# Patient Record
Sex: Female | Born: 1950 | Race: White | Hispanic: No | Smoking: Never smoker
Health system: Southern US, Community
[De-identification: ages and names within clinical notes are randomized; demographics above are authoritative.]

## PROBLEM LIST (undated history)

## (undated) DIAGNOSIS — M25562 Pain in left knee: Secondary | ICD-10-CM

## (undated) DIAGNOSIS — J449 Chronic obstructive pulmonary disease, unspecified: Secondary | ICD-10-CM

## (undated) DIAGNOSIS — R002 Palpitations: Secondary | ICD-10-CM

## (undated) DIAGNOSIS — J302 Other seasonal allergic rhinitis: Secondary | ICD-10-CM

## (undated) DIAGNOSIS — R600 Localized edema: Secondary | ICD-10-CM

## (undated) DIAGNOSIS — M1990 Unspecified osteoarthritis, unspecified site: Secondary | ICD-10-CM

## (undated) DIAGNOSIS — Z8669 Personal history of other diseases of the nervous system and sense organs: Secondary | ICD-10-CM

## (undated) DIAGNOSIS — K219 Gastro-esophageal reflux disease without esophagitis: Secondary | ICD-10-CM

## (undated) DIAGNOSIS — R51 Headache: Secondary | ICD-10-CM

## (undated) DIAGNOSIS — M25561 Pain in right knee: Secondary | ICD-10-CM

## (undated) DIAGNOSIS — Z87898 Personal history of other specified conditions: Secondary | ICD-10-CM

## (undated) DIAGNOSIS — G8929 Other chronic pain: Secondary | ICD-10-CM

## (undated) DIAGNOSIS — I639 Cerebral infarction, unspecified: Secondary | ICD-10-CM

## (undated) DIAGNOSIS — I83893 Varicose veins of bilateral lower extremities with other complications: Secondary | ICD-10-CM

## (undated) DIAGNOSIS — G4733 Obstructive sleep apnea (adult) (pediatric): Secondary | ICD-10-CM

## (undated) DIAGNOSIS — I10 Essential (primary) hypertension: Secondary | ICD-10-CM

## (undated) DIAGNOSIS — I712 Thoracic aortic aneurysm, without rupture: Secondary | ICD-10-CM

## (undated) DIAGNOSIS — F419 Anxiety disorder, unspecified: Secondary | ICD-10-CM

## (undated) DIAGNOSIS — M549 Dorsalgia, unspecified: Secondary | ICD-10-CM

## (undated) HISTORY — DX: Palpitations: R00.2

## (undated) HISTORY — DX: Pain in left knee: M25.562

## (undated) HISTORY — DX: Personal history of other specified conditions: Z87.898

## (undated) HISTORY — PX: ABDOMINAL HYSTERECTOMY: SHX81

## (undated) HISTORY — DX: Personal history of other diseases of the nervous system and sense organs: Z86.69

## (undated) HISTORY — PX: BACK SURGERY: SHX140

## (undated) HISTORY — DX: Varicose veins of bilateral lower extremities with other complications: I83.893

## (undated) HISTORY — DX: Other seasonal allergic rhinitis: J30.2

## (undated) HISTORY — PX: UMBILICAL HERNIA REPAIR: SHX196

## (undated) HISTORY — DX: Thoracic aortic aneurysm, without rupture: I71.2

## (undated) HISTORY — PX: CHOLECYSTECTOMY: SHX55

## (undated) HISTORY — DX: Pain in right knee: M25.561

## (undated) HISTORY — PX: APPENDECTOMY: SHX54

---

## 2004-05-15 ENCOUNTER — Emergency Department (HOSPITAL_COMMUNITY): Admission: EM | Admit: 2004-05-15 | Discharge: 2004-05-15 | Payer: Self-pay | Admitting: Emergency Medicine

## 2004-09-30 ENCOUNTER — Ambulatory Visit (HOSPITAL_COMMUNITY): Admission: RE | Admit: 2004-09-30 | Discharge: 2004-09-30 | Payer: Self-pay | Admitting: General Surgery

## 2004-10-11 ENCOUNTER — Inpatient Hospital Stay (HOSPITAL_COMMUNITY): Admission: RE | Admit: 2004-10-11 | Discharge: 2004-10-13 | Payer: Self-pay | Admitting: General Surgery

## 2007-09-19 HISTORY — PX: HERNIA REPAIR: SHX51

## 2007-11-25 ENCOUNTER — Inpatient Hospital Stay (HOSPITAL_COMMUNITY): Admission: RE | Admit: 2007-11-25 | Discharge: 2007-11-27 | Payer: Self-pay | Admitting: General Surgery

## 2011-01-31 NOTE — Op Note (Signed)
[name], Sabrina Curry                ACCOUNT NO.:  [id number]   MEDICAL RECORD NO.:  [id number]          PATIENT TYPE:  AMB   LOCATION:  DAY                           FACILITY:  [facility]   PHYSICIAN:  Jamesetta So, M.D.  DATE OF BIRTH:  1951-05-01   DATE OF PROCEDURE:  11/25/2007  DATE OF DISCHARGE:                               OPERATIVE REPORT   PREOPERATIVE DIAGNOSIS:  Recurrent incisional hernia.   POSTOPERATIVE DIAGNOSIS:  Recurrent incisional hernia.   PROCEDURE:  Recurrent incisional herniorrhaphy with mesh.   SURGEON:  Dr. Aviva Signs.   ANESTHESIA:  General endotracheal.   INDICATIONS:  The patient is a 60 year old white female who has sleep  apnea and morbid obesity who presents with recurrent incisional hernia.  The risks and benefits of the procedure including bleeding, infection,  cardiopulmonary difficulties, the possibly of recurrence of the hernia  were fully explained to the patient, who gave informed consent.   PROCEDURE NOTE:  The patient was placed in supine position.  After  induction of general endotracheal anesthesia, the abdomen was prepped  and draped in the usual sterile technique with Betadine.  Surgical site  confirmation was performed.   An upper midline incision was made through the previous surgical scar.  This was taken down to the mesh.  The patient was noted to have two  areas of herniation along the left lateral aspect of the mesh.  The old  polypropylene mesh was removed.  A single loop of small bowel was noted  adherent to this.  This was freed away sharply without difficulty.  Small serosal tear was then repaired using a 3-0 silk suture.  The  defect itself measured approximately 10 x 8 cm at its greatest diameter.  A 10 x 15 cm Proceed mesh was then placed.  2-0 and 0 Novofil tacking  sutures were placed into the mesh.  Interrupted 0 Novofil sutures were  then placed to secure the fascia over the mesh.  The subcutaneous layer  was  reapproximated using a 2-0 Vicryl interrupted suture.  The skin was  closed using staples.  Betadine ointment dry sterile dressings were  applied.  0.5 cm Sensorcaine was instilled in the surrounding wound.  An  abdominal binder was then applied.   All tape and needle counts were correct at the end of the procedure.  The patient was extubated in the operating room and went back to  recovery room awake in stable condition.  Complications none.   SPECIMEN:  None.   BLOOD LOSS:  Minimal.      Jamesetta So, M.D.  Electronically Signed     MAJ/MEDQ  D:  11/25/2007  T:  11/26/2007  Job:  [id number]   cc:   Stoney Bang  Fax: [phone]

## 2011-01-31 NOTE — Discharge Summary (Signed)
NAMECERA, RORKE                ACCOUNT NO.:  [id number]   MEDICAL RECORD NO.:  [id number]          PATIENT TYPE:  INP   LOCATION:  A302                          FACILITY:  [facility]   PHYSICIAN:  Jamesetta So, M.D.  DATE OF BIRTH:  1950-11-02   DATE OF ADMISSION:  11/25/2007  DATE OF DISCHARGE:  03/11/2009LH                               [place] COURSE SUMMARY:  The patient is a 60 year old white female who  presented to the operating room for repair of a recurrent incisional  hernia.  She underwent a recurrent incisional herniorrhaphy with mesh on  November 25, 2007.  She tolerated the procedure well.  Her postoperative  course was remarkable for some nausea and vomiting.  She thus was  admitted for treatment of this.  Her nausea and vomiting have  subsequently resolved and she is being discharged home on November 27, 2007, in good and improving condition.   DISCHARGE INSTRUCTIONS:  The patient is to follow up Dr. Aviva Signs on  December 05, 2007.   DISCHARGE MEDICATIONS:  1. Percocet 7.5/325 mg 1-2 tablets p.o. q.4 h. p.r.n. pain.  2. Metformin 500 mg p.o. b.i.d.  3. Xanax 1 mg p.o. t.i.d.  4. Albuterol inhalers as needed.  5. Amlodipine/benazepril 10/20 mg p.o. daily.  6. Morphine sulfate 30 mg p.o. b.i.d.  7. Omeprazole 20 mg p.o. b.i.d.   PRINCIPAL DIAGNOSES:  1. Recurrent incisional hernia.  2. Morbid obesity.  3. Chronic obstructive pulmonary disease.  4. Non-insulin-dependent diabetes mellitus.  5. Hypertension.   PRINCIPAL PROCEDURE:  Recurrent incisional herniorrhaphy with mesh on  November 25, 2007.      Jamesetta So, M.D.  Electronically Signed    MAJ/MEDQ  D:  11/27/2007  T:  11/28/2007  Job:  [id number]   cc:   Stoney Bang  Fax: [phone]

## 2011-02-03 NOTE — Op Note (Signed)
Sabrina Curry, Sabrina Curry                ACCOUNT NO.:  [id number]   MEDICAL RECORD NO.:  [id number]          PATIENT TYPE:  AMB   LOCATION:  DAY                           FACILITY:  [facility]   PHYSICIAN:  Jamesetta So, M.D.  DATE OF BIRTH:  18-Oct-1950   DATE OF PROCEDURE:  10/10/2004  DATE OF DISCHARGE:                                 OPERATIVE REPORT   PREOPERATIVE DIAGNOSIS:  Incisional hernia.   POSTOPERATIVE DIAGNOSIS:  Incisional hernia.   PROCEDURE:  Incisional herniorrhaphy with mesh.   SURGEON:  Dr. Aviva Signs   ANESTHESIA:  General endotracheal.   INDICATIONS:  The patient is a 60 year old morbidly obese white female with  multiple medical problems who has had multiple abdominal surgeries in the  past. She now presents with a painful incisional hernia above the umbilicus.  Risks and benefits of procedure including bleeding, infection, and  recurrence of hernia were fully explained to the patient, who gave informed  consent.   PROCEDURE NOTE:  The patient was placed in supine position. After induction  of general endotracheal anesthesia, the abdomen was prepped and draped using  the usual sterile technique with Betadine. Surgical site confirmation was  performed.   The right paramedian supraumbilical incision was made. This was made through  the previous surgical scar. This was taken down to the fascia. Multiple  small hernia defects were noted. These were all made into one. This then  made the hernia proximately 6-7 cm in its greatest diameter. Omentum was  present just deep to the mesh. Polypropylene mesh was then used for the  herniorrhaphy.  It was secured circumferentially to the fascia using a 0  Prolene running suture. Multiple stay sutures of 0 Prolene were also placed.  The subcutaneous layer was reapproximated using a 2-0 Vicryl interrupted  suture. The skin was closed using staples. 0.5% Sensorcaine was instilled in  the surrounding wound. Betadine ointment, dry  sterile dressing were applied.   All table and needle counts correct and the procedure. The patient was  extubated in the upper room and went back to recovery room awake in stable  condition.   COMPLICATIONS:  None.   SPECIMEN:  None.   BLOOD LOSS:  Minimal.                                               ______________________________  Jamesetta So, M.D.  Electronically Signed 10/10/2004 15:10:31 EST    MAJ/MEDQ  D:  10/10/2004  T:  10/10/2004  Job:  [id number]

## 2011-02-03 NOTE — H&P (Signed)
Sabrina Curry, Sabrina Curry                ACCOUNT NO.:  [id number]   MEDICAL RECORD NO.:  [id number]          PATIENT TYPE:  AMB   LOCATION:  DAY                           FACILITY:  [facility]   PHYSICIAN:  Jamesetta So, M.D.  DATE OF BIRTH:  June 15, 1951   DATE OF ADMISSION:  11/25/2007  DATE OF DISCHARGE:  LH                              HISTORY & PHYSICAL   CHIEF COMPLAINT:  Recurrent incisional hernia.   HISTORY OF PRESENT ILLNESS:  The patient is a 60 year old white female  who has had multiple abdominal surgeries in the past, who now presents  with a recurrent incisional hernia superior to the umbilicus.  She has  had increasing pain in this region.  The hernia is made worse with  straining.  No nausea or vomiting have been noted.   PAST MEDICAL HISTORY:  1. Sleep apnea.  2. COPD.  3. Noninsulin-dependent diabetes mellitus.  4. Hypertension.  5. Morbid obesity.   PAST SURGICAL HISTORY:  1. Multiple incisional herniorrhaphies.  2. Cholecystectomy.  3. Back surgery.  4. Hysterectomy.  5. Small bowel injury due to a laparoscopy in the past.   CURRENT MEDICATIONS:  Metformin, albuterol inhaler, CPAP machine,  hydrochlorothiazide, amlodipine, hydrocodone and morphine for chronic  pain.   ALLERGIES:  VANCOMYCIN.   REVIEW OF SYSTEMS:  Noncontributory.   PHYSICAL EXAMINATION:  The patient is an obese white female in no acute  distress.  LUNGS:  Clear to auscultation with equal breath sounds bilaterally.  HEART:  A regular rate and rhythm without S3, S4 or murmurs.  ABDOMEN:  Large with a large supraumbilical hernia present extending  from the midline to the left, the previous incisional hernia.  It is  reducible.   IMPRESSION:  Recurrent incisional hernia.   PLAN:  The patient is scheduled for a recurrent incisional herniorrhaphy  with mesh on November 25, 2007.  The risks and benefits of the procedure  including bleeding, infection, pain, cardiopulmonary difficulties, and  the  possibility of recurrence of the hernia were fully explained to the  patient, who gave informed consent.      Jamesetta So, M.D.  Electronically Signed     MAJ/MEDQ  D:  11/07/2007  T:  11/08/2007  Job:  [id number]   cc:   [facility]  Fax: [place]  Fax: [phone]

## 2011-02-03 NOTE — Discharge Summary (Signed)
Sabrina Curry, Sabrina Curry                ACCOUNT NO.:  [id number]   MEDICAL RECORD NO.:  [id number]          PATIENT TYPE:  INP   LOCATION:  A311                          FACILITY:  [facility]   PHYSICIAN:  Jamesetta So, M.D.  DATE OF BIRTH:  01-27-1951   DATE OF ADMISSION:  10/10/2004  DATE OF DISCHARGE:  01/26/2006LH                                 [place] COURSE SUMMARY:  The patient is a 60 year old white female with  multiple medical problems who presented with an incisional hernia.  She  underwent an incisional herniorrhaphy with mesh on October 10, 2004.  She  tolerated the procedure well.  Her postoperative course was remarkable for  relative hypotension, given her preoperative hypertensive state.  Her blood  pressure medications were held.  A MET-7 and CBC were within normal limits.  A 12-lead EKG was within normal limits.  Her diet was advanced without  difficulty.  It was felt that her blood pressure was somewhat low due to  postoperative nausea, which has since resolved.  The patient is being  discharged home in good and improving condition.   DISCHARGE INSTRUCTIONS:  The patient is to follow up with Dr. Aviva Signs  on October 20, 2004.   DISCHARGE MEDICATIONS:  Include:  1.  Lortab 10/650 one tablet p.o. q.4h. p.r.n. pain.  2.  She is to resume all of her other medications except for her blood      pressure medications as prescribed.   PRINCIPAL DIAGNOSES:  1.  Incisional hernia.  2.  Non-insulin dependent diabetes mellitus.  3.  Hypertension.  4.  Morbid obesity.  5.  Anxiety.   PRINCIPAL PROCEDURE:  Incisional herniorrhaphy with mesh on October 10, 2004.      MAJ/MEDQ  D:  10/13/2004  T:  10/13/2004  Job:  [id number]

## 2011-02-03 NOTE — H&P (Signed)
NAMEAFNAN, CADIENTE                ACCOUNT NO.:  [id number]   MEDICAL RECORD NO.:  [id number]           PATIENT TYPE:   LOCATION:                                FACILITY:  [facility]   PHYSICIAN:  Jamesetta So, M.D.  DATE OF BIRTH:  Jul 23, 1951   DATE OF ADMISSION:  DATE OF DISCHARGE:  LH                                HISTORY & PHYSICAL   CHIEF COMPLAINT:  Ventral/incisional hernia.   HISTORY OF PRESENT ILLNESS:  The patient is a 60 year old white female who  has had multiple abdominal surgeries in the past who now presents with a  ventral hernia just superior to the umbilicus.  This is in close proximity  to a previous midline incision.  She has had increasing pain in this region.  A CT scan of the abdomen revealed a 3-cm fascial defect in this region.  It  is about 4- to -5-cm above the umbilicus and to the right of the midline.   PAST MEDICAL HISTORY:  1.  COPD.  2.  Sleep apnea.  3.  Non-insulin-dependent diabetes mellitus.  4.  Hypertension.  5.  Morbid obesity.   PAST SURGICAL HISTORY:  1.  Cholecystectomy.  2.  Multiple herniorrhaphies.  3.  Back surgery.  4.  Hysterectomy.  5.  Small bowel injury due to a laparoscopy.   CURRENT MEDICATIONS:  Alprazolam, albuterol inhaler, methadone, Lorcet,  Flonase, Prevacid, isosorbide, Norvasc, atenolol, Zyrtec, furosemide,  Altace, Metformin, Hyzaar, Singulair, clonidine.   ALLERGIES:  VANCOMYCIN.   REVIEW OF SYSTEMS:  The patient denies drinking or smoking.   PHYSICAL EXAMINATION:  GENERAL:  The patient is an obese white female in no  acute distress.  VITAL SIGNS:  She is afebrile and vital signs are stable.  LUNGS:  Clear to auscultation with equal breath sounds bilaterally.  HEART:  Reveals a regular rate and rhythm without S3, S4, or murmurs.  ABDOMEN:  Soft with nonspecific tenderness noted superior to the umbilicus.  A specific hernia defect can not be appreciated due to her obesity.  No  hepatosplenomegaly or masses  noted.   IMPRESSION:  Ventral/incisional hernia.   PLAN:  The patient is scheduled to undergo a ventral/incisional  herniorrhaphy with mesh on October 10, 2004.  The risks and benefits of the  procedure including bleeding, infection, and the possibility of recurrence  of the hernia were fully explained to the patient.  Gave informed consent.      MAJ/MEDQ  D:  10/04/2004  T:  10/04/2004  Job:  [id number]   cc:   [facility]  Fax: [phone]

## 2011-02-03 NOTE — H&P (Signed)
Sabrina Curry, Sabrina Curry                ACCOUNT NO.:  [id number]   MEDICAL RECORD NO.:  [id number]          PATIENT TYPE:  AMB   LOCATION:  DAY                           FACILITY:  [facility]   PHYSICIAN:  Jamesetta So, M.D.  DATE OF BIRTH:  Dec 03, 1950   DATE OF ADMISSION:  11/25/2007  DATE OF DISCHARGE:  LH                              HISTORY & PHYSICAL   CHIEF COMPLAINT:  Recurrent incisional hernia.   HISTORY OF PRESENT ILLNESS:  The patient is a 60 year old white female  who has had multiple abdominal surgeries in the past, who now presents  with a recurrent incisional hernia superior to the umbilicus.  She has  had increasing pain in this region.  The hernia is made worse with  straining.  No nausea or vomiting have been noted.   PAST MEDICAL HISTORY:  1. Sleep apnea.  2. COPD.  3. Noninsulin-dependent diabetes mellitus.  4. Hypertension.  5. Morbid obesity.   PAST SURGICAL HISTORY:  1. Multiple incisional herniorrhaphies.  2. Cholecystectomy.  3. Back surgery.  4. Hysterectomy.  5. Small bowel injury due to a laparoscopy in the past.   CURRENT MEDICATIONS:  Metformin, albuterol inhaler, CPAP machine,  hydrochlorothiazide, amlodipine, hydrocodone and morphine for chronic  pain.   ALLERGIES:  VANCOMYCIN.   REVIEW OF SYSTEMS:  Noncontributory.   PHYSICAL EXAMINATION:  The patient is an obese white female in no acute  distress.  LUNGS:  Clear to auscultation with equal breath sounds bilaterally.  HEART:  A regular rate and rhythm without S3, S4 or murmurs.  ABDOMEN:  Large with a large supraumbilical hernia present extending  from the midline to the left, the previous incisional hernia.  It is  reducible.   IMPRESSION:  Recurrent incisional hernia.   PLAN:  The patient is scheduled for a recurrent incisional herniorrhaphy  with mesh on November 25, 2007.  The risks and benefits of the procedure  including bleeding, infection, pain, cardiopulmonary difficulties, and  the  possibility of recurrence of the hernia were fully explained to the  patient, who gave informed consent.      Jamesetta So, M.D.  Electronically Signed     MAJ/MEDQ  D:  11/07/2007  T:  11/08/2007  Job:  [id number]   cc:   [facility]  Fax: [place]  Fax: [phone]

## 2011-06-12 LAB — CBC
HCT: 36.8
MCV: 90.5
Platelets: 150
Platelets: 188
RDW: 13.6
WBC: 9.5

## 2011-06-12 LAB — DIFFERENTIAL
Lymphocytes Relative: 21
Lymphs Abs: 2
Neutro Abs: 6.7
Neutrophils Relative %: 71

## 2011-06-12 LAB — BASIC METABOLIC PANEL
BUN: 13
BUN: 9
Creatinine, Ser: 0.81
Creatinine, Ser: 0.87
GFR calc non Af Amer: >60
GFR calc non Af Amer: >60
Glucose, Bld: 97
Potassium: 3.8
Potassium: 4

## 2012-01-11 ENCOUNTER — Encounter (HOSPITAL_COMMUNITY): Payer: Self-pay | Admitting: Pharmacy Technician

## 2012-01-12 ENCOUNTER — Encounter (HOSPITAL_COMMUNITY)
Admission: RE | Admit: 2012-01-12 | Discharge: 2012-01-12 | Disposition: A | Discharge status: Home / Self Care | Payer: Medicaid Other | Source: Ambulatory Visit | Attending: Ophthalmology | Admitting: Ophthalmology

## 2012-01-12 ENCOUNTER — Encounter (HOSPITAL_COMMUNITY): Payer: Self-pay

## 2012-01-12 HISTORY — DX: Dorsalgia, unspecified: M54.9

## 2012-01-12 HISTORY — DX: Anxiety disorder, unspecified: F41.9

## 2012-01-12 HISTORY — DX: Headache: R51

## 2012-01-12 HISTORY — DX: Gastro-esophageal reflux disease without esophagitis: K21.9

## 2012-01-12 HISTORY — DX: Unspecified osteoarthritis, unspecified site: M19.90

## 2012-01-12 HISTORY — DX: Other chronic pain: G89.29

## 2012-01-12 LAB — HEMOGLOBIN AND HEMATOCRIT, BLOOD
HCT: 44.5 % (ref 36.0–46.0)
Hemoglobin: 14.3 g/dL (ref 12.0–15.0)

## 2012-01-12 LAB — BASIC METABOLIC PANEL
Chloride: 105 mEq/L (ref 96–112)
Creatinine, Ser: 0.84 mg/dL (ref 0.50–1.10)
GFR calc Af Amer: 87 mL/min — ABNORMAL LOW (ref >90)
Potassium: 4.1 mEq/L (ref 3.5–5.1)

## 2012-01-12 NOTE — Patient Instructions (Signed)
Your procedure is scheduled on:  Tuesday, 01/16/12  Report to [facility] at  1000   AM.  Call this number if you have problems the morning of surgery: [phone]   Remember:   Do not eat or drink   After Midnight.  Take these medicines the morning of surgery with A SIP OF WATER: xanax, lotrel, lorcet or MScontin, and albuterol. Please bring your inhaler.   Do not wear jewelry, make-up or nail polish.  Do not wear lotions, powders, or perfumes. You may wear deodorant.  Do not bring valuables to the hospital.  Contacts, dentures or bridgework may not be worn into surgery.     Patients discharged the day of surgery will not be allowed to drive home.  Name and phone number of your driver: driver  Special Instructions: Use eye drops as directed.   Please read over the following fact sheets that you were given: Pain Booklet, Anesthesia Post-op Instructions and Care and Recovery After Surgery    Cataract Surgery  A cataract is a clouding of the lens of the eye. When a lens becomes cloudy, vision is reduced based on the degree and nature of the clouding. Surgery may be needed to improve vision. Surgery removes the cloudy lens and usually replaces it with a substitute lens (intraocular lens, IOL). LET YOUR EYE DOCTOR KNOW ABOUT:  Allergies to food or medicine.   Medicines taken including herbs, eyedrops, over-the-counter medicines, and creams.   Use of steroids (by mouth or creams).   Previous problems with anesthetics or numbing medicine.   History of bleeding problems or blood clots.   Previous surgery.   Other health problems, including diabetes and kidney problems.   Possibility of pregnancy, if this applies.  RISKS AND COMPLICATIONS  Infection.   Inflammation of the eyeball (endophthalmitis) that can spread to both eyes (sympathetic ophthalmia).   Poor wound healing.   If an IOL is inserted, it can later fall out of proper position. This is very uncommon.   Clouding of the  part of your eye that holds an IOL in place. This is called an "after-cataract." These are uncommon, but easily treated.  BEFORE THE PROCEDURE  Do not eat or drink anything except small amounts of water for 8 to 12 before your surgery, or as directed by your caregiver.   Unless you are told otherwise, continue any eyedrops you have been prescribed.   Talk to your primary caregiver about all other medicines that you take (both prescription and non-prescription). In some cases, you may need to stop or change medicines near the time of your surgery. This is most important if you are taking blood-thinning medicine.Do not stop medicines unless you are told to do so.   Arrange for someone to drive you to and from the procedure.   Do not put contact lenses in either eye on the day of your surgery.  PROCEDURE There is more than one method for safely removing a cataract. Your doctor can explain the differences and help determine which is best for you. Phacoemulsification surgery is the most common form of cataract surgery.  An injection is given behind the eye or eyedrops are given to make this a painless procedure.   A small cut (incision) is made on the edge of the clear, dome-shaped surface that covers the front of the eye (cornea).   A tiny probe is painlessly inserted into the eye. This device gives off ultrasound waves that soften and break up the cloudy  center of the lens. This makes it easier for the cloudy lens to be removed by suction.   An IOL may be implanted.   The normal lens of the eye is covered by a clear capsule. Part of that capsule is intentionally left in the eye to support the IOL.   Your surgeon may or may not use stitches to close the incision.  There are other forms of cataract surgery that require a larger incision and stiches to close the eye. This approach is taken in cases where the doctor feels that the cataract cannot be easily removed using  phacoemulsification. AFTER THE PROCEDURE  When an IOL is implanted, it does not need care. It becomes a permanent part of your eye and cannot be seen or felt.   Your doctor will schedule follow-up exams to check on your progress.   Review your other medicines with your doctor to see which can be resumed after surgery.   Use eyedrops or take medicine as prescribed by your doctor.  Document Released: 08/24/2011 Document Reviewed: 08/21/2011 [facility] Patient Information 2012 [employer].  PATIENT INSTRUCTIONS POST-ANESTHESIA  IMMEDIATELY FOLLOWING SURGERY:  Do not drive or operate machinery for the first twenty four hours after surgery.  Do not make any important decisions for twenty four hours after surgery or while taking narcotic pain medications or sedatives.  If you develop intractable nausea and vomiting or a severe headache please notify your doctor immediately.  FOLLOW-UP:  Please make an appointment with your surgeon as instructed. You do not need to follow up with anesthesia unless specifically instructed to do so.  WOUND CARE INSTRUCTIONS (if applicable):  Keep a dry clean dressing on the anesthesia/puncture wound site if there is drainage.  Once the wound has quit draining you may leave it open to air.  Generally you should leave the bandage intact for twenty four hours unless there is drainage.  If the epidural site drains for more than 36-48 hours please call the anesthesia department.  QUESTIONS?:  Please feel free to call your physician or the hospital operator if you have any questions, and they will be happy to assist you.

## 2012-01-15 MED ORDER — CYCLOPENTOLATE-PHENYLEPHRINE 0.2-1 % OP SOLN
OPHTHALMIC | Status: AC
  Administered 2012-01-16: 1 [drp] via OPHTHALMIC
  Filled 2012-01-15: qty 2

## 2012-01-15 MED ORDER — TETRACAINE HCL 0.5 % OP SOLN
OPHTHALMIC | Status: AC
  Administered 2012-01-16: 1 [drp] via OPHTHALMIC
  Filled 2012-01-15: qty 2

## 2012-01-15 MED ORDER — FLURBIPROFEN SODIUM 0.03 % OP SOLN
OPHTHALMIC | Status: AC
  Administered 2012-01-16: 1 [drp] via OPHTHALMIC
  Filled 2012-01-15: qty 2.5

## 2012-01-15 MED ORDER — PHENYLEPHRINE HCL 2.5 % OP SOLN
OPHTHALMIC | Status: AC
  Administered 2012-01-16: 1 [drp] via OPHTHALMIC
  Filled 2012-01-15: qty 2

## 2012-01-16 ENCOUNTER — Ambulatory Visit (HOSPITAL_COMMUNITY): Payer: Medicaid Other

## 2012-01-16 ENCOUNTER — Encounter (HOSPITAL_COMMUNITY): Payer: Self-pay

## 2012-01-16 ENCOUNTER — Encounter (HOSPITAL_COMMUNITY)
Admission: RE | Disposition: A | Discharge status: Home / Self Care | Payer: Self-pay | Source: Ambulatory Visit | Attending: Ophthalmology

## 2012-01-16 ENCOUNTER — Ambulatory Visit (HOSPITAL_COMMUNITY)
Admission: RE | Admit: 2012-01-16 | Discharge: 2012-01-16 | Disposition: A | Discharge status: Home / Self Care | Payer: Medicaid Other | Source: Ambulatory Visit | Attending: Ophthalmology | Admitting: Ophthalmology

## 2012-01-16 DIAGNOSIS — H2510 Age-related nuclear cataract, unspecified eye: Secondary | ICD-10-CM | POA: Insufficient documentation

## 2012-01-16 DIAGNOSIS — I10 Essential (primary) hypertension: Secondary | ICD-10-CM | POA: Insufficient documentation

## 2012-01-16 DIAGNOSIS — Z01812 Encounter for preprocedural laboratory examination: Secondary | ICD-10-CM | POA: Insufficient documentation

## 2012-01-16 DIAGNOSIS — Z79899 Other long term (current) drug therapy: Secondary | ICD-10-CM | POA: Insufficient documentation

## 2012-01-16 HISTORY — PX: CATARACT EXTRACTION W/PHACO: SHX586

## 2012-01-16 SURGERY — PHACOEMULSIFICATION, CATARACT, WITH IOL INSERTION
Anesthesia: Monitor Anesthesia Care | Site: Eye | Laterality: Right | Wound class: Clean

## 2012-01-16 MED ORDER — PHENYLEPHRINE HCL 2.5 % OP SOLN
1.0000 [drp] | OPHTHALMIC | Status: AC
  Administered 2012-01-16 (×3): 1 [drp] via OPHTHALMIC

## 2012-01-16 MED ORDER — FLURBIPROFEN SODIUM 0.03 % OP SOLN
1.0000 [drp] | OPHTHALMIC | Status: AC
  Administered 2012-01-16 (×3): 1 [drp] via OPHTHALMIC

## 2012-01-16 MED ORDER — LIDOCAINE HCL 3.5 % OP GEL: 1.0000 "application " | Freq: Once | OPHTHALMIC | Status: DC

## 2012-01-16 MED ORDER — PROVISC 10 MG/ML IO SOLN
INTRAOCULAR | Status: DC | PRN
  Administered 2012-01-16: 8.5 mg via INTRAOCULAR

## 2012-01-16 MED ORDER — FENTANYL CITRATE 0.05 MG/ML IJ SOLN: 25.0000 ug | INTRAMUSCULAR | Status: DC | PRN

## 2012-01-16 MED ORDER — EPINEPHRINE HCL 1 MG/ML IJ SOLN
INTRAMUSCULAR | Status: AC
  Filled 2012-01-16: qty 1

## 2012-01-16 MED ORDER — TETRACAINE HCL 0.5 % OP SOLN
1.0000 [drp] | OPHTHALMIC | Status: AC
  Administered 2012-01-16 (×3): 1 [drp] via OPHTHALMIC

## 2012-01-16 MED ORDER — LIDOCAINE 3.5 % OP GEL OPTIME - NO CHARGE
OPHTHALMIC | Status: DC | PRN
  Administered 2012-01-16: 2 [drp] via OPHTHALMIC

## 2012-01-16 MED ORDER — MIDAZOLAM HCL 2 MG/2ML IJ SOLN
INTRAMUSCULAR | Status: AC
  Administered 2012-01-16: 2 mg via INTRAVENOUS
  Filled 2012-01-16: qty 2

## 2012-01-16 MED ORDER — MIDAZOLAM HCL 2 MG/2ML IJ SOLN
1.0000 mg | INTRAMUSCULAR | Status: DC | PRN
  Administered 2012-01-16: 2 mg via INTRAVENOUS

## 2012-01-16 MED ORDER — FLURBIPROFEN SODIUM 0.03 % OP SOLN
OPHTHALMIC | Status: AC
  Administered 2012-01-16: 1 [drp] via OPHTHALMIC
  Filled 2012-01-16: qty 2.5

## 2012-01-16 MED ORDER — ONDANSETRON HCL 4 MG/2ML IJ SOLN: 4.0000 mg | Freq: Once | INTRAMUSCULAR | Status: DC | PRN

## 2012-01-16 MED ORDER — TETRACAINE 0.5 % OP SOLN OPTIME - NO CHARGE
OPHTHALMIC | Status: DC | PRN
  Administered 2012-01-16: 2 [drp] via OPHTHALMIC

## 2012-01-16 MED ORDER — EPINEPHRINE HCL 1 MG/ML IJ SOLN
INTRAOCULAR | Status: DC | PRN
  Administered 2012-01-16: 11:00:00

## 2012-01-16 MED ORDER — BSS IO SOLN
INTRAOCULAR | Status: DC | PRN
  Administered 2012-01-16: 15 mL via INTRAOCULAR

## 2012-01-16 MED ORDER — CYCLOPENTOLATE-PHENYLEPHRINE 0.2-1 % OP SOLN
1.0000 [drp] | OPHTHALMIC | Status: AC
  Administered 2012-01-16 (×3): 1 [drp] via OPHTHALMIC

## 2012-01-16 MED ORDER — LACTATED RINGERS IV SOLN
INTRAVENOUS | Status: DC
  Administered 2012-01-16: 11:00:00 via INTRAVENOUS

## 2012-01-16 SURGICAL SUPPLY — 23 items
CAPSULAR TENSION RING-AMO (OPHTHALMIC RELATED) IMPLANT
CLOTH BEACON ORANGE TIMEOUT ST (SAFETY) ×1 IMPLANT
EYE SHIELD UNIVERSAL CLEAR (GAUZE/BANDAGES/DRESSINGS) ×2 IMPLANT
GLOVE BIO SURGEON STRL SZ 6.5 (GLOVE) ×1 IMPLANT
GLOVE ECLIPSE 6.5 STRL STRAW (GLOVE) IMPLANT
GLOVE ECLIPSE 7.0 STRL STRAW (GLOVE) IMPLANT
GLOVE EXAM NITRILE LRG STRL (GLOVE) IMPLANT
GLOVE EXAM NITRILE MD LF STRL (GLOVE) ×1 IMPLANT
GLOVE SKINSENSE NS SZ6.5 (GLOVE)
GLOVE SKINSENSE STRL SZ6.5 (GLOVE) IMPLANT
HEALON 5 0.6 ML (INTRAOCULAR LENS) IMPLANT
KIT VITRECTOMY (OPHTHALMIC RELATED) IMPLANT
PAD ARMBOARD 7.5X6 YLW CONV (MISCELLANEOUS) ×1 IMPLANT
PROC W NO LENS (INTRAOCULAR LENS)
PROC W SPEC LENS (INTRAOCULAR LENS)
PROCESS W NO LENS (INTRAOCULAR LENS) IMPLANT
PROCESS W SPEC LENS (INTRAOCULAR LENS) IMPLANT
RING MALYGIN (MISCELLANEOUS) IMPLANT
SIGHTPATH CAT PROC W REG LENS (Ophthalmic Related) ×2 IMPLANT
TAPE SURG TRANSPORE 1 IN (GAUZE/BANDAGES/DRESSINGS) IMPLANT
TAPE SURGICAL TRANSPORE 1 IN (GAUZE/BANDAGES/DRESSINGS) ×1
VISCOELASTIC ADDITIONAL (OPHTHALMIC RELATED) IMPLANT
WATER STERILE IRR 250ML POUR (IV SOLUTION) ×1 IMPLANT

## 2012-01-16 NOTE — Anesthesia Postprocedure Evaluation (Signed)
  Anesthesia Post-op Note  Patient: Sabrina Curry  Procedure(s) Performed: Procedure(s) (LRB): CATARACT EXTRACTION PHACO AND INTRAOCULAR LENS PLACEMENT (IOC) (Right)  Patient Location: PACU and Short Stay  Anesthesia Type: MAC  Level of Consciousness: awake, alert , oriented and patient cooperative  Airway and Oxygen Therapy: Patient Spontanous Breathing  Post-op Pain: 2 /10, mild  Post-op Assessment: Post-op Vital signs reviewed, Patient's Cardiovascular Status Stable, Respiratory Function Stable, Patent Airway, No signs of Nausea or vomiting and Pain level controlled  Post-op Vital Signs: Reviewed and stable  Complications: No apparent anesthesia complications

## 2012-01-16 NOTE — H&P (Signed)
The patient was re examined and there is no change in the patients condition since the original H and P. 

## 2012-01-16 NOTE — Anesthesia Preprocedure Evaluation (Signed)
Anesthesia Evaluation  Patient identified by MRN, date of birth, ID band Patient awake    Reviewed: Allergy & Precautions, H&P , NPO status , Patient's Chart, lab work & pertinent test results  History of Anesthesia Complications Negative for: history of anesthetic complications  Airway Mallampati: II      Dental  (+) Teeth Intact   Pulmonary asthma ,  breath sounds clear to auscultation        Cardiovascular hypertension, Pt. on medications Rhythm:Regular     Neuro/Psych  Headaches, Anxiety    GI/Hepatic GERD-  Medicated and Controlled,  Endo/Other    Renal/GU      Musculoskeletal   Abdominal   Peds  Hematology   Anesthesia Other Findings   Reproductive/Obstetrics                           Anesthesia Physical Anesthesia Plan  ASA: II  Anesthesia Plan: MAC   Post-op Pain Management:    Induction: Intravenous  Airway Management Planned: Nasal Cannula  Additional Equipment:   Intra-op Plan:   Post-operative Plan:   Informed Consent: I have reviewed the patients History and Physical, chart, labs and discussed the procedure including the risks, benefits and alternatives for the proposed anesthesia with the patient or authorized representative who has indicated his/her understanding and acceptance.     Plan Discussed with:   Anesthesia Plan Comments:         Anesthesia Quick Evaluation

## 2012-01-16 NOTE — Op Note (Signed)
Patient brought to the operating room and prepped and draped in the usual manner.  Lid speculum inserted in right eye.  Stab incision made at the twelve o'clock position.  Provisc instilled in the anterior chamber.   A 2.4 mm. Stab incision was made temporally.  An anterior capsulotomy was done with a bent 25 gauge needle.  The nucleus was hydrodissected.  The Phaco tip was inserted in the anterior chamber and the nucleus was emulsified.  CDE was 6.12.  The cortical material was then removed with the I and A tip.  Posterior capsule was the polished.  The anterior chamber was deepened with Provisc.  A 29.0 Diopter Rayner 570C IOL was then inserted in the capsular bag.  Provisc was then removed with the I and A tip.  The wound was then hydrated.  Patient sent to the Recovery Room in good condition with follow up in my office.

## 2012-01-16 NOTE — Transfer of Care (Signed)
Immediate Anesthesia Transfer of Care Note  Patient: Sabrina Curry  Procedure(s) Performed: Procedure(s) (LRB): CATARACT EXTRACTION PHACO AND INTRAOCULAR LENS PLACEMENT (IOC) (Right)  Patient Location: PACU and Short Stay  Anesthesia Type: MAC  Level of Consciousness: awake, alert , oriented and patient cooperative  Airway & Oxygen Therapy: Patient Spontanous Breathing  Post-op Assessment: Report given to PACU RN, Post -op Vital signs reviewed and stable and Patient moving all extremities  Post vital signs: Reviewed and stable  Complications: No apparent anesthesia complications

## 2012-01-16 NOTE — Discharge Instructions (Signed)
ELYNA PANGILINAN  01/16/2012           [facility] Instructions [place] [address]      1. Avoid closing eyes tightly. One often closes the eye tightly when laughing, talking, sneezing, coughing or if they feel irritated. At these times, you should be careful not to close your eyes tightly.  2. Instill one drop [place] in operative eye 3 times each day until the bottle is finished. To instill drops in your eye, open it, look up and have someone gently pull the lower lid down and instill a couple of drops inside the lower lid.  3. Do not touch upper lid.  4. Take Advil or Tylenol for pain.  5. You may use either eye for near work, such as reading or sewing and you may watch television.  6. You may have your hair done at the beauty parlor at any time.  7. Wear dark glasses with or without your own glasses if you are in bright light.  8. Call our office at [phone] or [phone] if you have sharp pain in your eye or unusual symptoms.  9. Do not be concerned because vision in the operative eye is not good. It will not be good, no matter how successful the operation, until you get a special lens for it. Your old glasses will not be suited to the new eye that was operated on and you will not be ready for a new lens for about a month.  10. Follow up at the [facility] office.    I have received a copy of the above instructions and will follow them.

## 2012-01-19 ENCOUNTER — Encounter (HOSPITAL_COMMUNITY): Payer: Self-pay | Admitting: Ophthalmology

## 2012-12-07 ENCOUNTER — Emergency Department (HOSPITAL_COMMUNITY): Payer: Medicaid Other

## 2012-12-07 ENCOUNTER — Inpatient Hospital Stay (HOSPITAL_COMMUNITY)
Admission: EM | Admit: 2012-12-07 | Discharge: 2012-12-09 | DRG: 309 | Disposition: A | Discharge status: Home / Self Care | Payer: Medicaid Other | Attending: Internal Medicine | Admitting: Internal Medicine

## 2012-12-07 ENCOUNTER — Encounter (HOSPITAL_COMMUNITY): Payer: Self-pay

## 2012-12-07 DIAGNOSIS — I471 Supraventricular tachycardia: Secondary | ICD-10-CM

## 2012-12-07 DIAGNOSIS — F411 Generalized anxiety disorder: Secondary | ICD-10-CM

## 2012-12-07 DIAGNOSIS — I10 Essential (primary) hypertension: Secondary | ICD-10-CM | POA: Diagnosis present

## 2012-12-07 DIAGNOSIS — I4891 Unspecified atrial fibrillation: Secondary | ICD-10-CM | POA: Diagnosis present

## 2012-12-07 DIAGNOSIS — Z6841 Body Mass Index (BMI) 40.0 and over, adult: Secondary | ICD-10-CM

## 2012-12-07 DIAGNOSIS — G8929 Other chronic pain: Secondary | ICD-10-CM | POA: Diagnosis present

## 2012-12-07 DIAGNOSIS — I5189 Other ill-defined heart diseases: Secondary | ICD-10-CM

## 2012-12-07 DIAGNOSIS — M129 Arthropathy, unspecified: Secondary | ICD-10-CM | POA: Diagnosis present

## 2012-12-07 DIAGNOSIS — R002 Palpitations: Secondary | ICD-10-CM

## 2012-12-07 DIAGNOSIS — R55 Syncope and collapse: Secondary | ICD-10-CM

## 2012-12-07 DIAGNOSIS — K219 Gastro-esophageal reflux disease without esophagitis: Secondary | ICD-10-CM | POA: Diagnosis present

## 2012-12-07 DIAGNOSIS — I498 Other specified cardiac arrhythmias: Principal | ICD-10-CM | POA: Diagnosis present

## 2012-12-07 DIAGNOSIS — J45909 Unspecified asthma, uncomplicated: Secondary | ICD-10-CM

## 2012-12-07 DIAGNOSIS — G4730 Sleep apnea, unspecified: Secondary | ICD-10-CM | POA: Diagnosis present

## 2012-12-07 DIAGNOSIS — M549 Dorsalgia, unspecified: Secondary | ICD-10-CM | POA: Diagnosis present

## 2012-12-07 HISTORY — DX: Essential (primary) hypertension: I10

## 2012-12-07 HISTORY — DX: Obstructive sleep apnea (adult) (pediatric): G47.33

## 2012-12-07 LAB — COMPREHENSIVE METABOLIC PANEL
AST: 13 U/L (ref 0–37)
Albumin: 4 g/dL (ref 3.5–5.2)
BUN: 14 mg/dL (ref 6–23)
Creatinine, Ser: 0.89 mg/dL (ref 0.50–1.10)
Potassium: 3.6 mEq/L (ref 3.5–5.1)
Total Protein: 7.8 g/dL (ref 6.0–8.3)

## 2012-12-07 LAB — CBC WITH DIFFERENTIAL/PLATELET
Basophils Absolute: 0.1 10*3/uL (ref 0.0–0.1)
Basophils Relative: 1 % (ref 0–1)
Eosinophils Absolute: 0.1 10*3/uL (ref 0.0–0.7)
Hemoglobin: 14.8 g/dL (ref 12.0–15.0)
MCH: 31.4 pg (ref 26.0–34.0)
MCHC: 33.7 g/dL (ref 30.0–36.0)
Monocytes Absolute: 0.6 10*3/uL (ref 0.1–1.0)
Monocytes Relative: 6 % (ref 3–12)
Neutrophils Relative %: 61 % (ref 43–77)
RDW: 12.6 % (ref 11.5–15.5)

## 2012-12-07 LAB — TROPONIN I: Troponin I: <0.3 ng/mL (ref <0.30)

## 2012-12-07 MED ORDER — SODIUM CHLORIDE 0.9 % IV SOLN: INTRAVENOUS | Status: DC

## 2012-12-07 MED ORDER — DILTIAZEM HCL 100 MG IV SOLR
5.0000 mg/h | INTRAVENOUS | Status: DC
  Administered 2012-12-07: 5 mg/h via INTRAVENOUS
  Filled 2012-12-07: qty 100

## 2012-12-07 NOTE — ED Notes (Signed)
Pt stated she is feeling better, has voided x2 on bedpan since arrival.  Family members at bedside. resp even and unlabored.

## 2012-12-07 NOTE — ED Provider Notes (Signed)
History    This chart was scribed for Nat Christen, MD, by Joline Maxcy, ED scribe. The patient was seen in room APA01/APA01 and the patient's care was started at 1631.  CSN: [id number]  Arrival date & time 12/07/12  1622   First MD Initiated Contact with Patient 12/07/12 1631      Chief Complaint  Patient presents with  . Shortness of Breath  . Weakness    (Consider location/radiation/quality/duration/timing/severity/associated sxs/prior treatment) The history is provided by the patient, the EMS personnel and medical records. No language interpreter was used.    Sabrina Curry is a 62 y.o. female brought in by ambulance who presents to the Emergency Department complaining of sudden onset, intermittent SOB episodes that last anywhere from several seconds to minutes that are aggravated by movement and exertion with associated near-syncopal that began 2 months ago. She reports countless episodes in that time period, but states that the episode PTA was the worst. EMS states that she was in atrial fibulation and hypertensive upon arrival and was given a bolus of cardizem in route. In ED, she complains of weakness and fatigue. She has a h/o of hypertension and takes lopressor. She has a surgical h/o of a hysterectomy, cholecystectomy, back surgery, appendectomy, umbilical hernia repair, and cataract extraction. She  Is a current nonsmoker and denies ETOH use.  PCP is Dr. Sherrie Sport in [place].  Past Medical History  Diagnosis Date  . Asthma   . Anxiety   . GERD (gastroesophageal reflux disease)   . Headache   . Arthritis   . Chronic back pain   . Hypertension     Past Surgical History  Procedure Laterality Date  . Abdominal hysterectomy    . Cholecystectomy      [facility]  . Back surgery      had staff infection of back  . Umbilical hernia repair    . Hernia repair  2009    inscional hernia repair x2-3  . Appendectomy      with gallbladder removal  . Cataract extraction w/phaco   01/16/2012    Procedure: CATARACT EXTRACTION PHACO AND INTRAOCULAR LENS PLACEMENT (IOC);  Surgeon: Elta Guadeloupe T. Gershon Crane, MD;  Location: [facility];  Service: Ophthalmology;  Laterality: Right;  CDE 6.12    Family History  Problem Relation Age of Onset  . Anesthesia problems Neg Hx   . Hypotension Neg Hx   . Pseudochol deficiency Neg Hx   . Malignant hyperthermia Neg Hx     History  Substance Use Topics  . Smoking status: Never Smoker   . Smokeless tobacco: Not on file  . Alcohol Use: No    OB History   Grav Para Term Preterm Abortions TAB SAB Ect Mult Living                  Review of Systems A complete 10 system review of systems was obtained and all systems are negative except as noted in the HPI and PMH.  Allergies  Vancomycin cross reactors  Home Medications   Current Outpatient Rx  Name  Route  Sig  Dispense  Refill  . albuterol (PROVENTIL HFA;VENTOLIN HFA) 108 (90 BASE) MCG/ACT inhaler   Inhalation   Inhale 2 puffs into the lungs every 4 (four) hours as needed. For shortness of breath         . ALPRAZolam (XANAX) 1 MG tablet   Oral   Take 1 mg by mouth 3 (three) times daily.         [name] Kitchen  amLODipine-benazepril (LOTREL) 10-20 MG per capsule   Oral   Take 1 capsule by mouth every morning.         . metoprolol (LOPRESSOR) 50 MG tablet   Oral   Take 50 mg by mouth 2 (two) times daily.           BP 145/84  Pulse 104  Temp(Src) 98.4 F (36.9 C) (Oral)  Resp 20  Ht 5' 4"  (1.626 m)  Wt 240 lb (108.863 kg)  BMI 41.18 kg/m2  SpO2 100%  Physical Exam  Nursing note and vitals reviewed. Constitutional: She is oriented to person, place, and time. She appears well-developed and well-nourished.  Obsese  HENT:  Head: Normocephalic and atraumatic.  Eyes: Conjunctivae and EOM are normal. Pupils are equal, round, and reactive to light.  Neck: Normal range of motion. Neck supple.  Cardiovascular: Normal rate and normal heart sounds.  An irregular rhythm present.   Pulmonary/Chest: Effort normal and breath sounds normal.  Abdominal: Soft. Bowel sounds are normal.  Musculoskeletal: Normal range of motion.  Neurological: She is alert and oriented to person, place, and time.  Skin: Skin is warm and dry.  Psychiatric: She has a normal mood and affect.    ED Course  Procedures (including critical care time)  DIAGNOSTIC STUDIES: Oxygen Saturation is 100% on room air, normal by my interpretation.    COORDINATION OF CARE:  16:40- Discussed planned course of treatment with the patient, including a chest X-ray, CBC, comprehensive metabolic panel, and Troponin, who is agreeable at this time.  17:00- Medication Orders- diltiazem (cardizem) 100 mg in dextrose 5% 100 mL infusion  Results for orders placed during the hospital encounter of 12/07/12  CBC WITH DIFFERENTIAL      Result Value Range   WBC 9.4  4.0 - 10.5 K/uL   RBC 4.71  3.87 - 5.11 MIL/uL   Hemoglobin 14.8  12.0 - 15.0 g/dL   HCT 43.9  36.0 - 46.0 %   MCV 93.2  78.0 - 100.0 fL   MCH 31.4  26.0 - 34.0 pg   MCHC 33.7  30.0 - 36.0 g/dL   RDW 12.6  11.5 - 15.5 %   Platelets 155  150 - 400 K/uL   Neutrophils Relative 61  43 - 77 %   Neutro Abs 5.8  1.7 - 7.7 K/uL   Lymphocytes Relative 30  12 - 46 %   Lymphs Abs 2.9  0.7 - 4.0 K/uL   Monocytes Relative 6  3 - 12 %   Monocytes Absolute 0.6  0.1 - 1.0 K/uL   Eosinophils Relative 2  0 - 5 %   Eosinophils Absolute 0.1  0.0 - 0.7 K/uL   Basophils Relative 1  0 - 1 %   Basophils Absolute 0.1  0.0 - 0.1 K/uL  COMPREHENSIVE METABOLIC PANEL      Result Value Range   Sodium 142  135 - 145 mEq/L   Potassium 3.6  3.5 - 5.1 mEq/L   Chloride 103  96 - 112 mEq/L   CO2 28  19 - 32 mEq/L   Glucose, Bld 105 (*) 70 - 99 mg/dL   BUN 14  6 - 23 mg/dL   Creatinine, Ser 0.89  0.50 - 1.10 mg/dL   Calcium 9.6  8.4 - 10.5 mg/dL   Total Protein 7.8  6.0 - 8.3 g/dL   Albumin 4.0  3.5 - 5.2 g/dL   AST 13  0 - 37 U/L   ALT <  5  0 - 35 U/L   Alkaline  Phosphatase 78  39 - 117 U/L   Total Bilirubin 0.5  0.3 - 1.2 mg/dL   GFR calc non Af Amer 70 (*) >90 mL/min   GFR calc Af Amer 81 (*) >90 mL/min  TROPONIN I      Result Value Range   Troponin I <0.30  <0.30 ng/mL     Labs Reviewed  COMPREHENSIVE METABOLIC PANEL - Abnormal; Notable for the following:    Glucose, Bld 105 (*)    GFR calc non Af Amer 70 (*)    GFR calc Af Amer 81 (*)    All other components within normal limits  CBC WITH DIFFERENTIAL  TROPONIN I   Dg Chest Port 1 View  12/07/2012  *RADIOLOGY REPORT*  Clinical Data: Short of breath.  Chest pain and weakness  PORTABLE CHEST - 1 VIEW  Comparison: 07/10/2012  Findings: Mild scarring right lung base is unchanged.  Negative for pneumonia.  Negative for heart failure or effusion.  IMPRESSION: No acute cardiopulmonary abnormality.   Original Report Authenticated By: Carl Best, M.D.    No diagnosis found.   Date: 12/07/2012  Rate: 101  Rhythm: sinus tachycardia  QRS Axis: normal  Intervals: normal  ST/T Wave abnormalities: normal  Conduction Disutrbances:none  Narrative Interpretation:   Old EKG Reviewed: none available c PAC's  CRITICAL CARE Performed by: Nat Christen   Total critical care time: 30  Critical care time was exclusive of separately billable procedures and treating other patients.  Critical care was necessary to treat or prevent imminent or life-threatening deterioration.  Critical care was time spent personally by me on the following activities: development of treatment plan with patient and/or surrogate as well as nursing, discussions with consultants, evaluation of patient's response to treatment, examination of patient, obtaining history from patient or surrogate, ordering and performing treatments and interventions, ordering and review of laboratory studies, ordering and review of radiographic studies, pulse oximetry and re-evaluation of patient's condition. MDM  Patient has worsening episodes of  palpitations and presyncopal feeling.  EKG shows ectopy suggesting atrial fibrillation. Cardizem drip started. Admit to general medicine I personally performed the services described in this documentation, which was scribed in my presence. The recorded information has been reviewed and is accurate.         Nat Christen, MD 12/07/12 1944

## 2012-12-07 NOTE — ED Notes (Signed)
Pt arrived from home by ems, has been weak and "passing out for the last 2 months", told her pmd (in [place])  And he "won't listen", today sob came on suddenly and she became weak. When ems arrived, pt was hypertensive and in afib w/ heartrate  140-220.  She is alert and able to answer all ?'s.

## 2012-12-07 NOTE — ED Notes (Signed)
Pt was given cardiazem by ems pta and converted to nsr.  She was in sr at arrival.

## 2012-12-08 ENCOUNTER — Encounter (HOSPITAL_COMMUNITY): Payer: Self-pay | Admitting: Internal Medicine

## 2012-12-08 ENCOUNTER — Inpatient Hospital Stay (HOSPITAL_COMMUNITY): Payer: Medicaid Other

## 2012-12-08 DIAGNOSIS — R55 Syncope and collapse: Secondary | ICD-10-CM | POA: Insufficient documentation

## 2012-12-08 DIAGNOSIS — J45909 Unspecified asthma, uncomplicated: Secondary | ICD-10-CM | POA: Diagnosis present

## 2012-12-08 DIAGNOSIS — I10 Essential (primary) hypertension: Secondary | ICD-10-CM

## 2012-12-08 DIAGNOSIS — I498 Other specified cardiac arrhythmias: Principal | ICD-10-CM

## 2012-12-08 DIAGNOSIS — I471 Supraventricular tachycardia: Secondary | ICD-10-CM | POA: Insufficient documentation

## 2012-12-08 LAB — BASIC METABOLIC PANEL
BUN: 14 mg/dL (ref 6–23)
CO2: 28 mEq/L (ref 19–32)
Chloride: 106 mEq/L (ref 96–112)
Creatinine, Ser: 0.85 mg/dL (ref 0.50–1.10)

## 2012-12-08 LAB — CBC
HCT: 40.5 % (ref 36.0–46.0)
MCH: 30.9 pg (ref 26.0–34.0)
MCHC: 32.8 g/dL (ref 30.0–36.0)
MCV: 94.2 fL (ref 78.0–100.0)
RDW: 12.8 % (ref 11.5–15.5)

## 2012-12-08 MED ORDER — ENOXAPARIN SODIUM 40 MG/0.4ML ~~LOC~~ SOLN
40.0000 mg | SUBCUTANEOUS | Status: DC
  Administered 2012-12-08 – 2012-12-09 (×2): 40 mg via SUBCUTANEOUS
  Filled 2012-12-08 (×2): qty 0.4

## 2012-12-08 MED ORDER — IOHEXOL 350 MG/ML SOLN
100.0000 mL | Freq: Once | INTRAVENOUS | Status: AC | PRN
  Administered 2012-12-08: 100 mL via INTRAVENOUS

## 2012-12-08 MED ORDER — ONDANSETRON HCL 4 MG/2ML IJ SOLN: 4.0000 mg | INTRAMUSCULAR | Status: DC | PRN

## 2012-12-08 MED ORDER — TRAZODONE HCL 50 MG PO TABS: 50.0000 mg | ORAL_TABLET | Freq: Every evening | ORAL | Status: DC | PRN

## 2012-12-08 MED ORDER — ALPRAZOLAM 1 MG PO TABS
1.0000 mg | ORAL_TABLET | Freq: Three times a day (TID) | ORAL | Status: DC | PRN
  Administered 2012-12-08 (×2): 1 mg via ORAL
  Filled 2012-12-08: qty 1
  Filled 2012-12-08: qty 2

## 2012-12-08 MED ORDER — SORBITOL 70 % SOLN: 30.0000 mL | Freq: Every day | Status: DC | PRN

## 2012-12-08 MED ORDER — POTASSIUM CHLORIDE IN NACL 20-0.9 MEQ/L-% IV SOLN
INTRAVENOUS | Status: DC
  Administered 2012-12-08: 01:00:00 via INTRAVENOUS

## 2012-12-08 MED ORDER — SODIUM CHLORIDE 0.9 % IJ SOLN
3.0000 mL | Freq: Two times a day (BID) | INTRAMUSCULAR | Status: DC
  Administered 2012-12-08 – 2012-12-09 (×3): 3 mL via INTRAVENOUS
  Filled 2012-12-08: qty 3

## 2012-12-08 MED ORDER — METOPROLOL TARTRATE 50 MG PO TABS
50.0000 mg | ORAL_TABLET | Freq: Two times a day (BID) | ORAL | Status: DC
  Administered 2012-12-08 – 2012-12-09 (×3): 50 mg via ORAL
  Filled 2012-12-08 (×4): qty 1

## 2012-12-08 MED ORDER — ACETAMINOPHEN 325 MG PO TABS: 650.0000 mg | ORAL_TABLET | ORAL | Status: DC | PRN

## 2012-12-08 MED ORDER — ASPIRIN EC 81 MG PO TBEC
81.0000 mg | DELAYED_RELEASE_TABLET | Freq: Every day | ORAL | Status: DC
  Administered 2012-12-08 – 2012-12-09 (×2): 81 mg via ORAL
  Filled 2012-12-08 (×2): qty 1

## 2012-12-08 NOTE — Progress Notes (Addendum)
Pt only wears nasal prongs at night with O2, hooked to concentrator no CPAP.

## 2012-12-08 NOTE — Progress Notes (Signed)
Subjective: Patient feels short of breath with exertion. No chest pains or palpitations.  Objective: Vital signs in last 24 hours: Filed Vitals:   12/08/12 0300 12/08/12 0400 12/08/12 0500 12/08/12 0800  BP:  96/49    Pulse: 66 65 70   Temp:  98.3 F (36.8 C)  97.9 F (36.6 C)  TempSrc:  Axillary  Axillary  Resp: 23 23 18    Height:      Weight:   101.9 kg (224 lb 10.4 oz)   SpO2: 92% 94% 94%    Weight change:   Intake/Output Summary (Last 24 hours) at 12/08/12 0930 Last data filed at 12/08/12 0300  Gross per 24 hour  Intake 140.75 ml  Output      0 ml  Net 140.75 ml   General: Anxious appearing. Obese. Breathing nonlabored. Lungs clear to auscultation bilaterally without wheeze rhonchi or rales Cardiovascular regular rate rhythm without murmurs gallops rubs Abdomen soft nontender nondistended Extremities no clubbing cyanosis or edema  Lab Results: Basic Metabolic Panel:  Recent Labs Lab 12/07/12 1647 12/08/12 0446  NA 142 143  K 3.6 3.5  CL 103 106  CO2 28 28  GLUCOSE 105* 109*  BUN 14 14  CREATININE 0.89 0.85  CALCIUM 9.6 9.1  MG  --  1.9   Liver Function Tests:  Recent Labs Lab 12/07/12 1647  AST 13  ALT <5  ALKPHOS 78  BILITOT 0.5  PROT 7.8  ALBUMIN 4.0   No results found for this basename: LIPASE, AMYLASE,  in the last 168 hours No results found for this basename: AMMONIA,  in the last 168 hours CBC:  Recent Labs Lab 12/07/12 1647 12/08/12 0446  WBC 9.4 7.3  NEUTROABS 5.8  --   HGB 14.8 13.3  HCT 43.9 40.5  MCV 93.2 94.2  PLT 155 154   Cardiac Enzymes:  Recent Labs Lab 12/07/12 1647 12/08/12 0713  TROPONINI <0.30 <0.30   BNP:  Recent Labs Lab 12/08/12 0446  PROBNP 536.3*   D-Dimer:  Recent Labs Lab 12/08/12 0713  DDIMER 3.49*   CBG: No results found for this basename: GLUCAP,  in the last 168 hours Hemoglobin A1C: No results found for this basename: HGBA1C,  in the last 168 hours Fasting Lipid Panel: No  results found for this basename: CHOL, HDL, LDLCALC, TRIG, CHOLHDL, LDLDIRECT,  in the last 168 hours Thyroid Function Tests: No results found for this basename: TSH, T4TOTAL, FREET4, T3FREE, THYROIDAB,  in the last 168 hours   Micro Results: Recent Results (from the past 240 hour(s))  MRSA PCR SCREENING     Status: None   Collection Time    12/08/12  4:30 AM      Result Value Range Status   MRSA by PCR NEGATIVE  NEGATIVE Final   Comment:            The GeneXpert MRSA Assay (FDA     approved for NASAL specimens     only), is one component of a     comprehensive MRSA colonization     surveillance program. It is not     intended to diagnose MRSA     infection nor to guide or     monitor treatment for     MRSA infections.   Studies/Results: Dg Chest Port 1 View  12/07/2012  *RADIOLOGY REPORT*  Clinical Data: Short of breath.  Chest pain and weakness  PORTABLE CHEST - 1 VIEW  Comparison: 07/10/2012  Findings: Mild scarring right lung  base is unchanged.  Negative for pneumonia.  Negative for heart failure or effusion.  IMPRESSION: No acute cardiopulmonary abnormality.   Original Report Authenticated By: Carl Best, M.D.    Scheduled Meds: . aspirin EC  81 mg Oral Daily  . enoxaparin (LOVENOX) injection  40 mg Subcutaneous Q24H  . metoprolol  50 mg Oral BID  . sodium chloride  3 mL Intravenous Q12H   Continuous Infusions: . 0.9 % NaCl with KCl 20 mEq / L 50 mL/hr at 12/08/12 0039   PRN Meds:.acetaminophen, ALPRAZolam, ondansetron (ZOFRAN) IV, sorbitol, traZODone Assessment/Plan:  SVT: I reviewed strips from the emergency room. Patient's heart rate was over 150 or so. Currently she is in sinus rhythm. D-dimer is high and she is still short of breath. Will order CT angiogram of the chest. Blood pressure has been borderline low on metoprolol alone. Patient reports that her medications have been increased recently, as she usually suffers from extremely accelerated hypertension. TSH  pending. Will check orthostatic vital signs.    Morbid obesity    Anxiety state, unspecified    Asthma, chronic    HTN (hypertension)   LOS: 1 day   Delmon Andrada L 12/08/2012, 9:30 AM

## 2012-12-08 NOTE — H&P (Signed)
[facility] History and Physical  Sabrina Curry  IWO:[id number]  DOB: [birth year]   DOA: 12/07/2012   PCP:   Neale Burly, MD   Chief Complaint:  Episodic pounding in the chest for 2 weeks  HPI: Sabrina Curry is an 62 y.o. female.   Morbidly obese Caucasian lady with a history of asthma anxiety and hypertension, has been having episodic pounding in her chest for the past 2 weeks; episodes occur about every other day, last about 20 minutes and are very frightening. She has visited her primary care physician and reportedly was reassured her that everything is okay, but became so terrified by a particularly violent episode today, when she felt as if she was on a pass out, that she called EMS and was brought to our emergency room. She reportedly was found to have a heart rate in the 140s, and her rhythm was grossly described as sinus tachycardia, and "like atrial fibrillation" she was started on a Cardizem drip and the hospitalist service called to assist with management. Soon after starting the Cardizem drip she converted to sinus rhythm.  Episodes are associated with nausea but no vomiting, and times a central chest pain. She has no history of heart problem, he does not drink coffee nor energy drinks no caffeinated drinks. She does drink diet Sprite she does not smoke. She has not used her asthma inhaler for some months.  She has had no lower extremity edema.  She uses nasal CPAP for sleep apnea at bedtime  Rewiew of Systems:   All systems negative except as marked bold or noted in the HPI;  Constitutional:    malaise, fever and chills. ;  Eyes:   eye pain, redness and discharge. ;  ENMT:   ear pain, hoarseness, nasal congestion, sinus pressure and sore throat. ;  Cardiovascular:    chest pain, palpitations, diaphoresis, dyspnea and peripheral edema.  Respiratory:   cough, hemoptysis, wheezing and stridor. ;  Gastrointestinal:  nausea, vomiting, diarrhea, constipation, abdominal  pain, melena, blood in stool, hematemesis, jaundice and rectal bleeding. unusual weight loss..   Genitourinary:    frequency, dysuria, incontinence,flank pain and hematuria; Musculoskeletal:   back pain and neck pain.  swelling and trauma.;  Skin: .  pruritus, rash, abrasions, bruising and skin lesion.; ulcerations Neuro:    headache, lightheadedness and neck stiffness.  weakness, altered level of consciousness, altered mental status, extremity weakness, burning feet, involuntary movement, seizure and syncope.  Psych:    anxiety, depression, insomnia, tearfulness, panic attacks, hallucinations, paranoia, suicidal or homicidal ideation    Past Medical History  Diagnosis Date  . Asthma   . Anxiety   . GERD (gastroesophageal reflux disease)   . Headache   . Arthritis   . Chronic back pain   . Hypertension     Past Surgical History  Procedure Laterality Date  . Abdominal hysterectomy    . Cholecystectomy      [facility]  . Back surgery      had staff infection of back  . Umbilical hernia repair    . Hernia repair  2009    inscional hernia repair x2-3  . Appendectomy      with gallbladder removal  . Cataract extraction w/phaco  01/16/2012    Procedure: CATARACT EXTRACTION PHACO AND INTRAOCULAR LENS PLACEMENT (IOC);  Surgeon: Elta Guadeloupe T. Gershon Crane, MD;  Location: [facility];  Service: Ophthalmology;  Laterality: Right;  CDE 6.12    Medications:  HOME MEDS: Prior to Admission medications  Medication Sig Start Date End Date Taking? Authorizing Provider  albuterol (PROVENTIL HFA;VENTOLIN HFA) 108 (90 BASE) MCG/ACT inhaler Inhale 2 puffs into the lungs every 4 (four) hours as needed. For shortness of breath   Yes Historical Provider, MD  ALPRAZolam Duanne Moron) 1 MG tablet Take 1 mg by mouth 3 (three) times daily.   Yes Historical Provider, MD  amLODipine-benazepril (LOTREL) 10-20 MG per capsule Take 1 capsule by mouth every morning.   Yes Historical Provider, MD  metoprolol (LOPRESSOR) 50 MG  tablet Take 50 mg by mouth 2 (two) times daily.   Yes Historical Provider, MD     Allergies:  Allergies  Allergen Reactions  . Vancomycin Cross Reactors Anaphylaxis    Social History:   reports that she has never smoked. She does not have any smokeless tobacco history on file. She reports that she does not drink alcohol or use illicit drugs.  Family History: Family History  Problem Relation Age of Onset  . Anesthesia problems Neg Hx   . Hypotension Neg Hx   . Pseudochol deficiency Neg Hx   . Malignant hyperthermia Neg Hx   . Hypertension Mother   . Diabetes Mother      Physical Exam: Filed Vitals:   12/07/12 2200 12/07/12 2300 12/08/12 0000 12/08/12 0002  BP: 122/77 111/88 100/63   Pulse: 78 81 71   Temp:   97.7 F (36.5 C)   TempSrc:   Oral   Resp: 23 19 24    Height:      Weight:      SpO2: 97% 89% 93% 95%   Blood pressure 100/63, pulse 71, temperature 97.7 F (36.5 C), temperature source Oral, resp. rate 24, height 5' 4"  (1.626 m), weight 101.1 kg (222 lb 14.2 oz), SpO2 95.00%.  GEN:  Pleasant pleasant but anxious middle-aged Caucasian lady lying bed; cooperative with exam PSYCH:  alert and oriented x4;  affect is appropriate. HEENT: Mucous membranes pink and anicteric; PERRLA; EOM intact; no cervical lymphadenopathy nor thyromegaly or carotid bruit; no JVD; Breasts:: Not examined CHEST WALL: No tenderness CHEST: Normal respiration, clear to auscultation bilaterally HEART: Regular rate and rhythm; no murmurs rubs or gallops BACK:; no CVA tenderness ABDOMEN: Obese, soft non-tender; arch ventral hernia, no organomegaly, normal abdominal bowel sounds; large pannus pannus;  intertriginous erythema and rash. Rectal Exam: Not done EXTREMITIES:  age-appropriate arthropathy of the hands and knees; no edema; no ulcerations. Varicose veins Genitalia: not examined PULSES: 3+ and symmetric SKIN: Normal hydration no rash or ulceration CNS: Cranial nerves 2-12 grossly  intact no focal lateralizing neurologic deficit   Labs on Admission:  Basic Metabolic Panel:  Recent Labs Lab 12/07/12 1647  NA 142  K 3.6  CL 103  CO2 28  GLUCOSE 105*  BUN 14  CREATININE 0.89  CALCIUM 9.6   Liver Function Tests:  Recent Labs Lab 12/07/12 1647  AST 13  ALT <5  ALKPHOS 78  BILITOT 0.5  PROT 7.8  ALBUMIN 4.0   No results found for this basename: LIPASE, AMYLASE,  in the last 168 hours No results found for this basename: AMMONIA,  in the last 168 hours CBC:  Recent Labs Lab 12/07/12 1647  WBC 9.4  NEUTROABS 5.8  HGB 14.8  HCT 43.9  MCV 93.2  PLT 155   Cardiac Enzymes:  Recent Labs Lab 12/07/12 1647  TROPONINI <0.30   BNP: No components found with this basename: POCBNP,  D-dimer: No components found with this basename: D-DIMER,  CBG: No results  found for this basename: GLUCAP,  in the last 168 hours  Radiological Exams on Admission: Dg Chest Port 1 View  12/07/2012  *RADIOLOGY REPORT*  Clinical Data: Short of breath.  Chest pain and weakness  PORTABLE CHEST - 1 VIEW  Comparison: 07/10/2012  Findings: Mild scarring right lung base is unchanged.  Negative for pneumonia.  Negative for heart failure or effusion.  IMPRESSION: No acute cardiopulmonary abnormality.   Original Report Authenticated By: Carl Best, M.D.     EKG: Independently reviewed. Sinus rhythm; telemetry strip shows a short burst of irregular narrow complex tachycardia, possible SVT possible a.fib.   Assessment/Plan Present on Admission:   supraventricular tachycardia . Morbid obesity . Anxiety state, unspecified . Asthma, chronic . HTN (hypertension)   PLAN: I haven't seen clear rhythm, but her description sounds like paroxysmal SVT, keep her in the step down unit off Cardizem and monitor her rhythm overnight; consider discharging her on oral Cardizem for cardiology followup. remains stable.  Shortness of breath and lightheadedness are not dominant features of  her symptoms, but given her obesity and varicose veins,will check a d-dimer in the remote possibility that this is pulmonary embolus;  if positive will consider lower extremity Dopplers.  Can continue management of her chronic medical conditions, but will reevaluate for dosing of her amlodipine and ACE inhibitor, versus increasing her beta blocker in the morning   Other plans as per orders.  Code Status: FULL CODE  Family Communication: With patient and husband at bedside Disposition Plan: Depending on hospital course    Lane Kjos Nocturnist [facility] Pager [phone]   12/08/2012, 12:24 AM

## 2012-12-09 ENCOUNTER — Inpatient Hospital Stay (HOSPITAL_COMMUNITY): Payer: Medicaid Other

## 2012-12-09 ENCOUNTER — Encounter (HOSPITAL_COMMUNITY): Payer: Self-pay

## 2012-12-09 DIAGNOSIS — I519 Heart disease, unspecified: Secondary | ICD-10-CM

## 2012-12-09 DIAGNOSIS — I517 Cardiomegaly: Secondary | ICD-10-CM

## 2012-12-09 MED ORDER — TECHNETIUM TO 99M ALBUMIN AGGREGATED
6.0000 | Freq: Once | INTRAVENOUS | Status: AC | PRN
  Administered 2012-12-09: 5 via INTRAVENOUS

## 2012-12-09 MED ORDER — POTASSIUM CHLORIDE ER 10 MEQ PO TBCR: 10.0000 meq | EXTENDED_RELEASE_TABLET | Freq: Two times a day (BID) | ORAL | Status: DC

## 2012-12-09 MED ORDER — TECHNETIUM TC 99M DIETHYLENETRIAME-PENTAACETIC ACID
35.0000 | Freq: Once | INTRAVENOUS | Status: AC | PRN
  Administered 2012-12-09: 34 via INTRAVENOUS

## 2012-12-09 MED ORDER — METOPROLOL TARTRATE 100 MG PO TABS: 100.0000 mg | ORAL_TABLET | Freq: Two times a day (BID) | ORAL | Status: DC

## 2012-12-09 MED ORDER — FUROSEMIDE 20 MG PO TABS: 20.0000 mg | ORAL_TABLET | Freq: Every day | ORAL | Status: DC

## 2012-12-09 NOTE — Progress Notes (Signed)
Pt ambulated on unit without oxygen.  O2 sats at lowest 93%.  Pt resting in bed now.  O2 sats 96% on room air.

## 2012-12-09 NOTE — Progress Notes (Signed)
*  PRELIMINARY RESULTS* Echocardiogram Routine 2D echo no bubble study per Dr. Conley Canal has been performed.  Tera Partridge 12/09/2012, 10:16 AM

## 2012-12-09 NOTE — Care Management Note (Unsigned)
    Page 1 of 1   12/09/2012     2:00:45 PM   CARE MANAGEMENT NOTE 12/09/2012  Patient:  Sabrina Curry, Sabrina Curry   Account Number:  [id number]  Date Initiated:  12/09/2012  Documentation initiated by:  Claretha Cooper  Subjective/Objective Assessment:   Pt admitted from home with her spouse. Home O2 from [facility]. [facility] visited with pt and she declined their Community care service.     Action/Plan:   Anticipated DC Date:  12/11/2012   Anticipated DC Plan:  [place]  CM consult      Choice offered to / List presented to:             Status of service:  In process, will continue to follow Medicare Important Message given?   (If response is "NO", the following Medicare IM given date fields will be blank) Date Medicare IM given:   Date Additional Medicare IM given:    Discharge Disposition:    Per UR Regulation:    If discussed at Long Length of Stay Meetings, dates discussed:    Comments:  12/09/12 Claretha Cooper RN BSN CM

## 2012-12-09 NOTE — Progress Notes (Signed)
UR Chart Review Completed  

## 2012-12-09 NOTE — Progress Notes (Signed)
Sabrina Curry Kitchen INITIAL NUTRITION ASSESSMENT  DOCUMENTATION CODES Per approved criteria  -Obesity Class II   INTERVENTION:  Heart healthy diet edu/ handouts and RD contact information provided  RD to follow for nutrition needs  NUTRITION DIAGNOSIS: Knowledge deficit related to appropriate food choices to maintain a healthy weight as evidenced by BMI=38.   Goal: Pt to meet >/= 90% of their estimated nutrition needs  Monitor:  Po intake, labs and wt trends  Reason for Assessment: Malnurtriton Screen  62 y.o. female  Admitting Dx: SVT (supraventricular tachycardia)  ASSESSMENT: Pt wt hx reflects wt loss of 5# over past 11 months which is desired given her obese state. Will provide Heart Healthy diet education. Recommend her to increase physical activity (with MD approval) in order to promote wt loss (10%)  of her current wt. Pt does not meet criteria for malnutrition.  Height: Ht Readings from Last 1 Encounters:  12/07/12 5' 4"  (1.626 m)    Weight: Wt Readings from Last 1 Encounters:  12/08/12 224 lb 10.4 oz (101.9 kg)    Ideal Body Weight: 120# (54.5 kg)  % Ideal Body Weight: 187%  Wt Readings from Last 10 Encounters:  12/08/12 224 lb 10.4 oz (101.9 kg)  01/12/12 230 lb (104.327 kg)    Usual Body Weight: 230# (104.3 kg)  % Usual Body Weight: 98%  BMI:  Body mass index is 38.54 kg/(m^2).Obesity Class II  Estimated Nutritional Needs: Kcal: 1200-1400 (to promote gradual wt loss) Protein: 70-80 gr Fluid: >1500 ml/day  Skin: No issues noted  Diet Order: Cardiac  EDUCATION NEEDS: -Education needs addressed   Intake/Output Summary (Last 24 hours) at 12/09/12 0951 Last data filed at 12/09/12 0532  Gross per 24 hour  Intake    683 ml  Output      0 ml  Net    683 ml    Last BM: 12/07/12   Labs:   Recent Labs Lab 12/07/12 1647 12/08/12 0446  NA 142 143  K 3.6 3.5  CL 103 106  CO2 28 28  BUN 14 14  CREATININE 0.89 0.85  CALCIUM 9.6 9.1  MG  --  1.9   GLUCOSE 105* 109*    CBG (last 3)  No results found for this basename: GLUCAP,  in the last 72 hours  Scheduled Meds: . aspirin EC  81 mg Oral Daily  . enoxaparin (LOVENOX) injection  40 mg Subcutaneous Q24H  . metoprolol  50 mg Oral BID  . sodium chloride  3 mL Intravenous Q12H    Continuous Infusions:   Past Medical History  Diagnosis Date  . Asthma   . Anxiety   . GERD (gastroesophageal reflux disease)   . Headache   . Arthritis   . Chronic back pain   . Hypertension   . Obstructive sleep apnea     On CPAP by nasal prong    Past Surgical History  Procedure Laterality Date  . Abdominal hysterectomy    . Cholecystectomy      [facility]  . Back surgery      had staff infection of back  . Umbilical hernia repair    . Hernia repair  2009    inscional hernia repair x2-3  . Appendectomy      with gallbladder removal  . Cataract extraction w/phaco  01/16/2012    Procedure: CATARACT EXTRACTION PHACO AND INTRAOCULAR LENS PLACEMENT (IOC);  Surgeon: Elta Guadeloupe T. Gershon Crane, MD;  Location: [facility];  Service: Ophthalmology;  Laterality: Right;  CDE 6.12  Colman Cater MS,RD,LDN,CSG Office: [phone] Pager: [phone]

## 2012-12-09 NOTE — Discharge Summary (Signed)
Physician Discharge Summary  Patient ID: Sabrina Curry MRN: [id number] DOB/AGE: Feb 07, 1951 62 y.o.  Admit date: 12/07/2012 Discharge date: 12/09/2012  Discharge Diagnoses:  Principal Problem:   SVT (supraventricular tachycardia) Active Problems:   Asthma, chronic   HTN (hypertension)   Diastolic dysfunction   Morbid obesity   Anxiety state, unspecified    Medication List    STOP taking these medications       amLODipine-benazepril 10-20 MG per capsule  Commonly known as:  LOTREL      TAKE these medications       albuterol 108 (90 BASE) MCG/ACT inhaler  Commonly known as:  PROVENTIL HFA;VENTOLIN HFA  Inhale 2 puffs into the lungs every 4 (four) hours as needed. For shortness of breath     ALPRAZolam 1 MG tablet  Commonly known as:  XANAX  Take 1 mg by mouth 3 (three) times daily.     furosemide 20 MG tablet  Commonly known as:  LASIX  Take 1 tablet (20 mg total) by mouth daily.     metoprolol 100 MG tablet  Commonly known as:  LOPRESSOR  Take 1 tablet (100 mg total) by mouth 2 (two) times daily.     potassium chloride 10 MEQ tablet  Commonly known as:  K-DUR  Take 1 tablet (10 mEq total) by mouth 2 (two) times daily.           Discharge Orders   Future Orders Complete By Expires     Activity as tolerated - No restrictions  As directed     Diet - low sodium heart healthy  As directed       Disposition: 01-Home or Self Care  Discharged Condition:   Consults:    Labs:   Results for orders placed during the hospital encounter of 12/07/12 (from the past 48 hour(s))  CBC WITH DIFFERENTIAL     Status: None   Collection Time    12/07/12  4:47 PM      Result Value Range   WBC 9.4  4.0 - 10.5 K/uL   RBC 4.71  3.87 - 5.11 MIL/uL   Hemoglobin 14.8  12.0 - 15.0 g/dL   HCT 43.9  36.0 - 46.0 %   MCV 93.2  78.0 - 100.0 fL   MCH 31.4  26.0 - 34.0 pg   MCHC 33.7  30.0 - 36.0 g/dL   RDW 12.6  11.5 - 15.5 %   Platelets 155  150 - 400 K/uL   Neutrophils  Relative 61  43 - 77 %   Neutro Abs 5.8  1.7 - 7.7 K/uL   Lymphocytes Relative 30  12 - 46 %   Lymphs Abs 2.9  0.7 - 4.0 K/uL   Monocytes Relative 6  3 - 12 %   Monocytes Absolute 0.6  0.1 - 1.0 K/uL   Eosinophils Relative 2  0 - 5 %   Eosinophils Absolute 0.1  0.0 - 0.7 K/uL   Basophils Relative 1  0 - 1 %   Basophils Absolute 0.1  0.0 - 0.1 K/uL  COMPREHENSIVE METABOLIC PANEL     Status: Abnormal   Collection Time    12/07/12  4:47 PM      Result Value Range   Sodium 142  135 - 145 mEq/L   Potassium 3.6  3.5 - 5.1 mEq/L   Chloride 103  96 - 112 mEq/L   CO2 28  19 - 32 mEq/L   Glucose, Bld 105 (*) 70 -  99 mg/dL   BUN 14  6 - 23 mg/dL   Creatinine, Ser 0.89  0.50 - 1.10 mg/dL   Calcium 9.6  8.4 - 10.5 mg/dL   Total Protein 7.8  6.0 - 8.3 g/dL   Albumin 4.0  3.5 - 5.2 g/dL   AST 13  0 - 37 U/L   ALT <5  0 - 35 U/L   Alkaline Phosphatase 78  39 - 117 U/L   Total Bilirubin 0.5  0.3 - 1.2 mg/dL   GFR calc non Af Amer 70 (*) >90 mL/min   GFR calc Af Amer 81 (*) >90 mL/min   Comment:            The eGFR has been calculated     using the CKD EPI equation.     This calculation has not been     validated in all clinical     situations.     eGFR's persistently     <90 mL/min signify     possible Chronic Kidney Disease.  TROPONIN I     Status: None   Collection Time    12/07/12  4:47 PM      Result Value Range   Troponin I <0.30  <0.30 ng/mL   Comment:            Due to the release kinetics of cTnI,     a negative result within the first hours     of the onset of symptoms does not rule out     myocardial infarction with certainty.     If myocardial infarction is still suspected,     repeat the test at appropriate intervals.  TSH     Status: None   Collection Time    12/07/12  4:47 PM      Result Value Range   TSH 2.378  0.350 - 4.500 uIU/mL  MRSA PCR SCREENING     Status: None   Collection Time    12/08/12  4:30 AM      Result Value Range   MRSA by PCR NEGATIVE   NEGATIVE   Comment:            The GeneXpert MRSA Assay (FDA     approved for NASAL specimens     only), is one component of a     comprehensive MRSA colonization     surveillance program. It is not     intended to diagnose MRSA     infection nor to guide or     monitor treatment for     MRSA infections.  BASIC METABOLIC PANEL     Status: Abnormal   Collection Time    12/08/12  4:46 AM      Result Value Range   Sodium 143  135 - 145 mEq/L   Potassium 3.5  3.5 - 5.1 mEq/L   Chloride 106  96 - 112 mEq/L   CO2 28  19 - 32 mEq/L   Glucose, Bld 109 (*) 70 - 99 mg/dL   BUN 14  6 - 23 mg/dL   Creatinine, Ser 0.85  0.50 - 1.10 mg/dL   Calcium 9.1  8.4 - 10.5 mg/dL   GFR calc non Af Amer 74 (*) >90 mL/min   GFR calc Af Amer 85 (*) >90 mL/min   Comment:            The eGFR has been calculated     using the CKD EPI equation.  This calculation has not been     validated in all clinical     situations.     eGFR's persistently     <90 mL/min signify     possible Chronic Kidney Disease.  CBC     Status: None   Collection Time    12/08/12  4:46 AM      Result Value Range   WBC 7.3  4.0 - 10.5 K/uL   RBC 4.30  3.87 - 5.11 MIL/uL   Hemoglobin 13.3  12.0 - 15.0 g/dL   HCT 40.5  36.0 - 46.0 %   MCV 94.2  78.0 - 100.0 fL   MCH 30.9  26.0 - 34.0 pg   MCHC 32.8  30.0 - 36.0 g/dL   RDW 12.8  11.5 - 15.5 %   Platelets 154  150 - 400 K/uL  PRO B NATRIURETIC PEPTIDE     Status: Abnormal   Collection Time    12/08/12  4:46 AM      Result Value Range   Pro B Natriuretic peptide (BNP) 536.3 (*) 0 - 125 pg/mL  MAGNESIUM     Status: None   Collection Time    12/08/12  4:46 AM      Result Value Range   Magnesium 1.9  1.5 - 2.5 mg/dL  D-DIMER, QUANTITATIVE     Status: Abnormal   Collection Time    12/08/12  7:13 AM      Result Value Range   D-Dimer, Quant 3.49 (*) 0.00 - 0.48 ug/mL-FEU   Comment:            AT THE INHOUSE ESTABLISHED CUTOFF     VALUE OF 0.48 ug/mL FEU,     THIS  ASSAY HAS BEEN DOCUMENTED     IN THE LITERATURE TO HAVE     A SENSITIVITY AND NEGATIVE     PREDICTIVE VALUE OF AT LEAST     98 TO 99%.  THE TEST RESULT     SHOULD BE CORRELATED WITH     AN ASSESSMENT OF THE CLINICAL     PROBABILITY OF DVT / VTE.  TROPONIN I     Status: None   Collection Time    12/08/12  7:13 AM      Result Value Range   Troponin I <0.30  <0.30 ng/mL   Comment:            Due to the release kinetics of cTnI,     a negative result within the first hours     of the onset of symptoms does not rule out     myocardial infarction with certainty.     If myocardial infarction is still suspected,     repeat the test at appropriate intervals.    Diagnostics:  Dg Chest 2 View  12/09/2012  *RADIOLOGY REPORT*  Clinical Data: Shortness of breath  CHEST - 2 VIEW  Comparison: 12/07/2012  Findings: Heart and pulmonary vascularity are within normal limits. The lungs are clear bilaterally.  No acute bony abnormality is seen.  IMPRESSION: No acute abnormality noted.   Original Report Authenticated By: Inez Catalina, M.D.    Ct Angio Chest Pe W/cm &/or Wo Cm  12/08/2012  *RADIOLOGY REPORT*  Clinical Data: SVT, elevated D-dimer.  CT ANGIOGRAPHY CHEST  Technique:  Multidetector CT imaging of the chest using the standard protocol during bolus administration of intravenous contrast. Multiplanar reconstructed images including MIPs were obtained and reviewed to evaluate the vascular anatomy.  Contrast:  124m OMNIPAQUE IOHEXOL 350 MG/ML SOLN scanner malfunction resulted in delayed acquisition related to bolus timing, which could not be repeated in a timely fashion based on service recommendations  Comparison: None.  Findings: The delayed scan timing results in inadequate pulmonary arterial opacification in the lower lobes.  There is good contrast opacification of the thoracic aorta. The ascending aorta is dilated to a maximal transverse diameter of 4.3 cm.  Aortic arch and descending thoracic aorta  unremarkable.  There is classic three- vessel brachiocephalic arterial origin anatomy without proximal stenosis.  No pleural or pericardial effusion.  No hilar or mediastinal adenopathy.  Linear scarring or subsegmental atelectasis in the right middle lobe and superior segment right lower lobe.  Left lung is clear.  Visualized portions of upper abdomen unremarkable.  Mild spondylitic changes in the visualized lower thoracic spine.  IMPRESSION:  1.  Poor bolus timing limits assessment for acute pulmonary emboli. 2.  4.3 cm ascending aortic aneurysm without complicating features.   Original Report Authenticated By: D. HWallace Going MD    Nm Pulmonary Perf And Vent  12/09/2012  *RADIOLOGY REPORT*  Clinical Data:  Dyspnea, elevated D-dimer, supraventricular tachycardia, history asthma and hypertension  NUCLEAR MEDICINE VENTILATION - PERFUSION LUNG SCAN  Technique:  Ventilation images were obtained in multiple projections using inhaled aerosol technetium 99 M DTPA.  Perfusion images were obtained in multiple projections after intravenous injection of Tc-989mAA.  Radiopharmaceuticals:  3480mTc-8m39mA aerosol and 5 mCi Tc-8m 62m  Comparison: None Correlation:  Findings:  Ventilation:   No focal ventilation defect. Central airway deposition of aerosol identified.  Swallowed aerosol within stomach.  Perfusion:  Normal perfusion pattern in both lungs.  Tortuous thoracic aorta noted.  No segmental or subsegmental perfusion defects identified.  Chest radiograph:  Minimal right basilar atelectasis.  IMPRESSION: Normal ventilation and perfusion lung scan.   Original Report Authenticated By: Mark Lavonia Dana.    Us VeKoreaus Img Lower Bilateral  12/09/2012  *RADIOLOGY REPORT*  Clinical Data: The bilateral lower extremity edema  VENOUS DUPLEX ULTRASOUND OF BILATERAL LOWER EXTREMITIES  Technique: Gray-scale sonography with graded compression, as well as color Doppler and duplex ultrasound, were performed to evaluate the deep  venous system of both lower extremities from the level of the common femoral vein through the popliteal and proximal calf veins. Spectral Doppler was utilized to evaulate flow at rest and with distal augmentation maneuvers.  Comparison: No comparison studies available.  Findings: The right common femoral vein, superficial femoral vein, deep femoral vein, saphenofemoral junction, and popliteal vein show normal patent directional flow, normal augmentation, and normal compression. Posterior tibial peroneal veins appear patent where visualized below the knee.  On the left, there is normal flow signal, normal compressibility, and normal augmentation in the common femoral vein, superficial femoral vein, profunda vein, saphenofemoral junction, and popliteal vein.  The posterior tibial and peroneal veins appear patent where visualized below the knee.  IMPRESSION: No evidence for DVT in either lower extremity.   Original Report Authenticated By: Eric Misty Stanley.    Dg Chest Port 1 View  12/07/2012  *RADIOLOGY REPORT*  Clinical Data: Short of breath.  Chest pain and weakness  PORTABLE CHEST - 1 VIEW  Comparison: 07/10/2012  Findings: Mild scarring right lung base is unchanged.  Negative for pneumonia.  Negative for heart failure or effusion.  IMPRESSION: No acute cardiopulmonary abnormality.   Original Report Authenticated By: DavidCarl Best.    EKG: sinus tach with PAC  Echo Left ventricle:  The cavity size was normal. Wall thickness was increased in a pattern of mild LVH. Systolic function was normal. The estimated ejection fraction was in the range of 60% to 65%. Wall motion was normal; there were no regional wall motion abnormalities. Features are consistent with a pseudonormal left ventricular filling pattern, with concomitant abnormal relaxation and increased filling pressure (grade 2 diastolic dysfunction). No previous study for comparison. - Aortic valve: Mildly calcified annulus. Trileaflet. -  Aorta: Mid-ascending aortic diameter: 41.58m (ED). - Ascending aorta: The ascending aorta was mildly dilated. - Mitral valve: Trivial regurgitation. - Left atrium: The atrium was at the upper limits of normal in size. - Right atrium: Central venous pressure: 160mHg (est). - Tricuspid valve: Trivial regurgitation. - Pulmonary arteries: Systolic pressure could not be accurately estimated. - Pericardium, extracardiac: There was no pericardial effusion.  Full Code   Hospital Course: See H&P for complete admission details. The patient is a [age]ear old white female who presented with palpitations. She experiences them frequently. This episode was particularly severe. She felt dyspneic and faint. She never lost consciousness. She called EMS. There is some confusion as to whether she was in atrial fibrillation. A brief strip was available that appeared to be SVT. She was given an intravenous Cardizem bolus, and converted to sinus rhythm at some point in the emergency room. Patient continued to complain of shortness of breath. D-dimer was checked and was elevated. A CT angiogram of the chest was done, but showed poor bolus timing, so could not fully evaluate for PE. It did however show a 4.3 cm descending aortic aneurysm. VQ scan was a normal exam. Doppler of the legs was negative. She did have a slightly elevated pro BNP at just over 500. Echocardiogram showed grade 2 diastolic dysfunction, so it is possible her dyspnea is related to diastolic heart failure. She will be started on Lasix and potassium at home. Her metoprolol dose will be doubled for better blood pressure control and hopefully keep her dysrhythmia from returning. She is to stop her Lotrel, but may need to resume this should her blood pressure remained uncontrolled at home. I spoke with KaJory Simsnurse practitioner with [facility]. Patient will be seen in the office as an outpatient and arrange an event monitor. They may also  need to adjust her diuretics. Patient will followup with [facility] for her ascending aortic aneurysm. She is interested in possibly finding a new primary care provider as well. Total time on the day of discharge greater than 30 minutes.  Discharge Exam:  Blood pressure 152/87, pulse 68, temperature 97.8 F (36.6 C), temperature source Oral, resp. rate 20, height 5' 4"  (1.626 m), weight 101.9 kg (224 lb 10.4 oz), SpO2 100.00%.  Lungs CTA without WRR CV RRR without MGR Abd S, NT, ND Ext No CCE  Signed: Gaylen Venning L 12/09/2012, 3:08 PM

## 2012-12-10 NOTE — Progress Notes (Signed)
AVS reviewed with patient.  Prescriptions provided to patient.  Appt with [facility] scheduled for tomorrow for event monitor.  [facility] contacted and referral information faxed to their office.  Pt instructed to contact them by tomorrow afternoon, if she has not heard from them. Pt asked about refill for Albuterol inhaler.  Dr. Conley Canal returned page and stated that she did not want to give a refill for this medication at this time. Pt should use this only in severe instances of wheezing d/t heart rate and to follow-up with her PCP.  Pt verbalized understanding of AVS instructions.  Teach back method used.  Pt's O2 sats 98% on RA.  Pt's two IVs removed. Sites WNL.  Pt transported by NT via w/c to main entrance for discharge.  Pt stable at time of discharge.

## 2012-12-11 ENCOUNTER — Telehealth: Payer: Self-pay | Admitting: Vascular Surgery

## 2012-12-11 NOTE — Telephone Encounter (Signed)
Notified patient that she needs to make an appointment withith [facility] that they are the doctors that repair ascending aortic aneurysms.

## 2012-12-12 ENCOUNTER — Other Ambulatory Visit: Payer: Self-pay | Admitting: *Deleted

## 2012-12-12 DIAGNOSIS — R002 Palpitations: Secondary | ICD-10-CM

## 2012-12-17 ENCOUNTER — Encounter: Payer: Medicaid Other | Admitting: Cardiothoracic Surgery

## 2012-12-17 DIAGNOSIS — I712 Thoracic aortic aneurysm, without rupture, unspecified: Secondary | ICD-10-CM | POA: Insufficient documentation

## 2012-12-17 DIAGNOSIS — R002 Palpitations: Secondary | ICD-10-CM

## 2012-12-17 HISTORY — DX: Thoracic aortic aneurysm, without rupture: I71.2

## 2012-12-17 HISTORY — DX: Thoracic aortic aneurysm, without rupture, unspecified: I71.20

## 2012-12-17 HISTORY — DX: Palpitations: R00.2

## 2012-12-19 ENCOUNTER — Encounter: Payer: Medicaid Other | Admitting: Cardiothoracic Surgery

## 2012-12-23 ENCOUNTER — Encounter: Payer: Medicaid Other | Admitting: Cardiothoracic Surgery

## 2012-12-24 ENCOUNTER — Encounter: Payer: Self-pay | Admitting: Cardiothoracic Surgery

## 2012-12-24 ENCOUNTER — Institutional Professional Consult (permissible substitution) (INDEPENDENT_AMBULATORY_CARE_PROVIDER_SITE_OTHER): Payer: Medicaid Other | Admitting: Cardiothoracic Surgery

## 2012-12-24 VITALS — BP 160/107 | HR 65 | Resp 20 | Ht 59.0 in | Wt 224.0 lb

## 2012-12-24 DIAGNOSIS — I712 Thoracic aortic aneurysm, without rupture: Secondary | ICD-10-CM

## 2012-12-24 NOTE — Progress Notes (Signed)
PCP is HASANAJ,XAJE A, MD Referring Provider is Neale Burly, MD  Chief Complaint  Patient presents with  . 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 She was admitted to [facility] with palpitations and shortness of breath. Her evaluation indicated that she had SVT with elevated d-dimer. Cardiac enzymes were negative. CT to rule out pulmonary embolus was negative however it showed a fusiform ascending aneurysm measuring 4.3 cm at maximal diameter. There is no hematoma or penetrating ulcer. For that reason a thoracic surgical evaluation was requested.  The patient also had a echocardiogram which showed normal LV EF 60%, no aortic insufficiency or stenosis with a tricuspid aortic valve, and LVH consistent with her history of hypertension. There is no pericardial effusion. Ultrasound of her legs is negative for DVT. Is felt the patient was having some SVT causing the palpitations and shortness of breath due to diastolic heart failure. She was discharged home on  Metoprolol 100 mg twice a day Lasix and potassium as well as Xanax and albuterol inhaler.  She has no chest pain. She states her family history is positive for a abdominal aneurysm in her father who died in his 37s  The patient is currently wearing an event monitor and will be followed up in: [facility] Cardiology. Past Medical History  Diagnosis Date  . Asthma   . Anxiety   . GERD (gastroesophageal reflux disease)   . Headache   . Arthritis   . Chronic back pain   . Hypertension   . Obstructive sleep apnea     On CPAP by nasal prong    Past Surgical History  Procedure Laterality Date  . Abdominal hysterectomy    . Cholecystectomy      [facility]  . Back surgery      had staff infection of back  .  Umbilical hernia repair    . Hernia repair  2009    inscional hernia repair x2-3  . Appendectomy      with gallbladder removal  . Cataract extraction w/phaco  01/16/2012    Procedure: CATARACT EXTRACTION PHACO AND INTRAOCULAR LENS PLACEMENT (IOC);  Surgeon: Elta Guadeloupe T. Gershon Crane, MD;  Location: [facility];  Service: Ophthalmology;  Laterality: Right;  CDE 6.12    Family History  Problem Relation Age of Onset  . Anesthesia problems Neg Hx   . Hypotension Neg Hx   . Pseudochol deficiency Neg Hx   . Malignant hyperthermia Neg Hx   . Hypertension Mother   . Diabetes Mother     Social History History  Substance Use Topics  . Smoking status: Never Smoker   . Smokeless tobacco: Not on file  . Alcohol Use: No    Current Outpatient Prescriptions  Medication Sig Dispense Refill  . albuterol (PROVENTIL HFA;VENTOLIN HFA) 108 (90 BASE) MCG/ACT inhaler Inhale 2 puffs into the lungs every 4 (four) hours as needed. For shortness of breath      . ALPRAZolam (XANAX) 1 MG tablet Take 1 mg by mouth 3 (three) times daily.      . furosemide (LASIX) 20 MG tablet Take 1 tablet (20 mg total) by mouth daily.  30 tablet  0  . metoprolol (LOPRESSOR) 100 MG tablet Take 1 tablet (100 mg total) by mouth 2 (two) times daily.  60 tablet  0  . potassium chloride (K-DUR) 10 MEQ tablet Take 1 tablet (10 mEq total) by mouth 2 (two) times daily.  30 tablet  0   No current facility-administered medications for this visit.    Allergies  Allergen Reactions  . Vancomycin Cross Reactors Anaphylaxis    Review of Systems Long history of hypertension and obesity She's not a current or previous smoker No history thoracic trauma, pneumothorax or hemoptysis She has had several abdominal operations including cholecystectomy hernia repair and hysterectomy She denies diabetes. She is right-hand dominant. She denies previous stroke. She has poor teeth in her maxilla.   BP 160/107  Pulse 65  Resp 20  Ht 4' 11"  (1.499 m)   Wt 224 lb (101.606 kg)  BMI 45.22 kg/m2  SpO2 94% Physical Exam General appearance middle-aged female morbidly obese comfortable and responsive HEENT normocephalic pupils equal dentition poor Neck without JVD mass or bruit, no adenopathy Thorax diminished breath sounds no wheezes no thoracic deformity noted Cardiac regular rhythm no murmur Abdomen morbidly obese no palpable mass appreciated Extremities mild clubbing and no cyanosis edema or tenderness Peripheral pulses palpable radial pulses nonpalpable pedal pulses Neuro no focal motor deficit   Diagnostic Tests: CT scan of the chest is reviewed. Never had a previous CT scan compare. She has a fusiform dilatation which is mild of the ascending aorta which measures 4.3 cm. Her descending thoracic aorta appears normal. She is no pulmonary masses or abnormal mediastinal nodes.  Impression: Mild fusiform aneurysm of the ascending aorta measuring 4.3 cm. Best treatment at this stage is nonsurgical with blood pressure control, weight control, and avoidance of smoke Inhalation  Surgery is recommended when the transverse diameter reaches 5.5 cm.  The patient will be followed with an annual CTA. Hopefully better blood pressure control can be initiated when she connects with a new primary care physician.  Plan: Return in one year with CTA of the chesttofollow mild fusiform ascending thoracic aneurysm

## 2013-01-07 ENCOUNTER — Other Ambulatory Visit: Payer: Self-pay | Admitting: *Deleted

## 2013-01-07 DIAGNOSIS — R002 Palpitations: Secondary | ICD-10-CM

## 2013-01-09 ENCOUNTER — Encounter: Payer: Self-pay | Admitting: Cardiology

## 2013-01-09 ENCOUNTER — Telehealth: Payer: Self-pay | Admitting: *Deleted

## 2013-01-09 ENCOUNTER — Ambulatory Visit (INDEPENDENT_AMBULATORY_CARE_PROVIDER_SITE_OTHER): Payer: Medicaid Other | Admitting: Cardiology

## 2013-01-09 VITALS — BP 142/100 | HR 64 | Ht 62.0 in | Wt 226.1 lb

## 2013-01-09 DIAGNOSIS — J45909 Unspecified asthma, uncomplicated: Secondary | ICD-10-CM

## 2013-01-09 DIAGNOSIS — I10 Essential (primary) hypertension: Secondary | ICD-10-CM

## 2013-01-09 DIAGNOSIS — I712 Thoracic aortic aneurysm, without rupture, unspecified: Secondary | ICD-10-CM

## 2013-01-09 DIAGNOSIS — R002 Palpitations: Secondary | ICD-10-CM

## 2013-01-09 DIAGNOSIS — G4733 Obstructive sleep apnea (adult) (pediatric): Secondary | ICD-10-CM | POA: Insufficient documentation

## 2013-01-09 MED ORDER — POTASSIUM CHLORIDE ER 10 MEQ PO TBCR: 10.0000 meq | EXTENDED_RELEASE_TABLET | Freq: Two times a day (BID) | ORAL | Status: DC

## 2013-01-09 MED ORDER — ALPRAZOLAM 1 MG PO TABS: 1.0000 mg | ORAL_TABLET | Freq: Three times a day (TID) | ORAL | Status: DC

## 2013-01-09 MED ORDER — LISINOPRIL 20 MG PO TABS: 20.0000 mg | ORAL_TABLET | Freq: Every day | ORAL | Status: DC

## 2013-01-09 MED ORDER — FUROSEMIDE 20 MG PO TABS: 20.0000 mg | ORAL_TABLET | Freq: Every day | ORAL | Status: DC

## 2013-01-09 MED ORDER — METOPROLOL TARTRATE 100 MG PO TABS: 100.0000 mg | ORAL_TABLET | Freq: Two times a day (BID) | ORAL | Status: DC

## 2013-01-09 NOTE — Progress Notes (Deleted)
Name: Sabrina Curry    DOB: 1951-02-15  Age: 62 y.o.  MR#: [id number]       PCP:  Neale Burly, MD      Insurance: Payor: MEDICAID [place]  Plan: MEDICAID [place] ACCESS  Product Type: *No Product type*    CC:   No chief complaint on file.  medications VS Filed Vitals:   01/09/13 1338  BP: 142/100  Pulse: 64  Height: 5' 2"  (1.575 m)  Weight: 226 lb 1.3 oz (102.549 kg)    Weights Current Weight  01/09/13 226 lb 1.3 oz (102.549 kg)  12/24/12 224 lb (101.606 kg)  12/08/12 224 lb 10.4 oz (101.9 kg)    Blood Pressure  BP Readings from Last 3 Encounters:  01/09/13 142/100  12/24/12 160/107  12/09/12 152/87     Admit date:  (Not on file) Last encounter with RMR:  Visit date not found   Allergy Vancomycin cross reactors  Current Outpatient Prescriptions  Medication Sig Dispense Refill  . albuterol (PROVENTIL HFA;VENTOLIN HFA) 108 (90 BASE) MCG/ACT inhaler Inhale 2 puffs into the lungs every 4 (four) hours as needed. For shortness of breath      . ALPRAZolam (XANAX) 1 MG tablet Take 1 mg by mouth 3 (three) times daily.      . furosemide (LASIX) 20 MG tablet Take 1 tablet (20 mg total) by mouth daily.  30 tablet  0  . metoprolol (LOPRESSOR) 100 MG tablet Take 1 tablet (100 mg total) by mouth 2 (two) times daily.  60 tablet  0  . potassium chloride (K-DUR) 10 MEQ tablet Take 1 tablet (10 mEq total) by mouth 2 (two) times daily.  30 tablet  0   No current facility-administered medications for this visit.    Discontinued Meds:   There are no discontinued medications.  Patient Active Problem List  Diagnosis  . Palpitations  . Morbid obesity  . Asthma, chronic  . Hypertension  . Obstructive sleep apnea  . Thoracic aortic aneurysm    LABS    Component Value Date/Time   NA 143 12/08/2012 0446   NA 142 12/07/2012 1647   NA 142 01/12/2012 1320   K 3.5 12/08/2012 0446   K 3.6 12/07/2012 1647   K 4.1 01/12/2012 1320   CL 106 12/08/2012 0446   CL 103 12/07/2012 1647   CL 105  01/12/2012 1320   CO2 28 12/08/2012 0446   CO2 28 12/07/2012 1647   CO2 27 01/12/2012 1320   GLUCOSE 109* 12/08/2012 0446   GLUCOSE 105* 12/07/2012 1647   GLUCOSE 91 01/12/2012 1320   BUN 14 12/08/2012 0446   BUN 14 12/07/2012 1647   BUN 17 01/12/2012 1320   CREATININE 0.85 12/08/2012 0446   CREATININE 0.89 12/07/2012 1647   CREATININE 0.84 01/12/2012 1320   CALCIUM 9.1 12/08/2012 0446   CALCIUM 9.6 12/07/2012 1647   CALCIUM 9.8 01/12/2012 1320   GFRNONAA 74* 12/08/2012 0446   GFRNONAA 70* 12/07/2012 1647   GFRNONAA 75* 01/12/2012 1320   GFRAA 85* 12/08/2012 0446   GFRAA 81* 12/07/2012 1647   GFRAA 87* 01/12/2012 1320   CMP     Component Value Date/Time   NA 143 12/08/2012 0446   K 3.5 12/08/2012 0446   CL 106 12/08/2012 0446   CO2 28 12/08/2012 0446   GLUCOSE 109* 12/08/2012 0446   BUN 14 12/08/2012 0446   CREATININE 0.85 12/08/2012 0446   CALCIUM 9.1 12/08/2012 0446   PROT 7.8 12/07/2012 1647  ALBUMIN 4.0 12/07/2012 1647   AST 13 12/07/2012 1647   ALT <5 12/07/2012 1647   ALKPHOS 78 12/07/2012 1647   BILITOT 0.5 12/07/2012 1647   GFRNONAA 74* 12/08/2012 0446   GFRAA 85* 12/08/2012 0446       Component Value Date/Time   WBC 7.3 12/08/2012 0446   WBC 9.4 12/07/2012 1647   WBC 9.5 11/26/2007 0505   HGB 13.3 12/08/2012 0446   HGB 14.8 12/07/2012 1647   HGB 14.3 01/12/2012 1320   HCT 40.5 12/08/2012 0446   HCT 43.9 12/07/2012 1647   HCT 44.5 01/12/2012 1320   MCV 94.2 12/08/2012 0446   MCV 93.2 12/07/2012 1647   MCV 91.3 11/26/2007 0505    Lipid Panel  No results found for this basename: chol, trig, hdl, cholhdl, vldl, ldlcalc    ABG No results found for this basename: phart, pco2, pco2art, po2, po2art, hco3, tco2, acidbasedef, o2sat     Lab Results  Component Value Date   TSH 2.378 12/07/2012   BNP (last 3 results)  Recent Labs  12/08/12 0446  PROBNP 536.3*   Cardiac Panel (last 3 results) No results found for this basename: CKTOTAL, CKMB, TROPONINI, RELINDX,  in the last 72 hours   Iron/TIBC/Ferritin No results found for this basename: iron, tibc, ferritin     EKG Orders placed in visit on 01/07/13  . CARDIAC EVENT MONITOR     Prior Assessment and Plan Problem List as of 01/09/2013     ICD-9-CM     Cardiology Problems   Hypertension   Thoracic aortic aneurysm     Other   Palpitations   Morbid obesity   Asthma, chronic   Obstructive sleep apnea       Imaging: No results found.

## 2013-01-09 NOTE — Telephone Encounter (Signed)
Pt calls for medication refills to be sent to [facility]. I have spoken with pharmacist, Ovid Curd, & medication refills sent Horton Chin RN

## 2013-01-09 NOTE — Patient Instructions (Addendum)
Start taking Lisinopril 61m daily  Your physician recommends that you schedule a follow-up appointment in: 3 weeks with Dr. RLattie Haw Your physician recommends that you return for lab work in: 3 weeks for a BMP.  We will call you with your results  Please call our office if you are experiencing any prolonged palpitations

## 2013-01-09 NOTE — Assessment & Plan Note (Addendum)
Blood pressure slightly elevated. She likely requires a second antihypertensive agent. Lisinopril will be added.

## 2013-01-09 NOTE — Assessment & Plan Note (Signed)
Weight loss and exercise advised.

## 2013-01-09 NOTE — Progress Notes (Signed)
Patient ID: Sabrina Curry, female   DOB: [birth year], 62 y.o.   MRN: [id number] HPI: Initial Cardiology evaluation for this young woman referred from the [facility] ED for assessment of tachycardia.  Patient reports episodes of associated with dyspnea and malaise but no syncope or severe dizziness.  She has had no significant chest discomfort. She particularly notes these symptoms when she exercises. She was provided with an event recorder pending this office evaluation which revealed a single self-limited 8 second episode of SVT at a rate of approximately 190 bpm. No symptoms were reported; however, the patient indicates that she had many symptomatic spells but neglected to activate the recorder.  She has a lifelong history of hypertension that has apparently not been adequately controlled. She does not currently have a primary care doctor, as she no longer wishes to receive care from Dr. Jaquita Folds.  Current Outpatient Prescriptions on File Prior to Visit  Medication Sig Dispense Refill  . albuterol (PROVENTIL HFA;VENTOLIN HFA) 108 (90 BASE) MCG/ACT inhaler Inhale 2 puffs into the lungs every 4 (four) hours as needed. For shortness of breath      . ALPRAZolam (XANAX) 1 MG tablet Take 1 mg by mouth 3 (three) times daily.      . furosemide (LASIX) 20 MG tablet Take 1 tablet (20 mg total) by mouth daily.  30 tablet  0  . metoprolol (LOPRESSOR) 100 MG tablet Take 1 tablet (100 mg total) by mouth 2 (two) times daily.  60 tablet  0  . potassium chloride (K-DUR) 10 MEQ tablet Take 1 tablet (10 mEq total) by mouth 2 (two) times daily.  30 tablet  0   No current facility-administered medications on file prior to visit.   Allergies  Allergen Reactions  . Vancomycin Cross Reactors Anaphylaxis    Past Medical History  Diagnosis Date  . Asthma   . Anxiety   . GERD (gastroesophageal reflux disease)   . Headache   . Arthritis   . Chronic back pain   . Hypertension   . Obstructive sleep apnea     On CPAP by nasal  prong  . Thoracic aortic aneurysm 12/2012     12/2012: 4.3 cm fusiform aneurysm-ascending thoracic aorta  . Palpitations 12/2012    ? SVT    Past Surgical History  Procedure Laterality Date  . Abdominal hysterectomy    . Cholecystectomy      [facility]  . Back surgery      had staff infection of back  . Umbilical hernia repair    . Hernia repair  2009    inscional hernia repair x2-3  . Appendectomy      with gallbladder removal  . Cataract extraction w/phaco  01/16/2012    Procedure: CATARACT EXTRACTION PHACO AND INTRAOCULAR LENS PLACEMENT (IOC);  Surgeon: Elta Guadeloupe T. Gershon Crane, MD;  Location: [facility];  Service: Ophthalmology;  Laterality: Right;  CDE 6.12    Family History  Problem Relation Age of Onset  . Anesthesia problems Neg Hx   . Hypotension Neg Hx   . Pseudochol deficiency Neg Hx   . Malignant hyperthermia Neg Hx   . Hypertension Mother   . Diabetes Mother     History   Social History  . Marital Status: Married    Spouse Name: N/A    Number of Children: N/A  . Years of Education: N/A   Occupational History  . Not on file.   Social History Main Topics  . Smoking status: Never Smoker   .  Smokeless tobacco: Not on file  . Alcohol Use: No  . Drug Use: No  . Sexually Active: Yes    Birth Control/ Protection: Post-menopausal, Surgical   Other Topics Concern  . Not on file   Social History Narrative  . No narrative on file     ROS: Denies orthopnea, PND, loss of consciousness. She has occasional mild pedal edema. She develops dyspnea and lightheadedness when bending over.  She experiences palpitations and dyspnea with mild exertion. Patient describes a low level of energy, intermittent heartburn, colonoscopy within the past 10 years, arthritic knees, dizziness, migraines. All other systems reviewed and are negative.  PHYSICAL EXAM: BP 142/100  Pulse 64  Ht 5' 2"  (1.575 m)  Wt 102.549 kg (226 lb 1.3 oz)  BMI 41.34 kg/m2;  Body mass index is 41.34 kg/(m^2).    General-Well-developed; no acute distress Body Habitus-obese HEENT-[place]/AT; PERRL; EOM intact; conjunctiva and lids nl Neck-No JVD; no carotid bruits Endocrine-No thyromegaly Lungs-Clear lung fields; resonant percussion; normal I-to-E ratio Cardiovascular- normal PMI; normal S1 and S2 Abdomen-BS normal; soft and non-tender without masses or organomegaly Musculoskeletal-No deformities, cyanosis or clubbing Neurologic-Nl cranial nerves; symmetric strength and tone Skin- Warm, no significant lesions Extremities-Nl distal pulses; no edema  EKG:  Tracing obtained in the emergency room on 12/07/12 reviewed. Sinus rhythm at a rate of 101 with frequent PACs; minor nonspecific ST segment abnormality. Rhythm strip recorded 12/07/12: Sinus rhythm with frequent PACs and short runs of SVT.  Rhythm strip: Sinus bradycardia at a rate of 57; sinus arrhythmia; first-degree AV block with a PR interval of 280 ms. 6 minute walk: Traversed 300 feet; stopped with fatigue; noted some heart irregularity but no prominent palpitations; O2 sat 91-93%  Jacqulyn Ducking, MD 01/09/2013  1:55 PM  ASSESSMENT AND PLAN

## 2013-01-09 NOTE — Assessment & Plan Note (Signed)
Patient is typically not symptomatic and uses albuterol infrequently on a when necessary basis.

## 2013-01-09 NOTE — Assessment & Plan Note (Addendum)
A few brief episodes of PSVT were captured on the event recorder. Association with symptoms was uncertain. Patient has exertional symptoms that do not reflect arrhythmia, at least when she performed exercises in the office. She indicates that she is incapable of treadmill exercise. With moderate prolongation of the PR interval, I'm not inclined to further increase her dose metoprolol or to add diltiazem.  The benign nature of her arrhythmia was described to her and the option of RFA if necessary.

## 2013-01-14 ENCOUNTER — Other Ambulatory Visit (INDEPENDENT_AMBULATORY_CARE_PROVIDER_SITE_OTHER): Payer: Medicaid Other | Admitting: *Deleted

## 2013-01-14 DIAGNOSIS — I10 Essential (primary) hypertension: Secondary | ICD-10-CM

## 2013-01-21 ENCOUNTER — Encounter: Payer: Self-pay | Admitting: *Deleted

## 2013-01-28 ENCOUNTER — Other Ambulatory Visit: Payer: Self-pay | Admitting: Cardiology

## 2013-01-29 LAB — BASIC METABOLIC PANEL
Chloride: 105 mEq/L (ref 96–112)
Potassium: 4.1 mEq/L (ref 3.5–5.3)
Sodium: 141 mEq/L (ref 135–145)

## 2013-01-30 ENCOUNTER — Encounter: Payer: Self-pay | Admitting: Cardiology

## 2013-01-30 ENCOUNTER — Ambulatory Visit (INDEPENDENT_AMBULATORY_CARE_PROVIDER_SITE_OTHER): Payer: Medicaid Other | Admitting: Cardiology

## 2013-01-30 ENCOUNTER — Encounter: Payer: Self-pay | Admitting: *Deleted

## 2013-01-30 VITALS — BP 152/110 | HR 87 | Ht 62.0 in | Wt 226.0 lb

## 2013-01-30 DIAGNOSIS — R21 Rash and other nonspecific skin eruption: Secondary | ICD-10-CM

## 2013-01-30 DIAGNOSIS — I10 Essential (primary) hypertension: Secondary | ICD-10-CM

## 2013-01-30 DIAGNOSIS — J45909 Unspecified asthma, uncomplicated: Secondary | ICD-10-CM

## 2013-01-30 MED ORDER — LISINOPRIL 40 MG PO TABS: 40.0000 mg | ORAL_TABLET | Freq: Every day | ORAL | Status: DC

## 2013-01-30 MED ORDER — AMLODIPINE BESYLATE 5 MG PO TABS: 5.0000 mg | ORAL_TABLET | Freq: Every day | ORAL | Status: DC

## 2013-01-30 MED ORDER — CHLORTHALIDONE 25 MG PO TABS: 25.0000 mg | ORAL_TABLET | Freq: Every day | ORAL | Status: DC

## 2013-01-30 MED ORDER — POTASSIUM CHLORIDE ER 10 MEQ PO TBCR: 10.0000 meq | EXTENDED_RELEASE_TABLET | Freq: Two times a day (BID) | ORAL | Status: DC

## 2013-01-30 NOTE — Assessment & Plan Note (Signed)
Asthma diagnosis appears questionable, and certainly not very active. Patient continues to use albuterol occasionally. I doubt that metoprolol is exacerbating this problem

## 2013-01-30 NOTE — Progress Notes (Addendum)
Patient ID: Sabrina Curry, female   DOB: 1951/09/01, 62 y.o.   MRN: [id number]  HPI: Schedule return visit for continued assessment of supraventricular tachycardia and hypertension. Since her last visit, patient has done generally well. She monitors blood pressure at home, but did not bring a list of her findings. She reports that all systolic and diastolic pressures have been elevated. She has not experienced any recurrent palpitations.  Current Outpatient Prescriptions  Medication Sig Dispense Refill  . metoprolol (LOPRESSOR) 100 MG tablet Take 1 tablet (100 mg total) by mouth 2 (two) times daily.  60 tablet  6  . potassium chloride (K-DUR) 10 MEQ tablet Take 1 tablet (10 mEq total) by mouth 2 (two) times daily.  30 tablet  6  . ALPRAZolam (XANAX) 1 MG tablet Take 1 tablet (1 mg total) by mouth 3 (three) times daily.  30 tablet  3  . amLODipine (NORVASC) 5 MG tablet Take 1 tablet (5 mg total) by mouth daily.  30 tablet  6  . chlorthalidone (HYGROTON) 25 MG tablet Take 1 tablet (25 mg total) by mouth daily.  30 tablet  6  . lisinopril (PRINIVIL,ZESTRIL) 40 MG tablet Take 1 tablet (40 mg total) by mouth daily.  90 tablet  1   No current facility-administered medications for this visit.   Allergies  Allergen Reactions  . Vancomycin Cross Reactors Anaphylaxis     Past medical history, social history, and family history reviewed and updated.  ROS: Denies chest pain, orthopnea, PND, lightheadedness or syncope. She notes mild peripheral edema. She has experienced generalized malaise and fatigue. She has developed a skin rash limited to her face, which has developed since starting metoprolol, but was already present when ACE inhibitor was added. All other systems reviewed and are negative  PHYSICAL EXAM: BP 152/110  Pulse 87  Ht 5' 2"  (1.575 m)  Wt 102.513 kg (226 lb)  BMI 41.33 kg/m2  SpO2 96%;  Body mass index is 41.33 kg/(m^2). General-Well developed; no acute distress Body  habitus-moderately to markedly overweight Neck-No JVD; no carotid bruits Lungs-clear lung fields; resonant to percussion Cardiovascular-normal PMI; normal S1 and S2 Abdomen-normal bowel sounds; soft and non-tender without masses or organomegaly Musculoskeletal-No deformities, no cyanosis or clubbing Neurologic-Normal cranial nerves; symmetric strength and tone Skin-Warm, no significant lesions Extremities-distal pulses intact; no edema  Jacqulyn Ducking, MD 01/30/2013  2:04 PM  ASSESSMENT AND PLAN

## 2013-01-30 NOTE — Assessment & Plan Note (Signed)
Blood pressure not adequately controlled and is currently the principal therapeutic target of our efforts. Amlodipine will be added to her regime. Chlorthalidone will be substituted for furosemide. Dose of lisinopril will be increased.

## 2013-01-30 NOTE — Progress Notes (Deleted)
Name: Sabrina Curry    DOB: 1950/11/20  Age: 62 y.o.  MR#: [id number]       PCP:  No primary provider on file.      Insurance: Payor: MEDICAID [place] / Plan: MEDICAID OF [place] / Product Type: *No Product type* /   CC:   No chief complaint on file.  BOTTLES-PT ADVISED CMA SHE HAS RASH ON HER FACE DUE TO REACTIONS FROM MEDICATION AND NO OTC CREAM HAS HELPED  VS Filed Vitals:   01/30/13 1111  BP: 152/110  Pulse: 87  Height: 5' 2"  (1.575 m)  Weight: 226 lb (102.513 kg)  SpO2: 96%    Weights Current Weight  01/30/13 226 lb (102.513 kg)  01/09/13 226 lb 1.3 oz (102.549 kg)  12/24/12 224 lb (101.606 kg)    Blood Pressure  BP Readings from Last 3 Encounters:  01/30/13 152/110  01/09/13 142/100  12/24/12 160/107     Admit date:  (Not on file) Last encounter with RMR:  01/09/2013   Allergy Vancomycin cross reactors  Current Outpatient Prescriptions  Medication Sig Dispense Refill  . furosemide (LASIX) 20 MG tablet Take 1 tablet (20 mg total) by mouth daily.  30 tablet  6  . lisinopril (PRINIVIL,ZESTRIL) 20 MG tablet Take 1 tablet (20 mg total) by mouth daily.  90 tablet  3  . metoprolol (LOPRESSOR) 100 MG tablet Take 1 tablet (100 mg total) by mouth 2 (two) times daily.  60 tablet  6  . potassium chloride (K-DUR) 10 MEQ tablet Take 1 tablet (10 mEq total) by mouth 2 (two) times daily.  30 tablet  0  . ALPRAZolam (XANAX) 1 MG tablet Take 1 tablet (1 mg total) by mouth 3 (three) times daily.  30 tablet  3   No current facility-administered medications for this visit.    Discontinued Meds:    Medications Discontinued During This Encounter  Medication Reason  . albuterol (PROVENTIL HFA;VENTOLIN HFA) 108 (90 BASE) MCG/ACT inhaler Completed Course    Patient Active Problem List   Diagnosis Date Noted  . Obstructive sleep apnea   . Thoracic aortic aneurysm 12/17/2012  . Paroxysmal supraventricular tachycardia-nonsustained 12/08/2012  . Morbid obesity 12/08/2012  . Asthma, chronic  12/08/2012  . Hypertension 12/08/2012    LABS    Component Value Date/Time   NA 141 01/28/2013 1423   NA 143 12/08/2012 0446   NA 142 12/07/2012 1647   K 4.1 01/28/2013 1423   K 3.5 12/08/2012 0446   K 3.6 12/07/2012 1647   CL 105 01/28/2013 1423   CL 106 12/08/2012 0446   CL 103 12/07/2012 1647   CO2 27 01/28/2013 1423   CO2 28 12/08/2012 0446   CO2 28 12/07/2012 1647   GLUCOSE 90 01/28/2013 1423   GLUCOSE 109* 12/08/2012 0446   GLUCOSE 105* 12/07/2012 1647   BUN 17 01/28/2013 1423   BUN 14 12/08/2012 0446   BUN 14 12/07/2012 1647   CREATININE 0.93 01/28/2013 1423   CREATININE 0.85 12/08/2012 0446   CREATININE 0.89 12/07/2012 1647   CREATININE 0.84 01/12/2012 1320   CALCIUM 10.1 01/28/2013 1423   CALCIUM 9.1 12/08/2012 0446   CALCIUM 9.6 12/07/2012 1647   GFRNONAA 74* 12/08/2012 0446   GFRNONAA 70* 12/07/2012 1647   GFRNONAA 75* 01/12/2012 1320   GFRAA 85* 12/08/2012 0446   GFRAA 81* 12/07/2012 1647   GFRAA 87* 01/12/2012 1320   CMP     Component Value Date/Time   NA 141 01/28/2013 1423   K  4.1 01/28/2013 1423   CL 105 01/28/2013 1423   CO2 27 01/28/2013 1423   GLUCOSE 90 01/28/2013 1423   BUN 17 01/28/2013 1423   CREATININE 0.93 01/28/2013 1423   CREATININE 0.85 12/08/2012 0446   CALCIUM 10.1 01/28/2013 1423   PROT 7.8 12/07/2012 1647   ALBUMIN 4.0 12/07/2012 1647   AST 13 12/07/2012 1647   ALT <5 12/07/2012 1647   ALKPHOS 78 12/07/2012 1647   BILITOT 0.5 12/07/2012 1647   GFRNONAA 74* 12/08/2012 0446   GFRAA 85* 12/08/2012 0446       Component Value Date/Time   WBC 7.3 12/08/2012 0446   WBC 9.4 12/07/2012 1647   WBC 9.5 11/26/2007 0505   HGB 13.3 12/08/2012 0446   HGB 14.8 12/07/2012 1647   HGB 14.3 01/12/2012 1320   HCT 40.5 12/08/2012 0446   HCT 43.9 12/07/2012 1647   HCT 44.5 01/12/2012 1320   MCV 94.2 12/08/2012 0446   MCV 93.2 12/07/2012 1647   MCV 91.3 11/26/2007 0505    Lipid Panel  No results found for this basename: chol, trig, hdl, cholhdl, vldl, ldlcalc    ABG No results found for  this basename: phart, pco2, pco2art, po2, po2art, hco3, tco2, acidbasedef, o2sat     Lab Results  Component Value Date   TSH 2.378 12/07/2012   BNP (last 3 results)  Recent Labs  12/08/12 0446  PROBNP 536.3*   Cardiac Panel (last 3 results) No results found for this basename: CKTOTAL, CKMB, TROPONINI, RELINDX,  in the last 72 hours  Iron/TIBC/Ferritin No results found for this basename: iron, tibc, ferritin     EKG Orders placed in visit on 01/07/13  . CARDIAC EVENT MONITOR     Prior Assessment and Plan Problem List as of 01/30/2013   Paroxysmal supraventricular tachycardia-nonsustained   Last Assessment & Plan   01/09/2013 Office Visit Edited 01/12/2013  9:18 AM by Yehuda Savannah, MD     A few brief episodes of PSVT were captured on the event recorder. Association with symptoms was uncertain. Patient has exertional symptoms that do not reflect arrhythmia, at least when she performed exercises in the office. She indicates that she is incapable of treadmill exercise. With moderate prolongation of the PR interval, I'm not inclined to further increase her dose metoprolol or to add diltiazem.  The benign nature of her arrhythmia was described to her and the option of RFA if necessary.    Morbid obesity   Last Assessment & Plan   01/09/2013 Office Visit Written 01/09/2013  2:34 PM by Yehuda Savannah, MD     Weight loss and exercise advised.    Asthma, chronic   Last Assessment & Plan   01/09/2013 Office Visit Written 01/09/2013  2:33 PM by Yehuda Savannah, MD     Patient is typically not symptomatic and uses albuterol infrequently on a when necessary basis.    Hypertension   Last Assessment & Plan   01/09/2013 Office Visit Edited 01/09/2013  2:34 PM by Yehuda Savannah, MD     Blood pressure slightly elevated. She likely requires a second antihypertensive agent. Lisinopril will be added.    Obstructive sleep apnea   Thoracic aortic aneurysm       Imaging: No results  found.

## 2013-01-30 NOTE — Patient Instructions (Addendum)
Your physician recommends that you schedule a follow-up appointment in: 3 weeks with RR  Your physician recommends that you return for lab work in: 3 weeks The patient's paper medical record is not available during this visit. It has been removed from this office and cannot be located. BMET  Your physician has requested that you regularly monitor and record your blood pressure readings at home. Please use the same machine at the same time of day to check your readings and record them to bring to your follow-up visit.  Your physician recommends that you have a referral to dermatology A staff member from our office will alert you the with appointment date and time, once available  Your physician has recommended you make the following change in your medication:   1) STOP TAKING LASIX 2) START TAKING AMLODIPINE 5MG ONCE DAILY 3) START TAKING CHLORTHALIDONE 25MG ONCE DAILY 4) INCREASE LISINOPRIL TO 40MG ONCE DAILY   Your physician recommends THAT YOU TRY OTC CORTISONE CREAM TO [facility]

## 2013-02-05 NOTE — Assessment & Plan Note (Signed)
Papular rash over the malar eminences. Due to its very restricted distribution, doubt that this is a drug rash, but furosemide will be discontinued for now with substitution of chlorthalidone.  Seborrhea, rosacea and atopic dermatitis are among the diagnostic possibilities. Patient advised to use an OTC steroid cream and to seek attention from a dermatologist if rash persists.

## 2013-02-20 ENCOUNTER — Telehealth: Payer: Self-pay | Admitting: *Deleted

## 2013-02-20 DIAGNOSIS — R21 Rash and other nonspecific skin eruption: Secondary | ICD-10-CM

## 2013-02-20 NOTE — Telephone Encounter (Signed)
ALPRAZolam (XANAX) 1 MG tablet  Needs refill called in to [facility].  Also asked about referral to dermatology.  Please call patient regarding both.

## 2013-02-21 ENCOUNTER — Ambulatory Visit: Payer: Medicaid Other | Admitting: Cardiology

## 2013-02-21 NOTE — Telephone Encounter (Signed)
Noted following notation from last OV 01-30-13:  Skin eruption - Yehuda Savannah, MD at 02/05/2013 10:25 PM   Status: Written Related Problem: Skin eruption        Papular rash over the malar eminences. Due to its very restricted distribution, doubt that this is a drug rash, but furosemide will be discontinued for now with substitution of chlorthalidone. Seborrhea, rosacea and atopic dermatitis are among the diagnostic possibilities. Patient advised to use an OTC steroid cream and to seek attention from a dermatologist if rash persists.        Placed referral for derm in chart, will alert pt A staff member from our office will alert you the with appointment date and time, once available  Unable to locate notations as to DR RR request for pt to have refilled on 01-09-13, please advise if ok to send more based on sig of TID and noted nurse DG sent pt only 30 tablets, also advise if ok to place referral for derm

## 2013-02-23 NOTE — Telephone Encounter (Signed)
It does not appear that Cardiology initially ordered Xanax. In any case, we cannot manage high-dose continuous therapy, which she was previously taking. This will need to be provided by a primary care physician.

## 2013-02-24 ENCOUNTER — Ambulatory Visit: Payer: Medicaid Other | Admitting: Cardiology

## 2013-02-24 NOTE — Telephone Encounter (Signed)
Spoke to pt to advise results/instructions. Pt understood. Placed referral in chart for derm, A staff member from our office will alert you the with appointment date and time, once available

## 2013-02-26 ENCOUNTER — Other Ambulatory Visit: Payer: Self-pay | Admitting: *Deleted

## 2013-02-26 DIAGNOSIS — I10 Essential (primary) hypertension: Secondary | ICD-10-CM

## 2013-02-28 LAB — BASIC METABOLIC PANEL
BUN: 24 mg/dL — ABNORMAL HIGH (ref 6–23)
CO2: 27 mEq/L (ref 19–32)
Chloride: 108 mEq/L (ref 96–112)
Glucose, Bld: 93 mg/dL (ref 70–99)
Potassium: 4.2 mEq/L (ref 3.5–5.3)

## 2013-03-04 ENCOUNTER — Encounter: Payer: Self-pay | Admitting: Cardiology

## 2013-03-04 ENCOUNTER — Ambulatory Visit (INDEPENDENT_AMBULATORY_CARE_PROVIDER_SITE_OTHER): Payer: Medicaid Other | Admitting: Cardiology

## 2013-03-04 VITALS — BP 141/80 | HR 65 | Ht 60.0 in | Wt 229.8 lb

## 2013-03-04 DIAGNOSIS — I471 Supraventricular tachycardia, unspecified: Secondary | ICD-10-CM

## 2013-03-04 DIAGNOSIS — I10 Essential (primary) hypertension: Secondary | ICD-10-CM

## 2013-03-04 DIAGNOSIS — J4520 Mild intermittent asthma, uncomplicated: Secondary | ICD-10-CM

## 2013-03-04 DIAGNOSIS — J45909 Unspecified asthma, uncomplicated: Secondary | ICD-10-CM

## 2013-03-04 MED ORDER — METOPROLOL TARTRATE 100 MG PO TABS: 100.0000 mg | ORAL_TABLET | Freq: Two times a day (BID) | ORAL | Status: DC

## 2013-03-04 MED ORDER — DILTIAZEM HCL 120 MG PO TABS: 120.0000 mg | ORAL_TABLET | Freq: Every day | ORAL | Status: DC

## 2013-03-04 NOTE — Assessment & Plan Note (Signed)
Blood pressure control is adequate today, but suboptimal based upon patient's measurements. 24-hour ambulatory monitoring will be performed with subsequent adjustment of medication if necessary. Diltiazem will be added to her medical regime in an attempt to control tachycardia as well as hypertension.

## 2013-03-04 NOTE — Assessment & Plan Note (Signed)
No recent symptoms to suggest active asthma.

## 2013-03-04 NOTE — Patient Instructions (Addendum)
Your physician recommends that you schedule a follow-up appointment in: 5 months  Your physician has recommended you make the following change in your medication:   1) START TAKING  DILTIAZEM 120MG ONCE DAILY  Your physician recommends THAT YOU HAVE A 24 HOUR BLOOD PRESSURE MONITOR PLACED ON A staff member from our office will alert you the with appointment date and time, once available

## 2013-03-04 NOTE — Assessment & Plan Note (Signed)
Symptoms not controlled with moderate dose beta blocker. Patient has a borderline first degree AV block and intermittent mild sinus bradycardia.  Low dose diltiazem will be added with monitoring of vital signs and periodic assessment of electrocardiographic intervals.

## 2013-03-04 NOTE — Progress Notes (Deleted)
Name: Sabrina Curry    DOB: 08-12-1951  Age: 62 y.o.  MR#: [id number]       PCP:  No primary provider on file.      Insurance: Payor: MEDICAID [place] / Plan: MEDICAID OF [place] / Product Type: *No Product type* /   CC:    Chief Complaint  Patient presents with  . Hypertension  . Supraventricular tachycardia  bottles  VS Filed Vitals:   03/04/13 1506  BP: 141/80  Pulse: 65  Height: 5' (1.524 m)  Weight: 229 lb 12 oz (104.214 kg)    Weights Current Weight  03/04/13 229 lb 12 oz (104.214 kg)  01/30/13 226 lb (102.513 kg)  01/09/13 226 lb 1.3 oz (102.549 kg)    Blood Pressure  BP Readings from Last 3 Encounters:  03/04/13 141/80  01/30/13 152/110  01/09/13 142/100     Admit date:  (Not on file) Last encounter with RMR:  02/24/2013   Allergy Vancomycin cross reactors  Current Outpatient Prescriptions  Medication Sig Dispense Refill  . ALPRAZolam (XANAX) 1 MG tablet Take 1 tablet (1 mg total) by mouth 3 (three) times daily.  30 tablet  3  . amLODipine (NORVASC) 5 MG tablet Take 1 tablet (5 mg total) by mouth daily.  30 tablet  6  . chlorthalidone (HYGROTON) 25 MG tablet Take 1 tablet (25 mg total) by mouth daily.  30 tablet  6  . lisinopril (PRINIVIL,ZESTRIL) 40 MG tablet Take 1 tablet (40 mg total) by mouth daily.  90 tablet  1  . metoprolol (LOPRESSOR) 100 MG tablet Take 1 tablet (100 mg total) by mouth 2 (two) times daily.  60 tablet  3  . potassium chloride (K-DUR) 10 MEQ tablet Take 1 tablet (10 mEq total) by mouth 2 (two) times daily.  30 tablet  6   No current facility-administered medications for this visit.    Discontinued Meds:    Medications Discontinued During This Encounter  Medication Reason  . metoprolol (LOPRESSOR) 100 MG tablet Reorder    Patient Active Problem List   Diagnosis Date Noted  . Skin eruption 01/30/2013  . Obstructive sleep apnea   . Thoracic aortic aneurysm 12/17/2012  . Paroxysmal supraventricular tachycardia-nonsustained 12/08/2012  .  Morbid obesity 12/08/2012  . Asthma, chronic 12/08/2012  . Hypertension 12/08/2012    LABS    Component Value Date/Time   NA 142 02/26/2013 1437   NA 141 01/28/2013 1423   NA 143 12/08/2012 0446   K 4.2 02/26/2013 1437   K 4.1 01/28/2013 1423   K 3.5 12/08/2012 0446   CL 108 02/26/2013 1437   CL 105 01/28/2013 1423   CL 106 12/08/2012 0446   CO2 27 02/26/2013 1437   CO2 27 01/28/2013 1423   CO2 28 12/08/2012 0446   GLUCOSE 93 02/26/2013 1437   GLUCOSE 90 01/28/2013 1423   GLUCOSE 109* 12/08/2012 0446   BUN 24* 02/26/2013 1437   BUN 17 01/28/2013 1423   BUN 14 12/08/2012 0446   CREATININE 1.17* 02/26/2013 1437   CREATININE 0.93 01/28/2013 1423   CREATININE 0.85 12/08/2012 0446   CREATININE 0.89 12/07/2012 1647   CREATININE 0.84 01/12/2012 1320   CALCIUM 9.4 02/26/2013 1437   CALCIUM 10.1 01/28/2013 1423   CALCIUM 9.1 12/08/2012 0446   GFRNONAA 74* 12/08/2012 0446   GFRNONAA 70* 12/07/2012 1647   GFRNONAA 75* 01/12/2012 1320   GFRAA 85* 12/08/2012 0446   GFRAA 81* 12/07/2012 1647   GFRAA 87* 01/12/2012 1320  CMP     Component Value Date/Time   NA 142 02/26/2013 1437   K 4.2 02/26/2013 1437   CL 108 02/26/2013 1437   CO2 27 02/26/2013 1437   GLUCOSE 93 02/26/2013 1437   BUN 24* 02/26/2013 1437   CREATININE 1.17* 02/26/2013 1437   CREATININE 0.85 12/08/2012 0446   CALCIUM 9.4 02/26/2013 1437   PROT 7.8 12/07/2012 1647   ALBUMIN 4.0 12/07/2012 1647   AST 13 12/07/2012 1647   ALT <5 12/07/2012 1647   ALKPHOS 78 12/07/2012 1647   BILITOT 0.5 12/07/2012 1647   GFRNONAA 74* 12/08/2012 0446   GFRAA 85* 12/08/2012 0446       Component Value Date/Time   WBC 7.3 12/08/2012 0446   WBC 9.4 12/07/2012 1647   WBC 9.5 11/26/2007 0505   HGB 13.3 12/08/2012 0446   HGB 14.8 12/07/2012 1647   HGB 14.3 01/12/2012 1320   HCT 40.5 12/08/2012 0446   HCT 43.9 12/07/2012 1647   HCT 44.5 01/12/2012 1320   MCV 94.2 12/08/2012 0446   MCV 93.2 12/07/2012 1647   MCV 91.3 11/26/2007 0505    Lipid Panel  No results found for this  basename: chol, trig, hdl, cholhdl, vldl, ldlcalc    ABG No results found for this basename: phart, pco2, pco2art, po2, po2art, hco3, tco2, acidbasedef, o2sat     Lab Results  Component Value Date   TSH 2.378 12/07/2012   BNP (last 3 results)  Recent Labs  12/08/12 0446  PROBNP 536.3*   Cardiac Panel (last 3 results) No results found for this basename: CKTOTAL, CKMB, TROPONINI, RELINDX,  in the last 72 hours  Iron/TIBC/Ferritin No results found for this basename: iron, tibc, ferritin     EKG Orders placed in visit on 01/07/13  . CARDIAC EVENT MONITOR     Prior Assessment and Plan Problem List as of 03/04/2013   Paroxysmal supraventricular tachycardia-nonsustained   Last Assessment & Plan   01/09/2013 Office Visit Edited 01/12/2013  9:18 AM by Yehuda Savannah, MD     A few brief episodes of PSVT were captured on the event recorder. Association with symptoms was uncertain. Patient has exertional symptoms that do not reflect arrhythmia, at least when she performed exercises in the office. She indicates that she is incapable of treadmill exercise. With moderate prolongation of the PR interval, I'm not inclined to further increase her dose metoprolol or to add diltiazem.  The benign nature of her arrhythmia was described to her and the option of RFA if necessary.    Morbid obesity   Last Assessment & Plan   01/09/2013 Office Visit Written 01/09/2013  2:34 PM by Yehuda Savannah, MD     Weight loss and exercise advised.    Asthma, chronic   Last Assessment & Plan   01/30/2013 Office Visit Written 01/30/2013  2:10 PM by Yehuda Savannah, MD     Asthma diagnosis appears questionable, and certainly not very active. Patient continues to use albuterol occasionally. I doubt that metoprolol is exacerbating this problem    Hypertension   Last Assessment & Plan   01/30/2013 Office Visit Written 01/30/2013  2:10 PM by Yehuda Savannah, MD     Blood pressure not adequately controlled and  is currently the principal therapeutic target of our efforts. Amlodipine will be added to her regime. Chlorthalidone will be substituted for furosemide. Dose of lisinopril will be increased.    Obstructive sleep apnea   Thoracic aortic aneurysm   Skin  eruption   Last Assessment & Plan   01/30/2013 Office Visit Written 02/05/2013 10:25 PM by Yehuda Savannah, MD     Papular rash over the malar eminences. Due to its very restricted distribution, doubt that this is a drug rash, but furosemide will be discontinued for now with substitution of chlorthalidone.  Seborrhea, rosacea and atopic dermatitis are among the diagnostic possibilities. Patient advised to use an OTC steroid cream and to seek attention from a dermatologist if rash persists.         Imaging: No results found.

## 2013-03-04 NOTE — Progress Notes (Signed)
HPI: Schedule return visit for treatment of hypertension and supraventricular tachycardia. Patient continues to experience a few episodes per week of palpitations and lightheadedness lasting up to 10 minutes. Event recorder identified PSVT at a rate of 200 bpm but lasting only seconds and not associated with symptoms.  Blood pressure monitoring has been performed at home with suboptimal results, but patient neglected to bring her list.  Current Outpatient Prescriptions  Medication Sig Dispense Refill  . ALPRAZolam (XANAX) 1 MG tablet Take 1 tablet (1 mg total) by mouth 3 (three) times daily.  30 tablet  3  . amLODipine (NORVASC) 5 MG tablet Take 1 tablet (5 mg total) by mouth daily.  30 tablet  6  . chlorthalidone (HYGROTON) 25 MG tablet Take 1 tablet (25 mg total) by mouth daily.  30 tablet  6  . lisinopril (PRINIVIL,ZESTRIL) 40 MG tablet Take 1 tablet (40 mg total) by mouth daily.  90 tablet  1  . metoprolol (LOPRESSOR) 100 MG tablet Take 1 tablet (100 mg total) by mouth 2 (two) times daily.  60 tablet  3  . potassium chloride (K-DUR) 10 MEQ tablet Take 1 tablet (10 mEq total) by mouth 2 (two) times daily.  30 tablet  6   No current facility-administered medications for this visit.   Allergies  Allergen Reactions  . Vancomycin Cross Reactors Anaphylaxis     Past medical history, social history, and family history reviewed and updated.  ROS: Denies chest pain, exertional dyspnea or syncope. All other systems reviewed and are negative.  PHYSICAL EXAM: BP 141/80  Pulse 65  Ht 5' (1.524 m)  Wt 104.214 kg (229 lb 12 oz)  BMI 44.87 kg/m2;  Body mass index is 44.87 kg/(m^2). General-Well developed; no acute distress Body habitus-obese Neck-No JVD; no carotid bruits Lungs-clear lung fields; resonant to percussion Cardiovascular-normal PMI; normal S1 and S2; S4 present Abdomen-normal bowel sounds; soft and non-tender without masses or organomegaly Musculoskeletal-No deformities, no  cyanosis or clubbing Neurologic-Normal cranial nerves; symmetric strength and tone Skin-Warm, no significant lesions Extremities-distal pulses intact; no edema  Jacqulyn Ducking, MD 03/04/2013  3:30 PM  ASSESSMENT AND PLAN

## 2013-03-06 ENCOUNTER — Encounter: Payer: Self-pay | Admitting: *Deleted

## 2013-04-01 NOTE — Addendum Note (Signed)
Addended by: Shara Blazing A on: 04/01/2013 11:46 AM   Modules accepted: Orders

## 2013-06-10 ENCOUNTER — Encounter: Payer: Self-pay | Admitting: Cardiovascular Disease

## 2013-07-16 ENCOUNTER — Ambulatory Visit: Payer: Medicaid Other | Admitting: Cardiovascular Disease

## 2013-07-25 ENCOUNTER — Ambulatory Visit: Payer: Medicaid Other | Admitting: Cardiovascular Disease

## 2013-07-31 ENCOUNTER — Encounter: Payer: Self-pay | Admitting: Cardiovascular Disease

## 2013-07-31 ENCOUNTER — Ambulatory Visit (INDEPENDENT_AMBULATORY_CARE_PROVIDER_SITE_OTHER): Payer: Medicaid Other | Admitting: Cardiovascular Disease

## 2013-07-31 VITALS — BP 126/84 | HR 69 | Ht 62.0 in | Wt 226.0 lb

## 2013-07-31 DIAGNOSIS — I471 Supraventricular tachycardia, unspecified: Secondary | ICD-10-CM

## 2013-07-31 DIAGNOSIS — I10 Essential (primary) hypertension: Secondary | ICD-10-CM

## 2013-07-31 DIAGNOSIS — I712 Thoracic aortic aneurysm, without rupture, unspecified: Secondary | ICD-10-CM

## 2013-07-31 DIAGNOSIS — R079 Chest pain, unspecified: Secondary | ICD-10-CM

## 2013-07-31 DIAGNOSIS — R002 Palpitations: Secondary | ICD-10-CM

## 2013-07-31 MED ORDER — ISOSORBIDE MONONITRATE ER 30 MG PO TB24: 30.0000 mg | ORAL_TABLET | Freq: Every day | ORAL | Status: DC

## 2013-07-31 NOTE — Progress Notes (Addendum)
Patient ID: Sabrina Curry, female   DOB: [birth year], 62 y.o.   MRN: [id number]      SUBJECTIVE: The patient has a history of paroxysmal supraventricular tachycardia as well as hypertension. She has a thoracic aortic aneurysm which is followed by cardiothoracic surgery. At her last visit, she was placed on diltiazem both for the control of PSVT as well as hypertension. She says that her blood pressure is under the best control that it has ever been. She experiences chest pain when bending over or heavily exerting herself while bending over. This occurs with routine activities around the house. She occasionally feels chest pressure with exertion, and describes it as an elephant sitting on her chest. She denies ever having had a heart attack. An echocardiogram performed in March 2014 revealed normal left ventricular systolic function with an ejection fraction of 60-65% with grade 2 diastolic dysfunction and mild aortic root and ascending aortic enlargement. She occasionally experiences palpitations.   Allergies  Allergen Reactions  . Vancomycin Cross Reactors Anaphylaxis    Current Outpatient Prescriptions  Medication Sig Dispense Refill  . ALPRAZolam (XANAX) 1 MG tablet Take 1 tablet (1 mg total) by mouth 3 (three) times daily.  30 tablet  3  . amLODipine (NORVASC) 5 MG tablet Take 1 tablet (5 mg total) by mouth daily.  30 tablet  6  . chlorthalidone (HYGROTON) 25 MG tablet Take 1 tablet (25 mg total) by mouth daily.  30 tablet  6  . diltiazem (CARDIZEM) 120 MG tablet Take 1 tablet (120 mg total) by mouth daily.  30 tablet  5  . lisinopril (PRINIVIL,ZESTRIL) 40 MG tablet Take 1 tablet (40 mg total) by mouth daily.  90 tablet  1  . metoprolol (LOPRESSOR) 100 MG tablet Take 1 tablet (100 mg total) by mouth 2 (two) times daily.  60 tablet  3  . potassium chloride (K-DUR) 10 MEQ tablet Take 1 tablet (10 mEq total) by mouth 2 (two) times daily.  30 tablet  6   No current facility-administered  medications for this visit.    Past Medical History  Diagnosis Date  . Asthma   . Anxiety   . GERD (gastroesophageal reflux disease)   . Headache(784.0)   . Arthritis   . Chronic back pain   . Hypertension   . Obstructive sleep apnea     On CPAP by nasal prong  . Thoracic aortic aneurysm 12/2012     12/2012: 4.3 cm fusiform aneurysm-ascending thoracic aorta  . Palpitations 12/2012    ? SVT  . H/O dizziness   . Seasonal allergies   . Hx of migraines   . Knee pain, bilateral   . H/O shortness of breath     Past Surgical History  Procedure Laterality Date  . Abdominal hysterectomy    . Cholecystectomy      [facility]  . Back surgery      had staff infection of back  . Umbilical hernia repair    . Hernia repair  2009    inscional hernia repair x2-3  . Appendectomy      with gallbladder removal  . Cataract extraction w/phaco  01/16/2012    Procedure: CATARACT EXTRACTION PHACO AND INTRAOCULAR LENS PLACEMENT (IOC);  Surgeon: Elta Guadeloupe T. Gershon Crane, MD;  Location: [facility];  Service: Ophthalmology;  Laterality: Right;  CDE 6.12    History   Social History  . Marital Status: Married    Spouse Name: N/A    Number of Children: N/A  .  Years of Education: N/A   Occupational History  . CNA-retired     [employer]   Social History Main Topics  . Smoking status: Never Smoker   . Smokeless tobacco: Not on file  . Alcohol Use: No  . Drug Use: No  . Sexual Activity: Yes    Birth Control/ Protection: Post-menopausal, Surgical   Other Topics Concern  . Not on file   Social History Narrative  . No narrative on file     Filed Vitals:   07/31/13 1052  BP: 126/84  Pulse: 69  Height: 5' 2"  (1.575 m)  Weight: 226 lb (102.513 kg)    PHYSICAL EXAM General: NAD, obese Neck: No JVD, no thyromegaly or thyroid nodule.  Lungs: Clear to auscultation bilaterally with normal respiratory effort. CV: Nondisplaced PMI.  Heart regular S1/S2, no S3/S4, no murmur.  No peripheral  edema.  No carotid bruit.  Normal pedal pulses.  Abdomen: Soft, nontender, no hepatosplenomegaly, no distention.  Neurologic: Alert and oriented x 3.  Psych: Normal affect. Extremities: No clubbing or cyanosis.   ECG: reviewed and available in electronic records.      ASSESSMENT AND PLAN: 1. Chest pain with exertion: While her symptoms are somewhat concerning for ischemic heart disease, she underwent a normal dobutamine stress echocardiogram on 07-10-2012. At that time, she was hospitalized for chest pain at [facility] which was deemed musculoskeletal in etiology. I'll start her on Imdur 30 mg daily. Her blood pressure is currently well controlled. 2. Hypertension: Well controlled on amlodipine 5 mg daily, chlorthalidone 25 mg daily, diltiazem 120 mg daily, lisinopril 40 mg daily and metoprolol 100 mg twice daily. 3. PSVT: Her symptoms appear to be reasonably well controlled on diltiazem 120 mg daily and metoprolol 100 mg twice daily. 4. Thoracic aortic aneurysm: followed by CT surgery. It was mild when most recently evaluated.  Dispo: f/u in 2 months.  Kate Sable, M.D., F.A.C.C.

## 2013-07-31 NOTE — Patient Instructions (Signed)
Your physician recommends that you schedule a follow-up appointment in:  6-8 weeks   Your physician has recommended you make the following change in your medication:   PLEASE start  IMDUR 30 mg by mouth daily

## 2013-09-19 ENCOUNTER — Telehealth: Payer: Self-pay | Admitting: *Deleted

## 2013-09-19 DIAGNOSIS — I10 Essential (primary) hypertension: Secondary | ICD-10-CM

## 2013-09-19 MED ORDER — METOPROLOL TARTRATE 100 MG PO TABS: 100.0000 mg | ORAL_TABLET | Freq: Two times a day (BID) | ORAL | Status: DC

## 2013-09-19 NOTE — Telephone Encounter (Signed)
Pt is out of metoprolol and had no refills please call in to [facility]

## 2013-09-19 NOTE — Telephone Encounter (Signed)
Medication sent via escribe.  

## 2013-09-22 ENCOUNTER — Encounter: Payer: Self-pay | Admitting: Cardiovascular Disease

## 2013-09-22 ENCOUNTER — Ambulatory Visit (INDEPENDENT_AMBULATORY_CARE_PROVIDER_SITE_OTHER): Payer: Medicaid Other | Admitting: Cardiovascular Disease

## 2013-09-22 VITALS — BP 131/71 | HR 90 | Ht 60.0 in | Wt 223.0 lb

## 2013-09-22 DIAGNOSIS — I471 Supraventricular tachycardia, unspecified: Secondary | ICD-10-CM

## 2013-09-22 DIAGNOSIS — I712 Thoracic aortic aneurysm, without rupture, unspecified: Secondary | ICD-10-CM

## 2013-09-22 DIAGNOSIS — F419 Anxiety disorder, unspecified: Secondary | ICD-10-CM

## 2013-09-22 DIAGNOSIS — R079 Chest pain, unspecified: Secondary | ICD-10-CM

## 2013-09-22 DIAGNOSIS — I10 Essential (primary) hypertension: Secondary | ICD-10-CM

## 2013-09-22 DIAGNOSIS — R21 Rash and other nonspecific skin eruption: Secondary | ICD-10-CM

## 2013-09-22 DIAGNOSIS — F411 Generalized anxiety disorder: Secondary | ICD-10-CM

## 2013-09-22 MED ORDER — POTASSIUM CHLORIDE ER 10 MEQ PO TBCR: 10.0000 meq | EXTENDED_RELEASE_TABLET | Freq: Two times a day (BID) | ORAL | Status: DC

## 2013-09-22 MED ORDER — CHLORTHALIDONE 25 MG PO TABS: 25.0000 mg | ORAL_TABLET | Freq: Every day | ORAL | Status: DC

## 2013-09-22 MED ORDER — LISINOPRIL 40 MG PO TABS: 40.0000 mg | ORAL_TABLET | Freq: Every day | ORAL | Status: DC

## 2013-09-22 MED ORDER — DILTIAZEM HCL 120 MG PO TABS: 120.0000 mg | ORAL_TABLET | Freq: Every day | ORAL | Status: DC

## 2013-09-22 MED ORDER — AMLODIPINE BESYLATE 5 MG PO TABS: 5.0000 mg | ORAL_TABLET | Freq: Every day | ORAL | Status: DC

## 2013-09-22 NOTE — Progress Notes (Signed)
Patient ID: Sabrina Curry, female   DOB: 07/27/1951, 63 y.o.   MRN: [id number]      SUBJECTIVE: The Imdur did not help much, and she continues to experience chest pressure. It appears to be aggravated by all the self-professed stress at home, as she takes care of her husband who is on dialysis, as well as her grandchildren. She's not been able to obtain a PCP, as she is on Medicaid. The pain occasionally radiates to the back and she also has right-sided neck pain.    Allergies  Allergen Reactions  . Vancomycin Cross Reactors Anaphylaxis    Current Outpatient Prescriptions  Medication Sig Dispense Refill  . amLODipine (NORVASC) 5 MG tablet Take 1 tablet (5 mg total) by mouth daily.  30 tablet  6  . chlorthalidone (HYGROTON) 25 MG tablet Take 1 tablet (25 mg total) by mouth daily.  30 tablet  6  . diltiazem (CARDIZEM) 120 MG tablet Take 1 tablet (120 mg total) by mouth daily.  30 tablet  5  . isosorbide mononitrate (IMDUR) 30 MG 24 hr tablet Take 1 tablet (30 mg total) by mouth daily.  90 tablet  0  . lisinopril (PRINIVIL,ZESTRIL) 40 MG tablet Take 1 tablet (40 mg total) by mouth daily.  90 tablet  1  . metoprolol (LOPRESSOR) 100 MG tablet Take 1 tablet (100 mg total) by mouth 2 (two) times daily.  60 tablet  3  . potassium chloride (K-DUR) 10 MEQ tablet Take 1 tablet (10 mEq total) by mouth 2 (two) times daily.  30 tablet  6  . ALPRAZolam (XANAX) 1 MG tablet Take 1 tablet (1 mg total) by mouth 3 (three) times daily.  30 tablet  3   No current facility-administered medications for this visit.    Past Medical History  Diagnosis Date  . Asthma   . Anxiety   . GERD (gastroesophageal reflux disease)   . Headache(784.0)   . Arthritis   . Chronic back pain   . Hypertension   . Obstructive sleep apnea     On CPAP by nasal prong  . Thoracic aortic aneurysm 12/2012     12/2012: 4.3 cm fusiform aneurysm-ascending thoracic aorta  . Palpitations 12/2012    ? SVT  . H/O dizziness   . Seasonal  allergies   . Hx of migraines   . Knee pain, bilateral   . H/O shortness of breath     Past Surgical History  Procedure Laterality Date  . Abdominal hysterectomy    . Cholecystectomy      [facility]  . Back surgery      had staff infection of back  . Umbilical hernia repair    . Hernia repair  2009    inscional hernia repair x2-3  . Appendectomy      with gallbladder removal  . Cataract extraction w/phaco  01/16/2012    Procedure: CATARACT EXTRACTION PHACO AND INTRAOCULAR LENS PLACEMENT (IOC);  Surgeon: Elta Guadeloupe T. Gershon Crane, MD;  Location: [facility];  Service: Ophthalmology;  Laterality: Right;  CDE 6.12    History   Social History  . Marital Status: Married    Spouse Name: N/A    Number of Children: N/A  . Years of Education: N/A   Occupational History  . CNA-retired     [employer]   Social History Main Topics  . Smoking status: Never Smoker   . Smokeless tobacco: Not on file  . Alcohol Use: No  . Drug Use: No  .  Sexual Activity: Yes    Birth Control/ Protection: Post-menopausal, Surgical   Other Topics Concern  . Not on file   Social History Narrative  . No narrative on file     Filed Vitals:   09/22/13 1313  BP: 131/71  Pulse: 90  Height: 5' (1.524 m)  Weight: 223 lb (101.152 kg)    PHYSICAL EXAM General: NAD Neck: No JVD, no thyromegaly or thyroid nodule.  Lungs: Clear to auscultation bilaterally with normal respiratory effort. CV: Nondisplaced PMI.  Heart regular S1/S2, no S3/S4, no murmur.  No peripheral edema.  No carotid bruit.  Normal pedal pulses.  Abdomen: Soft, nontender, no hepatosplenomegaly, no distention.  Neurologic: Alert and oriented x 3.  Psych: Normal affect. Extremities: No clubbing or cyanosis.   ECG: reviewed and available in electronic records.      ASSESSMENT AND PLAN: 1. Chest pain: While her symptoms are somewhat concerning for ischemic heart disease, she underwent a normal dobutamine stress echocardiogram on  07-10-2012. At that time, she was hospitalized for chest pain at [facility] which was deemed musculoskeletal in etiology. The Imdur did not help so I will discontinue it. Her blood pressure is currently well controlled. Her pain is likely related to anxiety (generalized anxiety disorder) given all the stressors at home. I will assist her in seeking help through [facility], specifically with mental health. 2. Hypertension: Well controlled on amlodipine 5 mg daily, chlorthalidone 25 mg daily, diltiazem 120 mg daily, lisinopril 40 mg daily and metoprolol 100 mg twice daily.  3. PSVT: Her symptoms appear to be reasonably well controlled on diltiazem 120 mg daily and metoprolol 100 mg twice daily.  4. Thoracic aortic aneurysm: followed by CT surgery. It was mild when most recently evaluated.  Dispo: f/u 6 months.  Kate Sable, M.D., F.A.C.C.

## 2013-09-22 NOTE — Patient Instructions (Addendum)
Your physician wants you to follow-up in: 6 months You will receive a reminder letter in the mail two months in advance. If you don't receive a letter, please call our office to schedule the follow-up appointment    Your physician has recommended you make the following change in your medication: STOP Imdur   Please call [facility] dept to make an apt to see a primary care doctor [phone]

## 2013-12-04 ENCOUNTER — Other Ambulatory Visit: Payer: Self-pay

## 2013-12-04 DIAGNOSIS — I712 Thoracic aortic aneurysm, without rupture, unspecified: Secondary | ICD-10-CM

## 2014-01-21 ENCOUNTER — Ambulatory Visit
Admission: RE | Admit: 2014-01-21 | Discharge: 2014-01-21 | Disposition: A | Discharge status: Home / Self Care | Payer: Medicaid Other | Source: Ambulatory Visit | Attending: Cardiothoracic Surgery | Admitting: Cardiothoracic Surgery

## 2014-01-21 ENCOUNTER — Ambulatory Visit (INDEPENDENT_AMBULATORY_CARE_PROVIDER_SITE_OTHER): Payer: Medicaid Other | Admitting: Cardiothoracic Surgery

## 2014-01-21 ENCOUNTER — Other Ambulatory Visit: Payer: Self-pay | Admitting: Cardiothoracic Surgery

## 2014-01-21 ENCOUNTER — Encounter: Payer: Self-pay | Admitting: Cardiothoracic Surgery

## 2014-01-21 VITALS — BP 160/103 | HR 72 | Resp 20 | Ht 60.0 in | Wt 223.0 lb

## 2014-01-21 DIAGNOSIS — I712 Thoracic aortic aneurysm, without rupture, unspecified: Secondary | ICD-10-CM

## 2014-01-21 DIAGNOSIS — I7121 Aneurysm of the ascending aorta, without rupture: Secondary | ICD-10-CM

## 2014-01-21 LAB — BUN: BUN: 22 mg/dL (ref 6–23)

## 2014-01-21 LAB — CREATININE, SERUM: Creat: 1.3 mg/dL — ABNORMAL HIGH (ref 0.50–1.10)

## 2014-01-21 MED ORDER — IOHEXOL 350 MG/ML SOLN
80.0000 mL | Freq: Once | INTRAVENOUS | Status: AC | PRN
  Administered 2014-01-21: 80 mL via INTRAVENOUS

## 2014-01-21 NOTE — Progress Notes (Signed)
PCP is HASANAJ,XAJE A, MD Referring Provider is Neale Burly, MD  Chief Complaint  Patient presents with  . Thoracic Aortic Aneurysm    1 year f/u with CTA Chest    HPI: Patient examined, CTA of thoracic aorta reviewed Patient returns for one year followup with CT scan after diagnosis of a mild fusiform ascending thoracic aneurysm 4.3 cm diameter 2014. The patient has a history of significant hypertension on multiple medications and closely monitored. The patient has had chest pain in the past and was evaluated by cardiology at [facility] and 2013 with a stress echocardiogram with dobutamine which was negative. In 2014 her echo showed ejection fraction of 60% with a trileaflet aortic valve without aortic stenosis or insufficiency.  CTA of the thoracic aorta today shows a stable ascending fusiform aneurysm stable at 4.3 cm in diameter. No evidence of penetrating ulcer or intramural hematoma.  Patient has history of SVT as well treated medically by her cardiologist Past Medical History  Diagnosis Date  . Asthma   . Anxiety   . GERD (gastroesophageal reflux disease)   . Headache(784.0)   . Arthritis   . Chronic back pain   . Hypertension   . Obstructive sleep apnea     On CPAP by nasal prong  . Thoracic aortic aneurysm 12/2012     12/2012: 4.3 cm fusiform aneurysm-ascending thoracic aorta  . Palpitations 12/2012    ? SVT  . H/O dizziness   . Seasonal allergies   . Hx of migraines   . Knee pain, bilateral   . H/O shortness of breath     Past Surgical History  Procedure Laterality Date  . Abdominal hysterectomy    . Cholecystectomy      [facility]  . Back surgery      had staff infection of back  . Umbilical hernia repair    . Hernia repair  2009    inscional hernia repair x2-3  . Appendectomy      with gallbladder removal  . Cataract extraction w/phaco  01/16/2012    Procedure: CATARACT EXTRACTION PHACO AND INTRAOCULAR LENS PLACEMENT (IOC);  Surgeon: Elta Guadeloupe T.  Gershon Crane, MD;  Location: [facility];  Service: Ophthalmology;  Laterality: Right;  CDE 6.12    Family History  Problem Relation Age of Onset  . Anesthesia problems Neg Hx   . Hypotension Neg Hx   . Pseudochol deficiency Neg Hx   . Malignant hyperthermia Neg Hx   . Hypertension Mother   . Diabetes Mother   . Hyperlipidemia Father     Also hypertension and an aneurysm    Social History History  Substance Use Topics  . Smoking status: Never Smoker   . Smokeless tobacco: Not on file  . Alcohol Use: No    Current Outpatient Prescriptions  Medication Sig Dispense Refill  . ALPRAZolam (XANAX) 1 MG tablet Take 1 tablet (1 mg total) by mouth 3 (three) times daily.  30 tablet  3  . amLODipine (NORVASC) 5 MG tablet Take 1 tablet (5 mg total) by mouth daily.  30 tablet  6  . chlorthalidone (HYGROTON) 25 MG tablet Take 1 tablet (25 mg total) by mouth daily.  30 tablet  6  . diltiazem (CARDIZEM) 120 MG tablet Take 1 tablet (120 mg total) by mouth daily.  30 tablet  5  . lisinopril (PRINIVIL,ZESTRIL) 40 MG tablet Take 1 tablet (40 mg total) by mouth daily.  90 tablet  6  . metoprolol (LOPRESSOR) 100 MG tablet  Take 1 tablet (100 mg total) by mouth 2 (two) times daily.  60 tablet  3  . potassium chloride (K-DUR) 10 MEQ tablet Take 1 tablet (10 mEq total) by mouth 2 (two) times daily.  30 tablet  6   No current facility-administered medications for this visit.    Allergies  Allergen Reactions  . Vancomycin Cross Reactors Anaphylaxis    Review of Systems complaints related to her morbid obesity and hypertension and history reflux  BP 160/103  Pulse 72  Resp 20  Ht 5' (1.524 m)  Wt 223 lb (101.152 kg)  BMI 43.55 kg/m2  SpO2 95% Physical Exam Alert and oriented, comfortable Lungs clear Heart rate regular without murmur, no carotid bruit Abdomen obese Peripheral pulses intact  Diagnostic Tests: CT of thoracic aorta reviewed patient showing stable fusiform aneurysm measuring 4.3  cm  Impression: Mild ascending thoracic aneurysm-surgery would not be indicated until diameter greater than 5.0 cm. Current risk for dissection at the present aortic size is less than 1% per year if her blood pressure remains controlled  Aortic Size Index=   4.3 cm     /Body surface area is 2.07 meters squared. =2.1  < 2.75 cm/m2      4% risk per year 2.75 to 4.25          8% risk per year > 4.25 cm/m2    20% risk per year  Return in one year for CT scan of aorta

## 2014-04-03 ENCOUNTER — Ambulatory Visit: Payer: Medicaid Other | Admitting: Cardiovascular Disease

## 2014-04-14 ENCOUNTER — Ambulatory Visit: Payer: Medicaid Other | Admitting: Cardiovascular Disease

## 2014-06-09 ENCOUNTER — Ambulatory Visit (INDEPENDENT_AMBULATORY_CARE_PROVIDER_SITE_OTHER): Payer: Medicaid Other | Admitting: Cardiovascular Disease

## 2014-06-09 ENCOUNTER — Encounter: Payer: Self-pay | Admitting: Cardiovascular Disease

## 2014-06-09 VITALS — BP 180/110 | HR 84 | Ht 59.0 in | Wt 235.0 lb

## 2014-06-09 DIAGNOSIS — G459 Transient cerebral ischemic attack, unspecified: Secondary | ICD-10-CM

## 2014-06-09 DIAGNOSIS — F411 Generalized anxiety disorder: Secondary | ICD-10-CM

## 2014-06-09 DIAGNOSIS — I471 Supraventricular tachycardia: Secondary | ICD-10-CM

## 2014-06-09 DIAGNOSIS — R079 Chest pain, unspecified: Secondary | ICD-10-CM

## 2014-06-09 DIAGNOSIS — I712 Thoracic aortic aneurysm, without rupture, unspecified: Secondary | ICD-10-CM

## 2014-06-09 DIAGNOSIS — I10 Essential (primary) hypertension: Secondary | ICD-10-CM

## 2014-06-09 MED ORDER — AMLODIPINE BESYLATE 10 MG PO TABS: 10.0000 mg | ORAL_TABLET | Freq: Every day | ORAL | Status: DC

## 2014-06-09 NOTE — Addendum Note (Signed)
Addended by: Barbarann Ehlers A on: 06/09/2014 11:10 AM   Modules accepted: Orders

## 2014-06-09 NOTE — Progress Notes (Signed)
Patient ID: Sabrina Curry, female   DOB: 1950-12-20, 63 y.o.   MRN: [id number]      SUBJECTIVE: The patient has a history of generalized anxiety disorder, PSVT, essential hypertension, thoracic aortic aneurysm and chest pain. She takes care of her husband who is on dialysis. She has generalized weakness and episodic slurred speech. BP 180/110 today. Occasionally has mild chest tightness. Had one episode of leg swelling relieved with leg elevation and reclining. ECG performed in the office today demonstrates sinus rhythm, heart rate 100 beats per minute with a nonspecific ST segment and T wave abnormality. Still does not have PCP.   Review of Systems: As per "subjective", otherwise negative.  Allergies  Allergen Reactions  . Vancomycin Cross Reactors Anaphylaxis    Current Outpatient Prescriptions  Medication Sig Dispense Refill  . amLODipine (NORVASC) 5 MG tablet Take 1 tablet (5 mg total) by mouth daily.  30 tablet  6  . chlorthalidone (HYGROTON) 25 MG tablet Take 1 tablet (25 mg total) by mouth daily.  30 tablet  6  . diltiazem (CARDIZEM) 120 MG tablet Take 1 tablet (120 mg total) by mouth daily.  30 tablet  5  . isosorbide mononitrate (IMDUR) 30 MG 24 hr tablet Take 30 mg by mouth daily.      [name] Kitchen lisinopril (PRINIVIL,ZESTRIL) 40 MG tablet Take 1 tablet (40 mg total) by mouth daily.  90 tablet  6  . metoprolol (LOPRESSOR) 100 MG tablet Take 1 tablet (100 mg total) by mouth 2 (two) times daily.  60 tablet  3  . potassium chloride (K-DUR) 10 MEQ tablet Take 1 tablet (10 mEq total) by mouth 2 (two) times daily.  30 tablet  6   No current facility-administered medications for this visit.    Past Medical History  Diagnosis Date  . Asthma   . Anxiety   . GERD (gastroesophageal reflux disease)   . Headache(784.0)   . Arthritis   . Chronic back pain   . Hypertension   . Obstructive sleep apnea     On CPAP by nasal prong  . Thoracic aortic aneurysm 12/2012     12/2012: 4.3 cm fusiform  aneurysm-ascending thoracic aorta  . Palpitations 12/2012    ? SVT  . H/O dizziness   . Seasonal allergies   . Hx of migraines   . Knee pain, bilateral   . H/O shortness of breath     Past Surgical History  Procedure Laterality Date  . Abdominal hysterectomy    . Cholecystectomy      [facility]  . Back surgery      had staff infection of back  . Umbilical hernia repair    . Hernia repair  2009    inscional hernia repair x2-3  . Appendectomy      with gallbladder removal  . Cataract extraction w/phaco  01/16/2012    Procedure: CATARACT EXTRACTION PHACO AND INTRAOCULAR LENS PLACEMENT (IOC);  Surgeon: Elta Guadeloupe T. Gershon Crane, MD;  Location: [facility];  Service: Ophthalmology;  Laterality: Right;  CDE 6.12    History   Social History  . Marital Status: Married    Spouse Name: N/A    Number of Children: N/A  . Years of Education: N/A   Occupational History  . CNA-retired     [employer]   Social History Main Topics  . Smoking status: Never Smoker   . Smokeless tobacco: Never Used  . Alcohol Use: No  . Drug Use: No  . Sexual Activity: Yes  Birth Control/ Protection: Post-menopausal, Surgical   Other Topics Concern  . Not on file   Social History Narrative  . No narrative on file     Filed Vitals:   06/09/14 0940  BP: 180/110  Pulse: 84  Height: 4' 11"  (1.499 m)  Weight: 235 lb (106.595 kg)    PHYSICAL EXAM General: NAD HEENT: Normal. Neck: No JVD, no thyromegaly. Lungs: Clear to auscultation bilaterally with normal respiratory effort. CV: Nondisplaced PMI.  Regular rate and rhythm, normal S1/S2, no S3/S4, no murmur. No pretibial or periankle edema.  No carotid bruit.  Normal pedal pulses.  Abdomen: Soft, nontender, no hepatosplenomegaly, no distention.  Neurologic: Alert and oriented x 3. Strength mildly diminished in all extremities, no focal deficits. Psych: Flat affect. Skin: Normal. Musculoskeletal: Normal range of motion, no gross  deformities. Extremities: No clubbing or cyanosis.   ECG: Most recent ECG reviewed.      ASSESSMENT AND PLAN: 1. Chest pain: Symptoms appear to be stable. Normal dobutamine stress echocardiogram on 07-10-2012.  2. Essential hypertension: Markedly elevated. Increase amlodipine to 10 mg daily and have pt return for BP check in two weeks. 3. PSVT: Symptoms appear to be reasonably well controlled on diltiazem 120 mg daily and metoprolol 100 mg twice daily.  4. Thoracic aortic aneurysm: Followed by CT surgery. Remains mild when most recently evaluated at 4.3 cm.  5. TIA: Slurred speech and generalized weakness concerning for possible TIA given markedly elevated BP. Start ASA 81 mg daily and increase amlodipine. Will make a neurology referral as well.  Dispo: f/u 6 months.    Kate Sable, M.D., F.A.C.C.

## 2014-06-09 NOTE — Patient Instructions (Addendum)
Your physician wants you to follow-up in: 6 months You will receive a reminder letter in the mail two months in advance. If you don't receive a letter, please call our office to schedule the follow-up appointment.    Your physician has recommended you make the following change in your medication:     START Aspirin 81 mg daily  INCREASE Norvasc 10 mg daily  You have been referred to Neurology, Dr.[place]ah.Expect a call from his office  [phone]    Please return in 2 weeks for BP check   Thank you for choosing [facility] !

## 2014-06-22 ENCOUNTER — Encounter: Payer: Self-pay | Admitting: *Deleted

## 2014-08-04 IMAGING — CR DG CHEST 1V PORT
1 series · 1 of 1 positions shown · non-contrast
Comparison: 07/10/2012

CLINICAL DATA: Short of breath.  Chest pain and weakness

PORTABLE CHEST - 1 VIEW

[view not recorded]
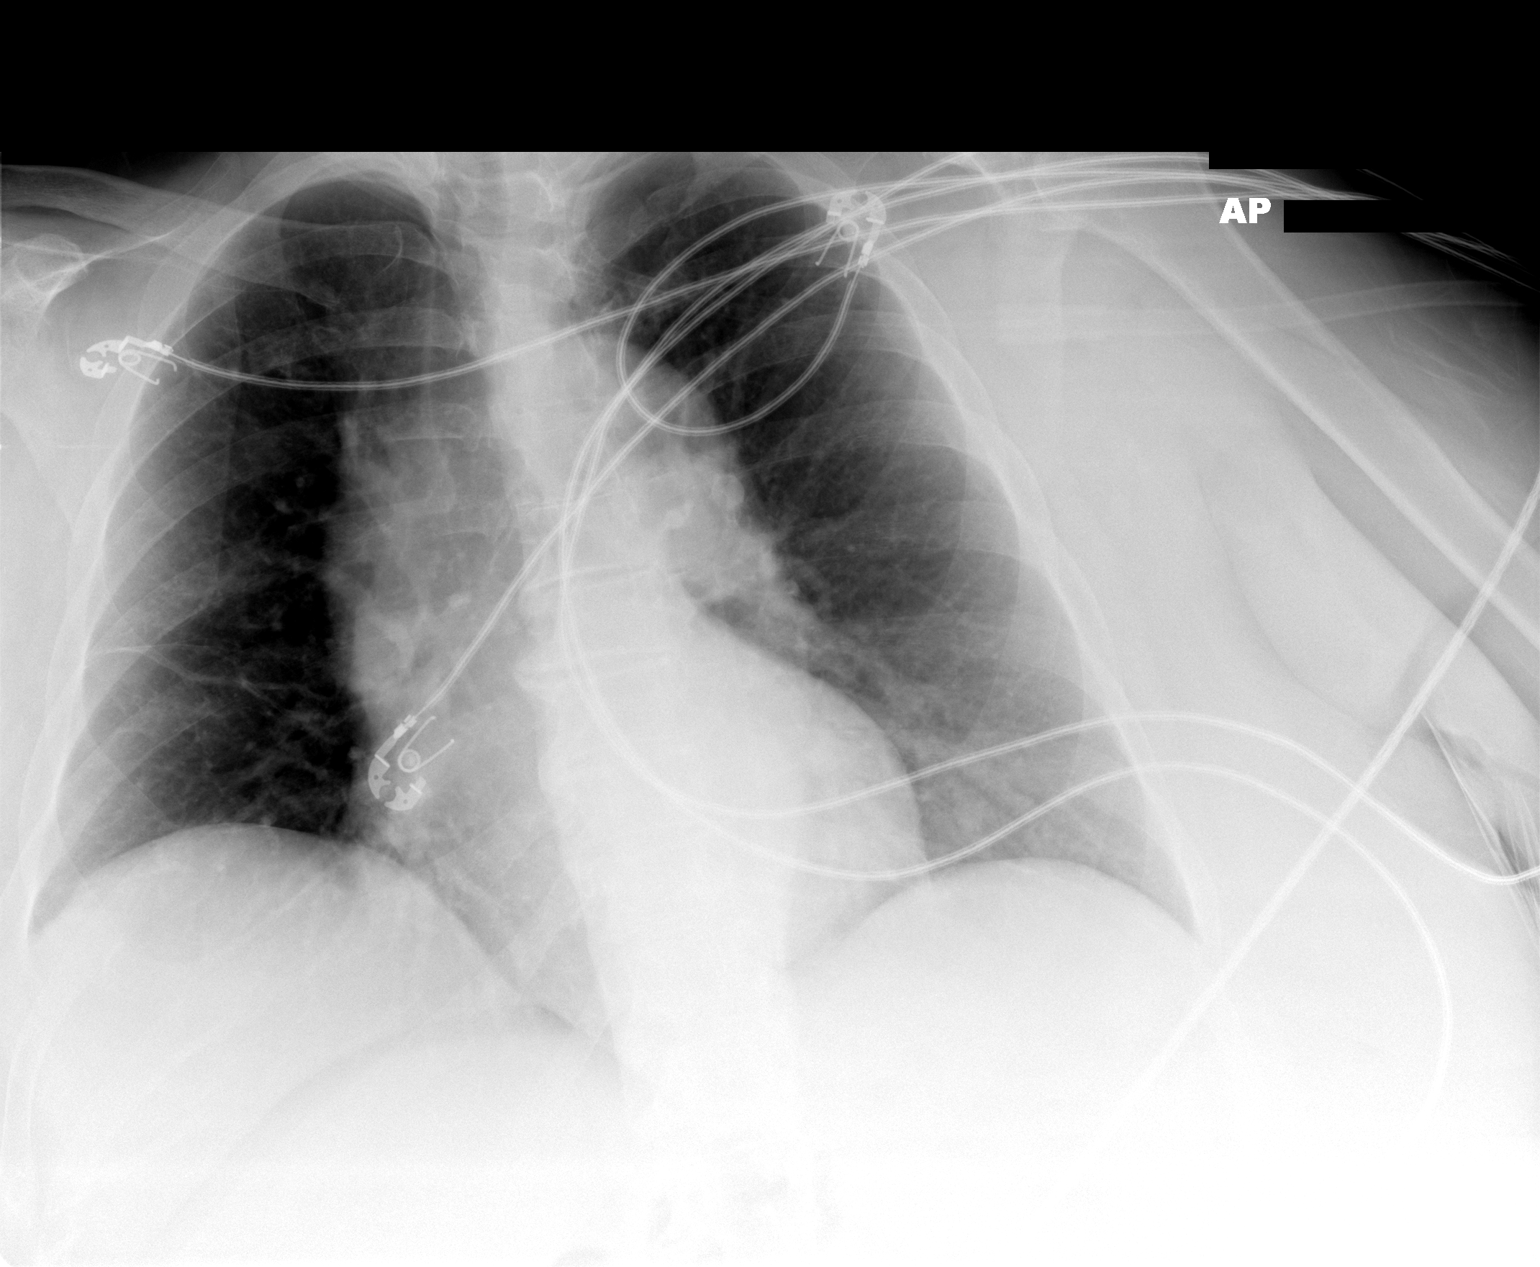

[1 of 1 positions shown; findings below may reference images not displayed]

FINDINGS: Mild scarring right lung base is unchanged.  Negative for
pneumonia.  Negative for heart failure or effusion.
IMPRESSION: No acute cardiopulmonary abnormality.

## 2014-08-06 IMAGING — CR DG CHEST 2V
2 series · 2 of 2 positions shown · non-contrast
Comparison: 12/07/2012

CLINICAL DATA: Shortness of breath

CHEST - 2 VIEW

[view not recorded (1 of 2)]
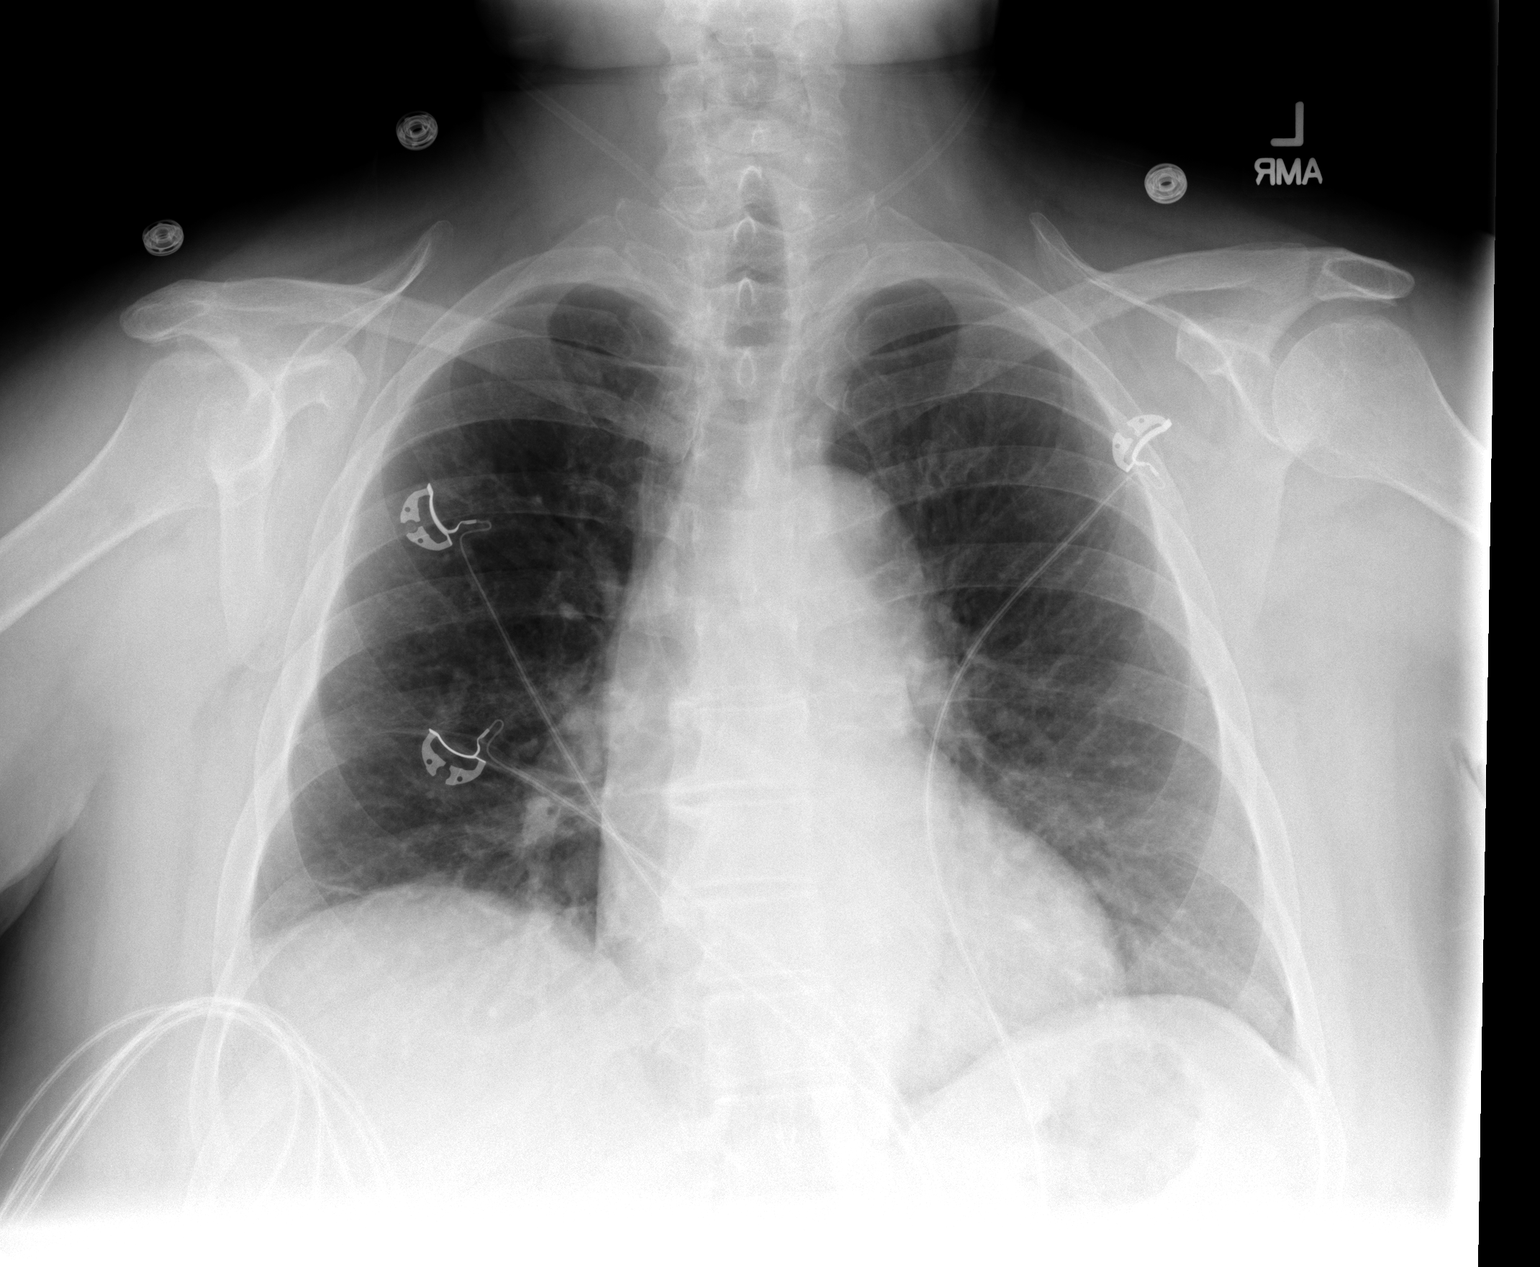

[view not recorded (2 of 2)]
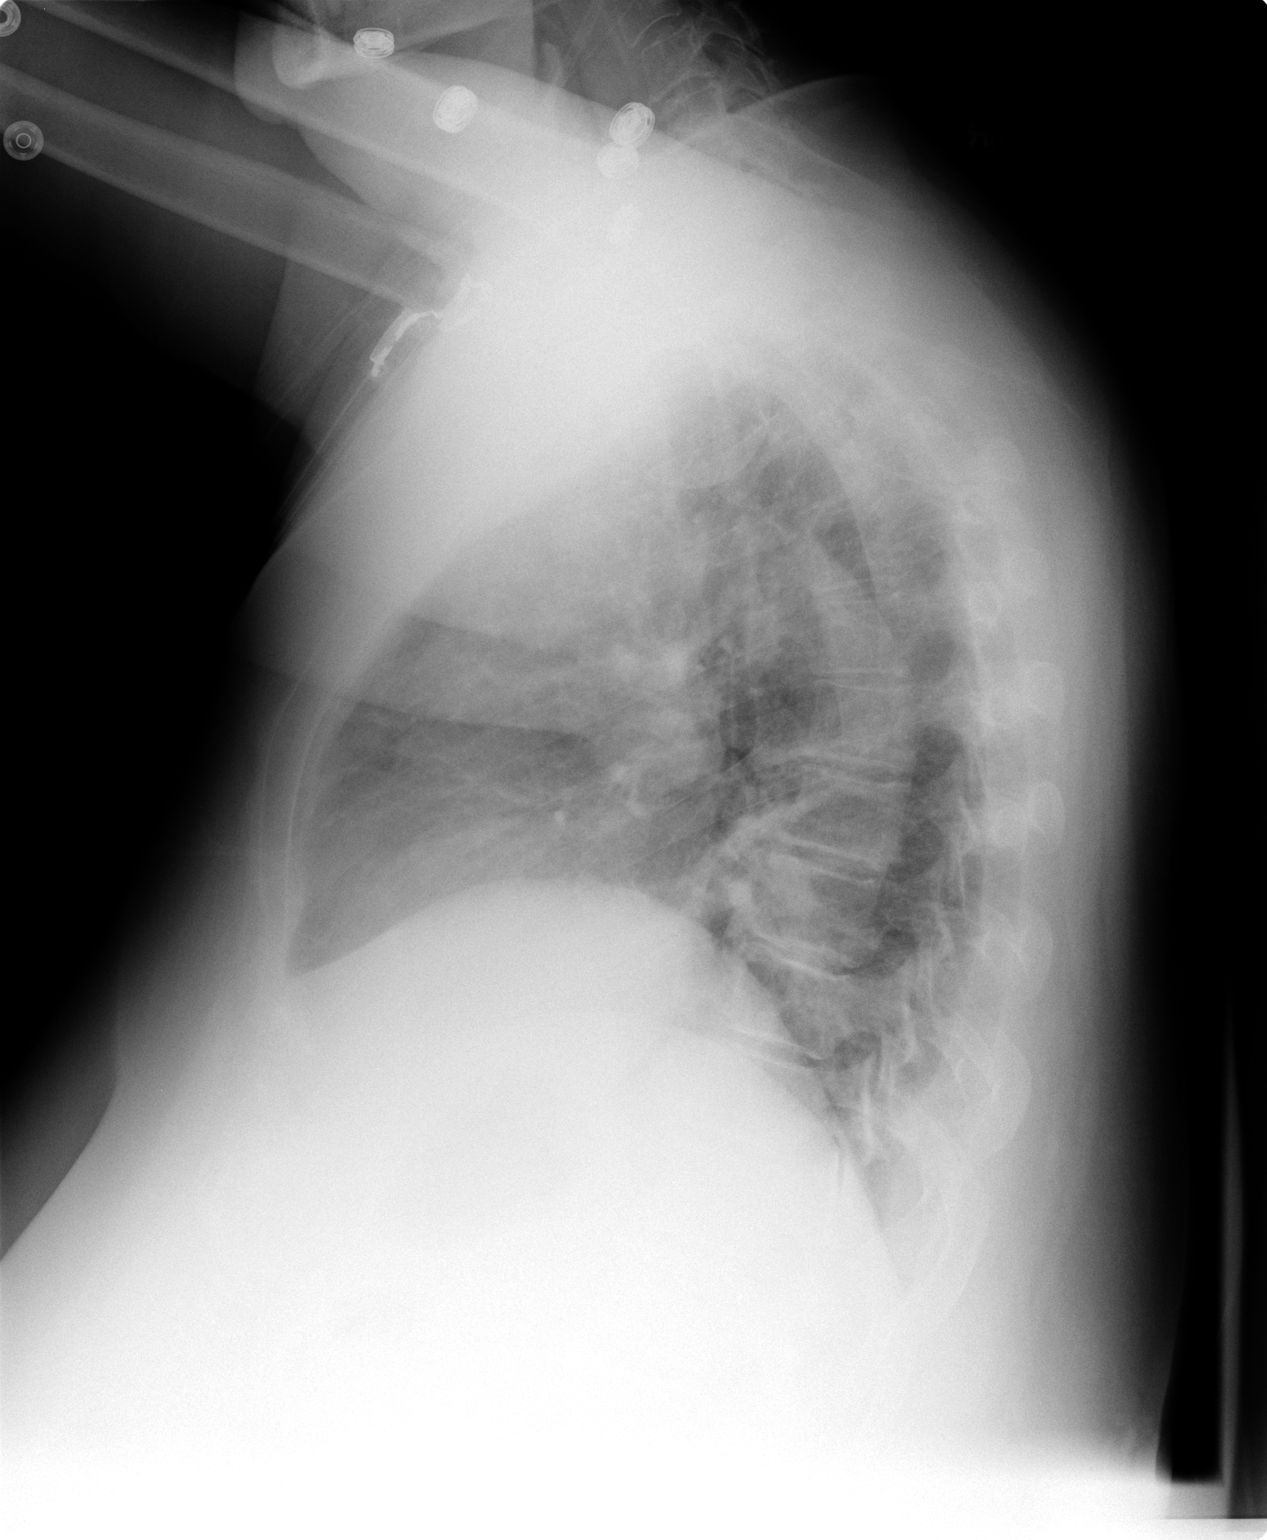

[2 of 2 positions shown; findings below may reference images not displayed]

FINDINGS: Heart and pulmonary vascularity are within normal limits.
The lungs are clear bilaterally.  No acute bony abnormality is
seen.
IMPRESSION: No acute abnormality noted.

## 2014-08-06 IMAGING — US US EXTREM LOW VENOUS BILAT
1 series · 14 of 24 positions shown · non-contrast
Comparison: No comparison studies available.

CLINICAL DATA: The bilateral lower extremity edema

VENOUS DUPLEX ULTRASOUND OF BILATERAL LOWER EXTREMITIES
TECHNIQUE: Gray-scale sonography with graded compression, as well
as color Doppler and duplex ultrasound, were performed to evaluate
the deep venous system of both lower extremities from the level of
the common femoral vein through the popliteal and proximal calf
veins. Spectral Doppler was utilized to evaulate flow at rest and
with distal augmentation maneuvers.

[Series 1: us extrem low venous bilat · 0.09mm/px · 14 of 53 slices shown]
[im 1/53]
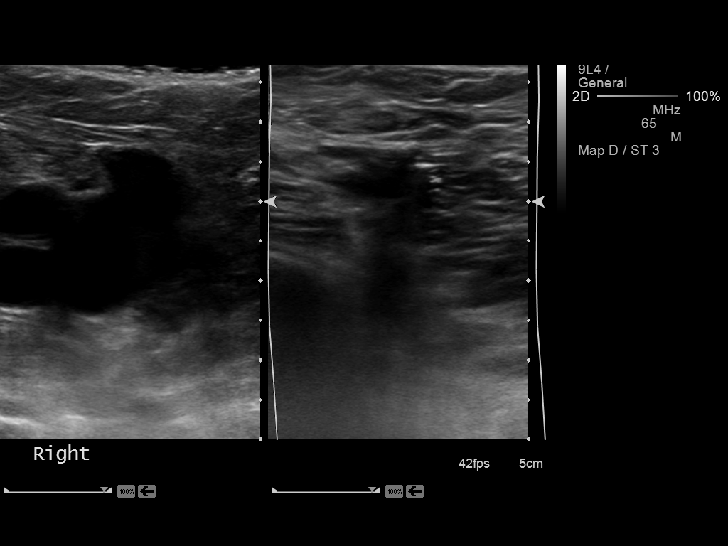
[im 5/53]
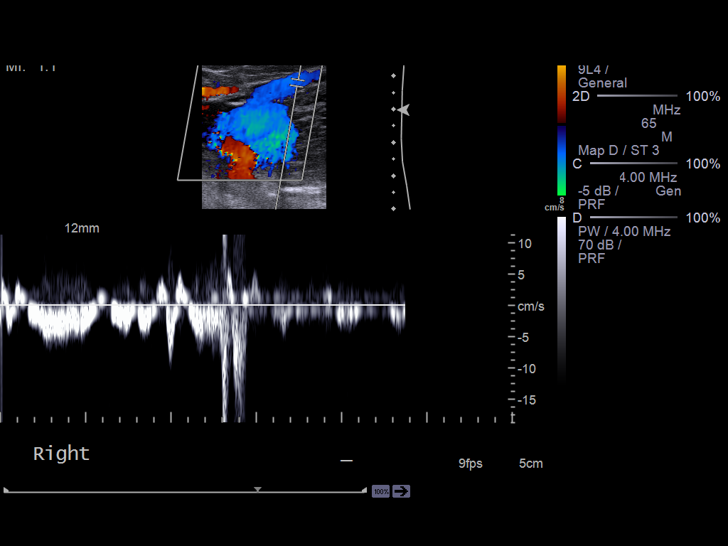
[im 10/53]
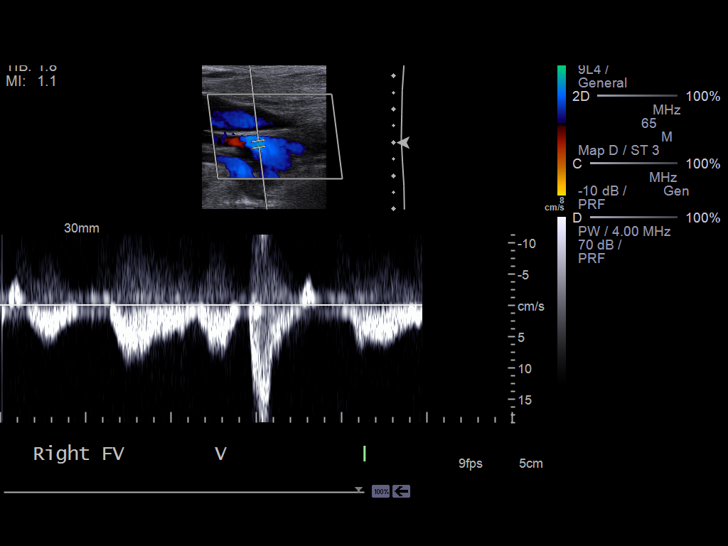
[im 14/53]
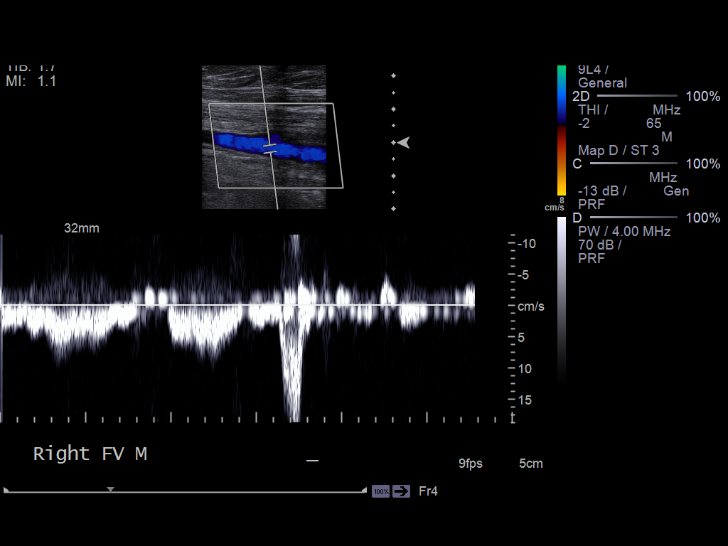
[im 16/53]
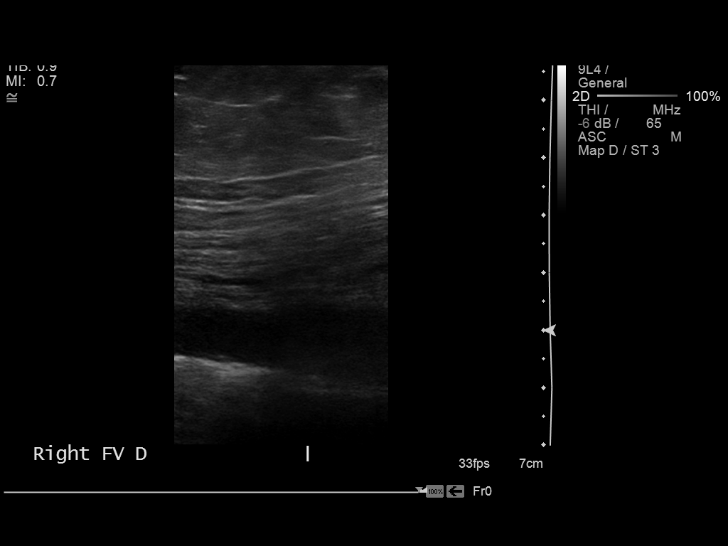
[im 21/53]
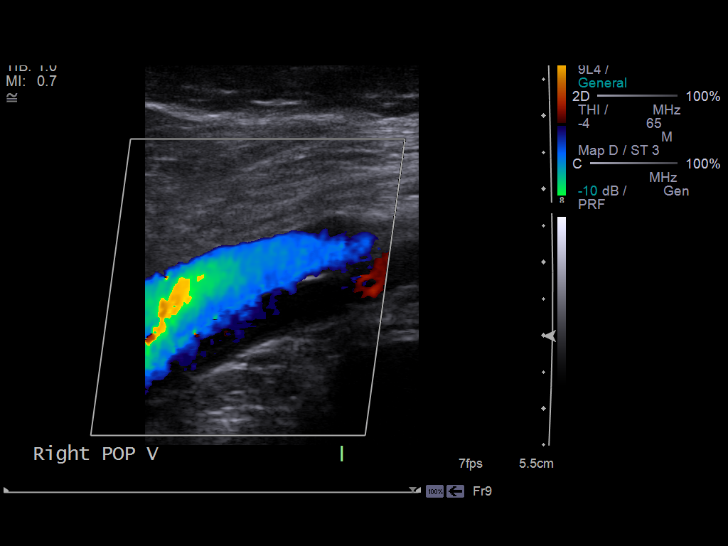
[im 25/53]
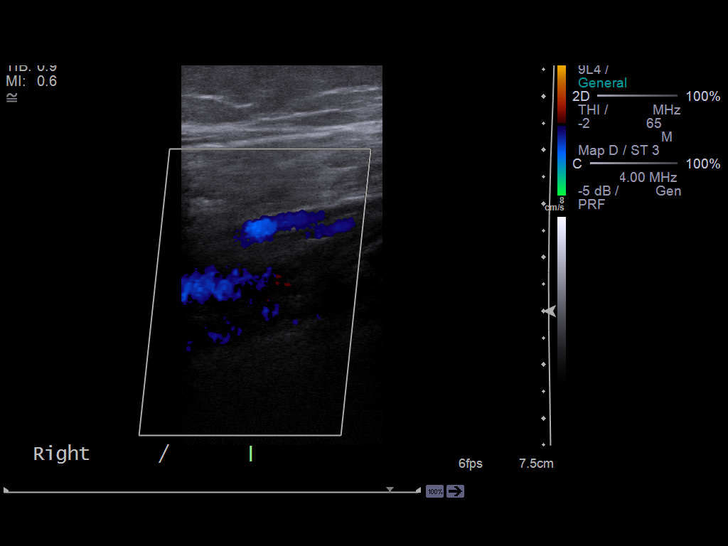
[im 28/53]
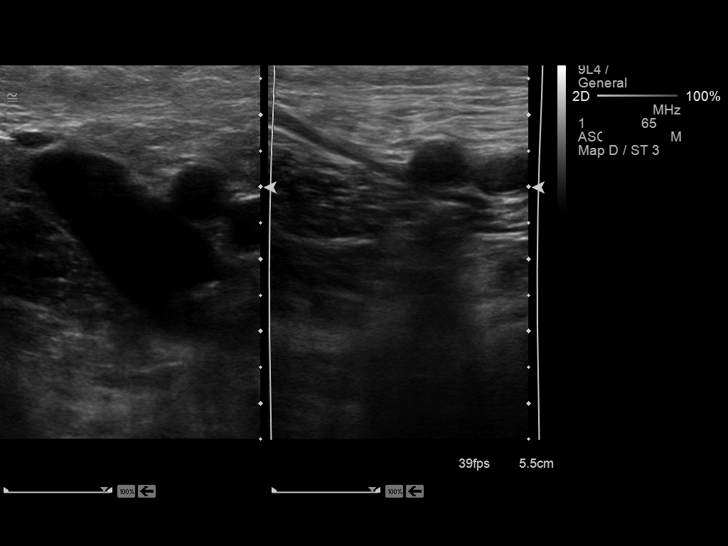
[im 32/53]
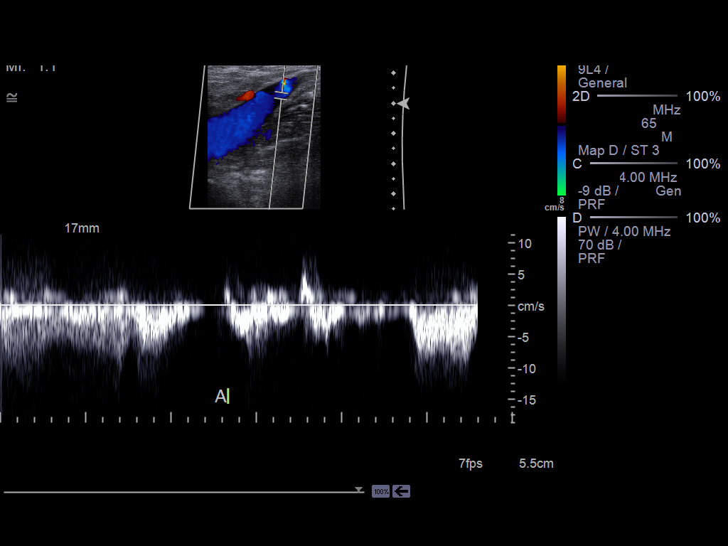
[im 37/53]
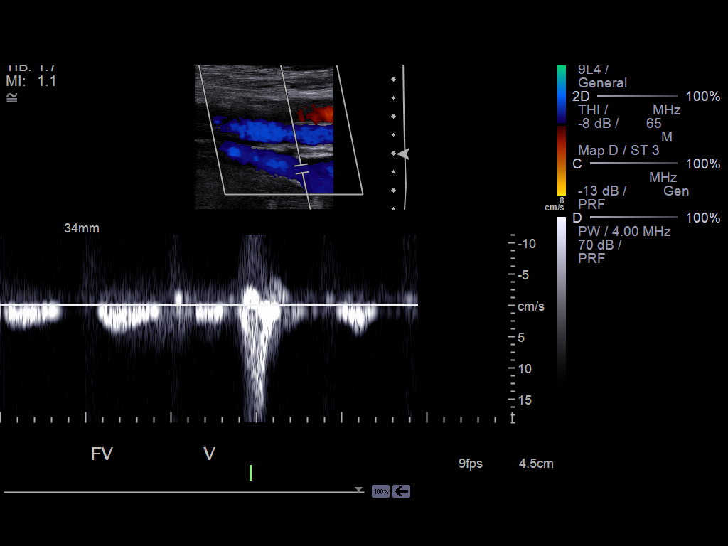
[im 41/53]
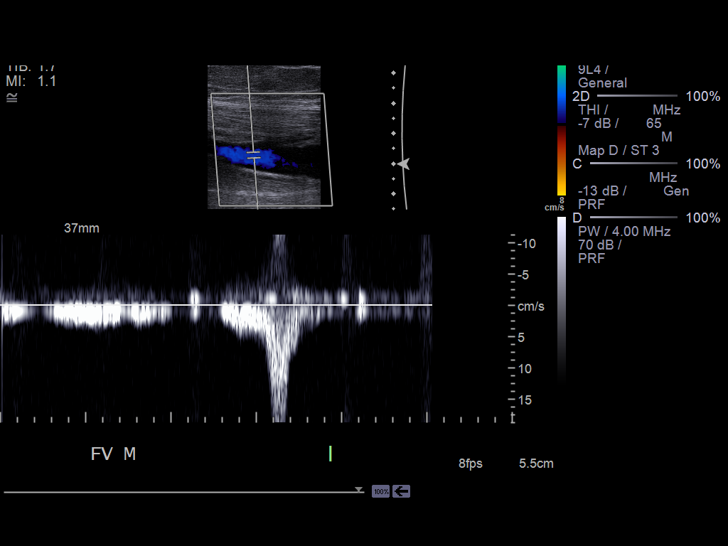
[im 43/53]
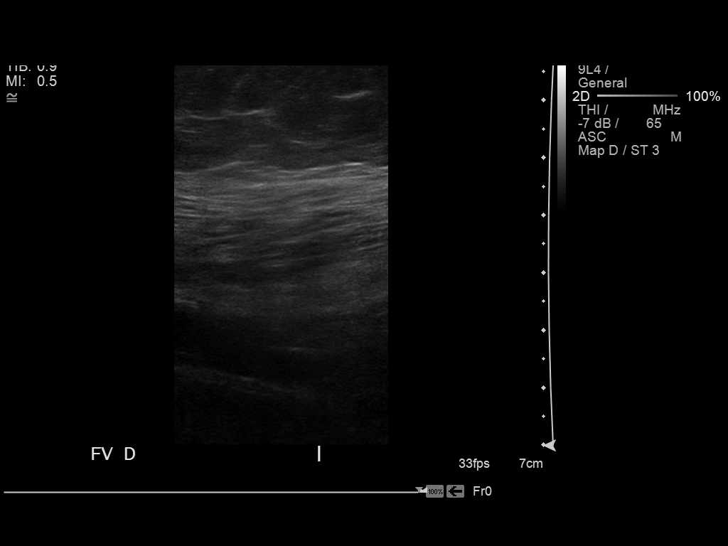
[im 48/53]
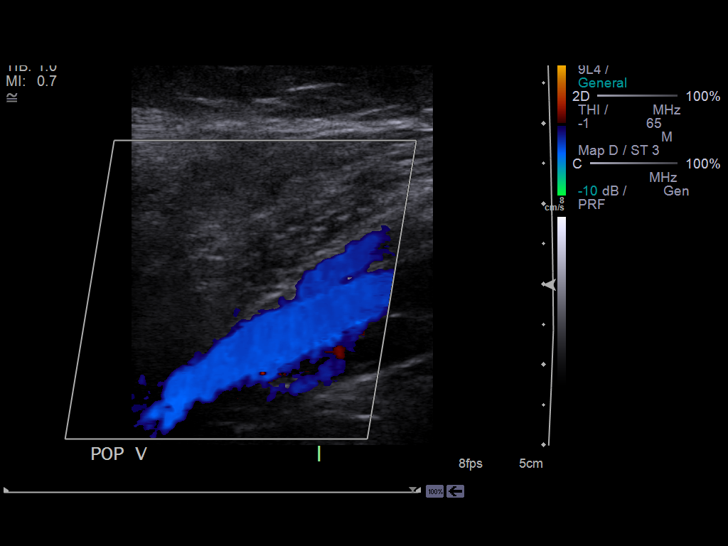
[im 53/53]
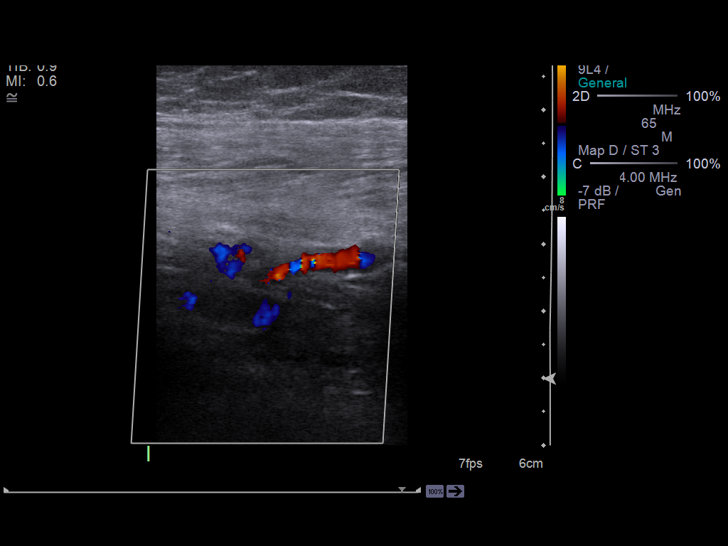

[14 of 24 positions shown; findings below may reference images not displayed]

FINDINGS: The right common femoral vein, superficial femoral vein,
deep femoral vein, saphenofemoral junction, and popliteal vein show
normal patent directional flow, normal augmentation, and normal
compression. Posterior tibial peroneal veins appear patent where
visualized below the knee.

On the left, there is normal flow signal, normal compressibility,
and normal augmentation in the common femoral vein, superficial
femoral vein, profunda vein, saphenofemoral junction, and popliteal
vein.  The posterior tibial and peroneal veins appear patent where
visualized below the knee.
IMPRESSION: No evidence for DVT in either lower extremity.

## 2014-11-16 ENCOUNTER — Other Ambulatory Visit: Payer: Self-pay | Admitting: Cardiovascular Disease

## 2014-11-16 DIAGNOSIS — R21 Rash and other nonspecific skin eruption: Secondary | ICD-10-CM

## 2014-11-16 DIAGNOSIS — I10 Essential (primary) hypertension: Secondary | ICD-10-CM

## 2014-11-16 MED ORDER — LISINOPRIL 40 MG PO TABS: 40.0000 mg | ORAL_TABLET | Freq: Every day | ORAL | Status: DC

## 2014-11-16 NOTE — Telephone Encounter (Signed)
Received fax refill request  Rx # [id number] Medication:  Lisinopril 40 mg tablets Qty 30 Sig:  Take one tablet by mouth once a day Physician:  Bronson Ing

## 2014-11-16 NOTE — Telephone Encounter (Signed)
escribed rx

## 2014-12-22 ENCOUNTER — Ambulatory Visit (INDEPENDENT_AMBULATORY_CARE_PROVIDER_SITE_OTHER): Payer: Medicaid Other | Admitting: Cardiovascular Disease

## 2014-12-22 ENCOUNTER — Encounter: Payer: Self-pay | Admitting: Cardiovascular Disease

## 2014-12-22 VITALS — BP 138/98 | HR 104 | Ht 60.0 in | Wt 248.0 lb

## 2014-12-22 DIAGNOSIS — I712 Thoracic aortic aneurysm, without rupture, unspecified: Secondary | ICD-10-CM

## 2014-12-22 DIAGNOSIS — I471 Supraventricular tachycardia: Secondary | ICD-10-CM

## 2014-12-22 DIAGNOSIS — F411 Generalized anxiety disorder: Secondary | ICD-10-CM

## 2014-12-22 DIAGNOSIS — I10 Essential (primary) hypertension: Secondary | ICD-10-CM | POA: Diagnosis not present

## 2014-12-22 DIAGNOSIS — R079 Chest pain, unspecified: Secondary | ICD-10-CM

## 2014-12-22 DIAGNOSIS — M7989 Other specified soft tissue disorders: Secondary | ICD-10-CM

## 2014-12-22 DIAGNOSIS — G459 Transient cerebral ischemic attack, unspecified: Secondary | ICD-10-CM

## 2014-12-22 MED ORDER — METOPROLOL TARTRATE 100 MG PO TABS: 100.0000 mg | ORAL_TABLET | Freq: Two times a day (BID) | ORAL | Status: DC

## 2014-12-22 MED ORDER — CHLORTHALIDONE 25 MG PO TABS: 25.0000 mg | ORAL_TABLET | Freq: Every day | ORAL | Status: DC

## 2014-12-22 MED ORDER — POTASSIUM CHLORIDE ER 10 MEQ PO TBCR: 10.0000 meq | EXTENDED_RELEASE_TABLET | Freq: Two times a day (BID) | ORAL | Status: DC

## 2014-12-22 MED ORDER — ISOSORBIDE MONONITRATE ER 30 MG PO TB24: 30.0000 mg | ORAL_TABLET | Freq: Every day | ORAL | Status: DC

## 2014-12-22 MED ORDER — FUROSEMIDE 20 MG PO TABS: ORAL_TABLET | ORAL | Status: DC

## 2014-12-22 NOTE — Patient Instructions (Signed)
Your physician wants you to follow-up in: 1 year with Jory Sims NP You will receive a reminder letter in the mail two months in advance. If you don't receive a letter, please call our office to schedule the follow-up appointment.   Take Lasix 20 mg one per day as needed for leg swelling. No refills.You need to get a primary care doctor. I have given you a list of all the local doctors.    Thank you for choosing [facility] !

## 2014-12-22 NOTE — Progress Notes (Signed)
Patient ID: Sabrina Curry, female   DOB: 10-Aug-1951, 64 y.o.   MRN: [id number]      SUBJECTIVE: The patient has a history of generalized anxiety disorder, PSVT, essential hypertension, thoracic aortic aneurysm and chest pain. She takes care of her husband who is on dialysis. At her last visit in September 2015, she described symptoms which were concerning for a TIA. I started aspirin 81 mg and increased her amlodipine and she was markedly hypertensive. I also made a neurology referral but they would not see her because she does not have a PCP, in spite of the fact that it has been recommended several times for her to obtain one. While she does not work, she receives Fish farm manager and disability.  She seldom has chest pain and palpitations. She ran out of her metoprolol two days ago. She said her speech remains slurred at times. She also complains of leg swelling.  Review of Systems: As per "subjective", otherwise negative.  Allergies  Allergen Reactions  . Vancomycin Cross Reactors Anaphylaxis    Current Outpatient Prescriptions  Medication Sig Dispense Refill  . amLODipine (NORVASC) 10 MG tablet Take 1 tablet (10 mg total) by mouth daily. 30 tablet 6  . aspirin 81 MG tablet Take 81 mg by mouth daily.    . chlorthalidone (HYGROTON) 25 MG tablet Take 1 tablet (25 mg total) by mouth daily. 90 tablet 3  . diltiazem (CARDIZEM) 120 MG tablet Take 1 tablet (120 mg total) by mouth daily. 30 tablet 5  . isosorbide mononitrate (IMDUR) 30 MG 24 hr tablet Take 1 tablet (30 mg total) by mouth daily. 90 tablet 3  . lisinopril (PRINIVIL,ZESTRIL) 40 MG tablet Take 1 tablet (40 mg total) by mouth daily. 90 tablet 3  . metoprolol (LOPRESSOR) 100 MG tablet Take 1 tablet (100 mg total) by mouth 2 (two) times daily. 60 tablet 3  . potassium chloride (K-DUR) 10 MEQ tablet Take 1 tablet (10 mEq total) by mouth 2 (two) times daily. 180 tablet 3   No current facility-administered medications for this visit.     Past Medical History  Diagnosis Date  . Asthma   . Anxiety   . GERD (gastroesophageal reflux disease)   . Headache(784.0)   . Arthritis   . Chronic back pain   . Hypertension   . Obstructive sleep apnea     On CPAP by nasal prong  . Thoracic aortic aneurysm 12/2012     12/2012: 4.3 cm fusiform aneurysm-ascending thoracic aorta  . Palpitations 12/2012    ? SVT  . H/O dizziness   . Seasonal allergies   . Hx of migraines   . Knee pain, bilateral   . H/O shortness of breath     Past Surgical History  Procedure Laterality Date  . Abdominal hysterectomy    . Cholecystectomy      [facility]  . Back surgery      had staff infection of back  . Umbilical hernia repair    . Hernia repair  2009    inscional hernia repair x2-3  . Appendectomy      with gallbladder removal  . Cataract extraction w/phaco  01/16/2012    Procedure: CATARACT EXTRACTION PHACO AND INTRAOCULAR LENS PLACEMENT (IOC);  Surgeon: Elta Guadeloupe T. Gershon Crane, MD;  Location: [facility];  Service: Ophthalmology;  Laterality: Right;  CDE 6.12    History   Social History  . Marital Status: Married    Spouse Name: N/A  . Number of Children: N/A  .  Years of Education: N/A   Occupational History  . CNA-retired     [employer]   Social History Main Topics  . Smoking status: Never Smoker   . Smokeless tobacco: Never Used  . Alcohol Use: No  . Drug Use: No  . Sexual Activity: Yes    Birth Control/ Protection: Post-menopausal, Surgical   Other Topics Concern  . Not on file   Social History Narrative     Filed Vitals:   12/22/14 1331  BP: 138/98  Pulse: 104  Height: 5' (1.524 m)  Weight: 248 lb (112.492 kg)  SpO2: 97%   HR by auscultation: 80 bpm  PHYSICAL EXAM General: NAD HEENT: Normal. Neck: No JVD, no thyromegaly. Lungs: Clear to auscultation bilaterally with normal respiratory effort. OE:HOZYYQM rate and rhythm, normal S1/S2, no S3/S4, no murmur. No pretibial or periankle edema.   Abdomen:  Soft, morbidly obese.  Neurologic: Alert and oriented x 3.  Psych: Somewhat flat affect. Skin: Normal. Musculoskeletal: No gross deformities. Extremities: No clubbing or cyanosis.   ECG: Most recent ECG reviewed.      ASSESSMENT AND PLAN: 1. Chest pain: Symptoms appear to be stable. Normal dobutamine stress echocardiogram on 07-10-2012.   2. Essential hypertension: Elevated but has been off metoprolol for past two days, and today's reading is much better than it had been previously. Continue amlodipine 10 mg, chlorthalidone 25 mg, lisinopril 40 mg, and metoprolol 100 mg twice daily.  3. PSVT: Symptoms appear to be reasonably well controlled on diltiazem 120 mg daily and metoprolol 100 mg twice daily.   4. Thoracic aortic aneurysm: Followed by CT surgery. Remains mild when most recently evaluated at 4.3 cm in 01/2014.   5. TIA: Continue ASA. Aggressive BP control warranted.  6. Leg swelling: No pitting edema noted today. Likely related to venous stasis from morbid obesity. Will given ten tablets of Lasix 20 mg although I would not continue this. She needs weight loss.  Dispo: I encouraged her to obtain a PCP, but she says she has no time. Follow up in 1 year.   Kate Sable, M.D., F.A.C.C.

## 2015-01-05 ENCOUNTER — Other Ambulatory Visit: Payer: Self-pay | Admitting: *Deleted

## 2015-01-05 DIAGNOSIS — I712 Thoracic aortic aneurysm, without rupture, unspecified: Secondary | ICD-10-CM

## 2015-01-06 ENCOUNTER — Other Ambulatory Visit: Payer: Self-pay | Admitting: *Deleted

## 2015-01-06 DIAGNOSIS — I712 Thoracic aortic aneurysm, without rupture, unspecified: Secondary | ICD-10-CM

## 2015-01-27 ENCOUNTER — Other Ambulatory Visit: Payer: Medicaid Other

## 2015-01-27 ENCOUNTER — Encounter: Payer: Medicaid Other | Admitting: Cardiothoracic Surgery

## 2015-03-12 ENCOUNTER — Other Ambulatory Visit: Payer: Self-pay | Admitting: *Deleted

## 2015-03-12 DIAGNOSIS — I712 Thoracic aortic aneurysm, without rupture, unspecified: Secondary | ICD-10-CM

## 2015-03-12 LAB — CREATININE, SERUM: Creat: 1.11 mg/dL — ABNORMAL HIGH (ref 0.50–1.10)

## 2015-03-12 LAB — BUN: BUN: 16 mg/dL (ref 6–23)

## 2015-03-17 ENCOUNTER — Encounter: Payer: Self-pay | Admitting: Cardiothoracic Surgery

## 2015-03-17 ENCOUNTER — Ambulatory Visit
Admission: RE | Admit: 2015-03-17 | Discharge: 2015-03-17 | Disposition: A | Discharge status: Home / Self Care | Payer: Medicaid Other | Source: Ambulatory Visit | Attending: Cardiothoracic Surgery | Admitting: Cardiothoracic Surgery

## 2015-03-17 ENCOUNTER — Encounter: Payer: Medicaid Other | Admitting: Cardiothoracic Surgery

## 2015-03-17 ENCOUNTER — Ambulatory Visit (INDEPENDENT_AMBULATORY_CARE_PROVIDER_SITE_OTHER): Payer: Medicaid Other | Admitting: Cardiothoracic Surgery

## 2015-03-17 VITALS — BP 194/110 | HR 77 | Resp 20 | Ht 60.0 in | Wt 245.0 lb

## 2015-03-17 DIAGNOSIS — I712 Thoracic aortic aneurysm, without rupture, unspecified: Secondary | ICD-10-CM

## 2015-03-17 DIAGNOSIS — I7121 Aneurysm of the ascending aorta, without rupture: Secondary | ICD-10-CM

## 2015-03-17 MED ORDER — IOPAMIDOL (ISOVUE-370) INJECTION 76%
75.0000 mL | Freq: Once | INTRAVENOUS | Status: AC | PRN
  Administered 2015-03-17: 75 mL via INTRAVENOUS

## 2015-03-17 NOTE — Progress Notes (Signed)
PCP is Neale Burly, MD Referring Provider is Delfina Redwood, MD  Chief Complaint  Patient presents with  . Follow-up    1 year f/u with CTA Chest  . 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 It was first noted in 2014 after echocardiogram was performed for symptoms of CHF. She has normal LV systolic function and normal aortic valve function. Patient has hypertension and is on multiple medications. Unfortunately she is not established care with a primary care physician and blood pressure control is a problem. She denies chest pain. There's no family history history of aortic dissection.  I have independently reviewed the CT scan of the chest performed prior to this visit showing no change in the diameter of the ascending aorta at 4.3 cm. There is no intramural hematoma or ulceration. The patient   Past Medical History  Diagnosis Date  . Asthma   . Anxiety   . GERD (gastroesophageal reflux disease)   . Headache(784.0)   . Arthritis   . Chronic back pain   . Hypertension   . Obstructive sleep apnea     On CPAP by nasal prong  . Thoracic aortic aneurysm 12/2012     12/2012: 4.3 cm fusiform aneurysm-ascending thoracic aorta  . Palpitations 12/2012    ? SVT  . H/O dizziness   . Seasonal allergies   . Hx of migraines   . Knee pain, bilateral   . H/O shortness of breath     Past Surgical History  Procedure Laterality Date  . Abdominal hysterectomy    . Cholecystectomy      [facility]  . Back surgery      had staff infection of back  . Umbilical hernia repair    . Hernia repair  2009    inscional hernia repair x2-3  . Appendectomy      with gallbladder removal  . Cataract extraction w/phaco  01/16/2012    Procedure: CATARACT EXTRACTION PHACO AND INTRAOCULAR LENS PLACEMENT (IOC);  Surgeon: Elta Guadeloupe T. Gershon Crane, MD;  Location: [facility];  Service: Ophthalmology;  Laterality: Right;   CDE 6.12    Family History  Problem Relation Age of Onset  . Anesthesia problems Neg Hx   . Hypotension Neg Hx   . Pseudochol deficiency Neg Hx   . Malignant hyperthermia Neg Hx   . Hypertension Mother   . Diabetes Mother   . Hyperlipidemia Father     Also hypertension and an aneurysm    Social History History  Substance Use Topics  . Smoking status: Never Smoker   . Smokeless tobacco: Never Used  . Alcohol Use: No    Current Outpatient Prescriptions  Medication Sig Dispense Refill  . amLODipine (NORVASC) 10 MG tablet Take 1 tablet (10 mg total) by mouth daily. 30 tablet 6  . aspirin 81 MG tablet Take 81 mg by mouth daily.    . chlorthalidone (HYGROTON) 25 MG tablet Take 1 tablet (25 mg total) by mouth daily. 90 tablet 3  . isosorbide mononitrate (IMDUR) 30 MG 24 hr tablet Take 1 tablet (30 mg total) by mouth daily. 90 tablet 3  . lisinopril (PRINIVIL,ZESTRIL) 40 MG tablet Take 1 tablet (40 mg total) by mouth daily. 90 tablet 3  . metoprolol (LOPRESSOR) 100 MG tablet Take 1 tablet (100 mg total) by mouth 2 (two) times daily. 60 tablet 3  . potassium chloride (K-DUR) 10 MEQ tablet Take  1 tablet (10 mEq total) by mouth 2 (two) times daily. 180 tablet 3   No current facility-administered medications for this visit.    Allergies  Allergen Reactions  . Vancomycin Cross Reactors Anaphylaxis    Review of Systems   The patient has had issues with dizziness and some slurred speech intermittently. Her physicians have tried to get her to see neurology but because she does not have a primary care physician this has not occurred.  She denies chest pain. She does complain of ankle swelling. She states she used to take Lasix but that was stopped by her prior primary care physician.   She denies weight loss or fever  She denies GI pain or jaundice. She denies hematuria She denies diabetes  BP 194/110 mmHg  Pulse 77  Resp 20  Ht 5' (1.524 m)  Wt 245 lb (111.131 kg)  BMI 47.85  kg/m2  SpO2 96% Physical Exam     Physical Exam  General: or bruit obese Caucasian female anxious HEENT: Normocephalic pupils equal , dentition with full dental plates Neck: Supple without JVD, adenopathy, or bruit Chest: Clear to auscultation, symmetrical breath sounds, no rhonchi, no tenderness             or deformity Cardiovascular: Regular rate and rhythm, no murmur, no gallop, peripheral pulses             palpable in all extremities Abdomen:  Soft, nontender, no palpable mass or organomegaly Extremities: Warm, well-perfused, no clubbing cyanosis,   mild ankle edema but without  tenderness,              no venous stasis changes of the legs Rectal/GU: Deferred Neuro: Grossly non--focal and symmetrical throughout Skin: Clean and dry without rash or ulceration   Diagnostic Tests: CT scan and chest independently reviewed showing no change in her fusiform ascending aneurysm 4.3 cm. Annual risk of dissection would be approximately one percent. Surgery is recommended when diameter exceeds 5.5 cm.  Impression:mild-moderate fusiform ascending thoracic aneurysm 4.3 cm.   Plan:continue with annual surveillance scans.return in one year. We have provided the patient with a phone number to establish care in the community family [facility] clinic [facility]. This will be important for her to obtain adequate care for her hypertension and this will be important for preventing thoracic aortic disease to progress.   Len Childs, MD [facility] [phone]

## 2015-04-19 ENCOUNTER — Other Ambulatory Visit: Payer: Self-pay | Admitting: Cardiovascular Disease

## 2015-06-21 ENCOUNTER — Other Ambulatory Visit: Payer: Self-pay | Admitting: Cardiovascular Disease

## 2015-06-23 ENCOUNTER — Emergency Department (HOSPITAL_COMMUNITY): Payer: Medicaid Other

## 2015-06-23 ENCOUNTER — Inpatient Hospital Stay (HOSPITAL_COMMUNITY)
Admission: EM | Admit: 2015-06-23 | Discharge: 2015-06-25 | DRG: 392 | Disposition: A | Discharge status: Home Health | Payer: Medicaid Other | Attending: Internal Medicine | Admitting: Internal Medicine

## 2015-06-23 ENCOUNTER — Encounter (HOSPITAL_COMMUNITY): Payer: Self-pay | Admitting: Emergency Medicine

## 2015-06-23 DIAGNOSIS — R079 Chest pain, unspecified: Secondary | ICD-10-CM

## 2015-06-23 DIAGNOSIS — K219 Gastro-esophageal reflux disease without esophagitis: Principal | ICD-10-CM | POA: Diagnosis present

## 2015-06-23 DIAGNOSIS — I712 Thoracic aortic aneurysm, without rupture, unspecified: Secondary | ICD-10-CM | POA: Diagnosis present

## 2015-06-23 DIAGNOSIS — I10 Essential (primary) hypertension: Secondary | ICD-10-CM | POA: Diagnosis present

## 2015-06-23 DIAGNOSIS — Z6841 Body Mass Index (BMI) 40.0 and over, adult: Secondary | ICD-10-CM

## 2015-06-23 DIAGNOSIS — R479 Unspecified speech disturbances: Secondary | ICD-10-CM | POA: Diagnosis present

## 2015-06-23 DIAGNOSIS — F419 Anxiety disorder, unspecified: Secondary | ICD-10-CM | POA: Diagnosis present

## 2015-06-23 DIAGNOSIS — E669 Obesity, unspecified: Secondary | ICD-10-CM | POA: Diagnosis present

## 2015-06-23 DIAGNOSIS — Z833 Family history of diabetes mellitus: Secondary | ICD-10-CM

## 2015-06-23 DIAGNOSIS — R4781 Slurred speech: Secondary | ICD-10-CM

## 2015-06-23 DIAGNOSIS — N390 Urinary tract infection, site not specified: Secondary | ICD-10-CM | POA: Diagnosis present

## 2015-06-23 DIAGNOSIS — G4733 Obstructive sleep apnea (adult) (pediatric): Secondary | ICD-10-CM | POA: Diagnosis present

## 2015-06-23 DIAGNOSIS — M1990 Unspecified osteoarthritis, unspecified site: Secondary | ICD-10-CM | POA: Diagnosis present

## 2015-06-23 DIAGNOSIS — R0902 Hypoxemia: Secondary | ICD-10-CM | POA: Diagnosis present

## 2015-06-23 DIAGNOSIS — Z8249 Family history of ischemic heart disease and other diseases of the circulatory system: Secondary | ICD-10-CM

## 2015-06-23 DIAGNOSIS — G459 Transient cerebral ischemic attack, unspecified: Secondary | ICD-10-CM

## 2015-06-23 LAB — CBC WITH DIFFERENTIAL/PLATELET
BASOS PCT: 1 %
Basophils Absolute: 0.1 10*3/uL (ref 0.0–0.1)
EOS ABS: 0.2 10*3/uL (ref 0.0–0.7)
EOS PCT: 3 %
HCT: 42.4 % (ref 36.0–46.0)
Hemoglobin: 13.7 g/dL (ref 12.0–15.0)
LYMPHS ABS: 3.5 10*3/uL (ref 0.7–4.0)
Lymphocytes Relative: 42 %
MCH: 31.1 pg (ref 26.0–34.0)
MCHC: 32.3 g/dL (ref 30.0–36.0)
MCV: 96.1 fL (ref 78.0–100.0)
MONO ABS: 0.6 10*3/uL (ref 0.1–1.0)
MONOS PCT: 7 %
NEUTROS PCT: 47 %
Neutro Abs: 4 10*3/uL (ref 1.7–7.7)
PLATELETS: 168 10*3/uL (ref 150–400)
RBC: 4.41 MIL/uL (ref 3.87–5.11)
RDW: 13.1 % (ref 11.5–15.5)
WBC: 8.4 10*3/uL (ref 4.0–10.5)

## 2015-06-23 MED ORDER — ONDANSETRON HCL 4 MG/2ML IJ SOLN
4.0000 mg | Freq: Once | INTRAMUSCULAR | Status: AC
  Administered 2015-06-23: 4 mg via INTRAVENOUS
  Filled 2015-06-23: qty 2

## 2015-06-23 NOTE — ED Notes (Signed)
Daughter states speak is ok now, earlier today could not understand a word she was saying

## 2015-06-23 NOTE — ED Provider Notes (Signed)
CSN: [id number]     Arrival date & time 06/23/15  2119 History  By signing my name below, I, Hansel Feinstein, attest that this documentation has been prepared under the direction and in the presence of [name], MD. Electronically Signed: Hansel Feinstein, ED Scribe. 06/23/2015. 10:29 PM.    Chief Complaint  Patient presents with  . Chest Pain   The history is provided by the patient. No language interpreter was used.    HPI Comments: Sabrina Curry is a 64 y.o. female with hx of HTN, thoracic aortic aneurism who presents to the Emergency Department complaining of moderate, intermittent CP onset months ago with associated bilateral leg swelling, slurred speech, generalized weakness, bilateral leg pain, difficulty ambulating secondary to pain, nausea. Daughter reports that her slurred speech is waxing and waning. She reports that her cardiologist is aware of her slurred speech. Daughter reports that the pt has not discussed her CP with her cardiologist. She denies cough, fever, diarrhea, constipation, HA.   Past Medical History  Diagnosis Date  . Asthma   . Anxiety   . GERD (gastroesophageal reflux disease)   . Headache(784.0)   . Arthritis   . Chronic back pain   . Hypertension   . Obstructive sleep apnea     On CPAP by nasal prong  . Thoracic aortic aneurysm [facility]) 12/2012     12/2012: 4.3 cm fusiform aneurysm-ascending thoracic aorta  . Palpitations 12/2012    ? SVT  . H/O dizziness   . Seasonal allergies   . Hx of migraines   . Knee pain, bilateral   . H/O shortness of breath    Past Surgical History  Procedure Laterality Date  . Abdominal hysterectomy    . Cholecystectomy      [facility]  . Back surgery      had staff infection of back  . Umbilical hernia repair    . Hernia repair  2009    inscional hernia repair x2-3  . Appendectomy      with gallbladder removal  . Cataract extraction w/phaco  01/16/2012    Procedure: CATARACT EXTRACTION PHACO AND INTRAOCULAR LENS  PLACEMENT (IOC);  Surgeon: Elta Guadeloupe T. Gershon Crane, MD;  Location: [facility];  Service: Ophthalmology;  Laterality: Right;  CDE 6.12   Family History  Problem Relation Age of Onset  . Anesthesia problems Neg Hx   . Hypotension Neg Hx   . Pseudochol deficiency Neg Hx   . Malignant hyperthermia Neg Hx   . Hypertension Mother   . Diabetes Mother   . Hyperlipidemia Father     Also hypertension and an aneurysm   Social History  Substance Use Topics  . Smoking status: Never Smoker   . Smokeless tobacco: Never Used  . Alcohol Use: No   OB History    No data available     Review of Systems  Constitutional: Negative for fever.  Respiratory: Negative for cough.   Cardiovascular: Positive for chest pain and leg swelling.  Gastrointestinal: Positive for nausea. Negative for vomiting and constipation.  Musculoskeletal: Positive for arthralgias.  Neurological: Positive for speech difficulty and weakness. Negative for headaches.   Allergies  Vancomycin cross reactors  Home Medications   Prior to Admission medications   Medication Sig Start Date End Date Taking? Authorizing Provider  amLODipine (NORVASC) 10 MG tablet TAKE ONE TABLET BY MOUTH DAILY. 06/21/15   Herminio Commons, MD  aspirin 81 MG tablet Take 81 mg by mouth daily.    Historical Provider, MD  chlorthalidone (HYGROTON) 25 MG tablet Take 1 tablet (25 mg total) by mouth daily. 12/22/14   Herminio Commons, MD  isosorbide mononitrate (IMDUR) 30 MG 24 hr tablet Take 1 tablet (30 mg total) by mouth daily. 12/22/14   Herminio Commons, MD  lisinopril (PRINIVIL,ZESTRIL) 40 MG tablet Take 1 tablet (40 mg total) by mouth daily. 11/16/14   Herminio Commons, MD  metoprolol (LOPRESSOR) 100 MG tablet TAKE ONE TABLET BY MOUTH TWICE DAILY. 04/19/15   Herminio Commons, MD  potassium chloride (K-DUR) 10 MEQ tablet Take 1 tablet (10 mEq total) by mouth 2 (two) times daily. 12/22/14   Herminio Commons, MD   BP 136/97 mmHg  Pulse 81  Temp(Src)  98.6 F (37 C) (Oral)  Resp 18  Ht 4' 10"  (1.473 m)  Wt 257 lb (116.574 kg)  BMI 53.73 kg/m2  SpO2 92% Physical Exam  Constitutional: She is oriented to person, place, and time. She appears well-developed and well-nourished.  HENT:  Head: Normocephalic and atraumatic.  Face symmetric.  Eyes: Conjunctivae and EOM are normal. Pupils are equal, round, and reactive to light.  Neck: Normal range of motion. Neck supple.  Cardiovascular: Normal rate.   Good DP pulses bilaterally.   Pulmonary/Chest: Effort normal. No respiratory distress.  Lungs CTA bilaterally.   Abdominal: She exhibits no distension. There is tenderness.  Diffuse abdominal tenderness; no masses.  Musculoskeletal: Normal range of motion. She exhibits edema.  BLE pitting edema. Moving all extremities.   Neurological: She is alert and oriented to person, place, and time.  Skin: Skin is warm and dry.  Psychiatric: She has a normal mood and affect. Her behavior is normal.  Nursing note and vitals reviewed.   ED Course  Procedures (including critical care time) DIAGNOSTIC STUDIES: Oxygen Saturation is 100% on RA, normal by my interpretation.    COORDINATION OF CARE: 10:24 PM Discussed treatment plan with pt at bedside and pt agreed to plan.   Labs Review Labs Reviewed  CBC WITH DIFFERENTIAL/PLATELET  COMPREHENSIVE METABOLIC PANEL  TROPONIN I  URINALYSIS, ROUTINE W REFLEX MICROSCOPIC (NOT AT [facility])    Imaging Review No results found. I have personally reviewed and evaluated these images and lab results as part of my medical decision-making.   EKG Interpretation   Date/Time:  Wednesday June 23 2015 21:28:23 EDT Ventricular Rate:  81 PR Interval:  192 QRS Duration: 92 QT Interval:  382 QTC Calculation: 443 R Axis:   63 Text Interpretation:  Normal sinus rhythm Normal ECG Confirmed by  Alvino Chapel  MD, Ovid Curd [phone]) on 06/23/2015 10:14:25 PM      MDM   Final diagnoses:  Difficulty speaking  Chest  pain     patient with chest pain. Has been on and off. Also abdominal pain. Has known thoracic aortic aneurysm. With worry some difficulty speaking to get  CT angiography of neck chest abdomen pelvis. Also noncontrast CT of head. EKG reassuring for the chest pain. Will need admission for internal medicine for the episodic  Difficulty speaking also. I, [name] R., personally performed the services described in this documentation. All medical record entries made by the scribe were at my direction and in my presence.  I have reviewed the chart and discharge instructions and agree that the record reflects my personal performance and is accurate and complete. Aitanna Haubner R..  06/23/2015. 11:35 PM.      [name], MD 06/23/15 [phone]

## 2015-06-23 NOTE — ED Notes (Signed)
Onset early today chest pain, nausea, sob, dizziness, states leg has been swelling x 2days

## 2015-06-24 ENCOUNTER — Inpatient Hospital Stay (HOSPITAL_COMMUNITY): Payer: Medicaid Other

## 2015-06-24 DIAGNOSIS — G459 Transient cerebral ischemic attack, unspecified: Secondary | ICD-10-CM | POA: Diagnosis not present

## 2015-06-24 DIAGNOSIS — I712 Thoracic aortic aneurysm, without rupture: Secondary | ICD-10-CM | POA: Diagnosis present

## 2015-06-24 DIAGNOSIS — R4781 Slurred speech: Secondary | ICD-10-CM | POA: Insufficient documentation

## 2015-06-24 DIAGNOSIS — R490 Dysphonia: Secondary | ICD-10-CM

## 2015-06-24 DIAGNOSIS — R079 Chest pain, unspecified: Secondary | ICD-10-CM | POA: Diagnosis present

## 2015-06-24 DIAGNOSIS — K219 Gastro-esophageal reflux disease without esophagitis: Secondary | ICD-10-CM | POA: Diagnosis present

## 2015-06-24 DIAGNOSIS — R479 Unspecified speech disturbances: Secondary | ICD-10-CM | POA: Diagnosis present

## 2015-06-24 DIAGNOSIS — F419 Anxiety disorder, unspecified: Secondary | ICD-10-CM | POA: Diagnosis present

## 2015-06-24 DIAGNOSIS — E669 Obesity, unspecified: Secondary | ICD-10-CM | POA: Diagnosis present

## 2015-06-24 DIAGNOSIS — Z833 Family history of diabetes mellitus: Secondary | ICD-10-CM | POA: Diagnosis not present

## 2015-06-24 DIAGNOSIS — G4733 Obstructive sleep apnea (adult) (pediatric): Secondary | ICD-10-CM | POA: Diagnosis present

## 2015-06-24 DIAGNOSIS — R0902 Hypoxemia: Secondary | ICD-10-CM | POA: Diagnosis present

## 2015-06-24 DIAGNOSIS — N390 Urinary tract infection, site not specified: Secondary | ICD-10-CM | POA: Diagnosis present

## 2015-06-24 DIAGNOSIS — I10 Essential (primary) hypertension: Secondary | ICD-10-CM | POA: Diagnosis present

## 2015-06-24 DIAGNOSIS — Z8249 Family history of ischemic heart disease and other diseases of the circulatory system: Secondary | ICD-10-CM | POA: Diagnosis not present

## 2015-06-24 DIAGNOSIS — M1990 Unspecified osteoarthritis, unspecified site: Secondary | ICD-10-CM | POA: Diagnosis present

## 2015-06-24 DIAGNOSIS — Z6841 Body Mass Index (BMI) 40.0 and over, adult: Secondary | ICD-10-CM | POA: Diagnosis not present

## 2015-06-24 LAB — CBC
HCT: 37.8 % (ref 36.0–46.0)
HEMOGLOBIN: 11.9 g/dL — AB (ref 12.0–15.0)
MCH: 30.1 pg (ref 26.0–34.0)
MCHC: 31.5 g/dL (ref 30.0–36.0)
MCV: 95.7 fL (ref 78.0–100.0)
Platelets: 144 10*3/uL — ABNORMAL LOW (ref 150–400)
RBC: 3.95 MIL/uL (ref 3.87–5.11)
RDW: 13.1 % (ref 11.5–15.5)
WBC: 7.4 10*3/uL (ref 4.0–10.5)

## 2015-06-24 LAB — URINALYSIS, ROUTINE W REFLEX MICROSCOPIC
BILIRUBIN URINE: NEGATIVE
Glucose, UA: NEGATIVE mg/dL
HGB URINE DIPSTICK: NEGATIVE
KETONES UR: NEGATIVE mg/dL
Nitrite: POSITIVE — AB
Protein, ur: NEGATIVE mg/dL
SPECIFIC GRAVITY, URINE: 1.015 (ref 1.005–1.030)
UROBILINOGEN UA: 0.2 mg/dL (ref 0.0–1.0)
pH: 6 (ref 5.0–8.0)

## 2015-06-24 LAB — BASIC METABOLIC PANEL
ANION GAP: 6 (ref 5–15)
BUN: 14 mg/dL (ref 6–20)
CHLORIDE: 106 mmol/L (ref 101–111)
CO2: 29 mmol/L (ref 22–32)
CREATININE: 0.93 mg/dL (ref 0.44–1.00)
Calcium: 8.5 mg/dL — ABNORMAL LOW (ref 8.9–10.3)
GFR calc non Af Amer: >60 mL/min (ref >60)
Glucose, Bld: 107 mg/dL — ABNORMAL HIGH (ref 65–99)
Potassium: 3.8 mmol/L (ref 3.5–5.1)
Sodium: 141 mmol/L (ref 135–145)

## 2015-06-24 LAB — LIPID PANEL
CHOL/HDL RATIO: 3.7 ratio
CHOLESTEROL: 128 mg/dL (ref 0–200)
HDL: 35 mg/dL — AB (ref >40)
LDL Cholesterol: 77 mg/dL (ref 0–99)
TRIGLYCERIDES: 79 mg/dL (ref <150)
VLDL: 16 mg/dL (ref 0–40)

## 2015-06-24 LAB — URINE MICROSCOPIC-ADD ON

## 2015-06-24 LAB — COMPREHENSIVE METABOLIC PANEL
ALK PHOS: 66 U/L (ref 38–126)
ALT: 5 U/L — ABNORMAL LOW (ref 14–54)
ANION GAP: 7 (ref 5–15)
AST: 21 U/L (ref 15–41)
Albumin: 3.7 g/dL (ref 3.5–5.0)
BUN: 16 mg/dL (ref 6–20)
CALCIUM: 8.6 mg/dL — AB (ref 8.9–10.3)
CO2: 27 mmol/L (ref 22–32)
Chloride: 107 mmol/L (ref 101–111)
Creatinine, Ser: 1.01 mg/dL — ABNORMAL HIGH (ref 0.44–1.00)
GFR calc non Af Amer: 58 mL/min — ABNORMAL LOW (ref >60)
Glucose, Bld: 90 mg/dL (ref 65–99)
Potassium: 3.9 mmol/L (ref 3.5–5.1)
SODIUM: 141 mmol/L (ref 135–145)
Total Bilirubin: 0.5 mg/dL (ref 0.3–1.2)
Total Protein: 7.2 g/dL (ref 6.5–8.1)

## 2015-06-24 LAB — TROPONIN I
Troponin I: <0.03 ng/mL (ref <0.031)
Troponin I: <0.03 ng/mL (ref <0.031)
Troponin I: <0.03 ng/mL (ref <0.031)

## 2015-06-24 LAB — SEDIMENTATION RATE: Sed Rate: 30 mm/hr — ABNORMAL HIGH (ref 0–22)

## 2015-06-24 MED ORDER — SODIUM CHLORIDE 0.9 % IV SOLN: 250.0000 mL | INTRAVENOUS | Status: DC | PRN

## 2015-06-24 MED ORDER — PANTOPRAZOLE SODIUM 40 MG PO TBEC
40.0000 mg | DELAYED_RELEASE_TABLET | Freq: Every day | ORAL | Status: DC
  Administered 2015-06-24 – 2015-06-25 (×2): 40 mg via ORAL
  Filled 2015-06-24 (×2): qty 1

## 2015-06-24 MED ORDER — CEFTRIAXONE SODIUM 1 G IJ SOLR
1.0000 g | INTRAMUSCULAR | Status: DC
  Filled 2015-06-24: qty 10

## 2015-06-24 MED ORDER — METOPROLOL TARTRATE 50 MG PO TABS
100.0000 mg | ORAL_TABLET | Freq: Two times a day (BID) | ORAL | Status: DC
  Administered 2015-06-24 – 2015-06-25 (×2): 100 mg via ORAL
  Filled 2015-06-24 (×2): qty 2

## 2015-06-24 MED ORDER — ALUM & MAG HYDROXIDE-SIMETH 200-200-20 MG/5ML PO SUSP: 30.0000 mL | Freq: Four times a day (QID) | ORAL | Status: DC | PRN

## 2015-06-24 MED ORDER — STROKE: EARLY STAGES OF RECOVERY BOOK
Freq: Once | Status: DC
  Filled 2015-06-24: qty 1

## 2015-06-24 MED ORDER — ONDANSETRON HCL 4 MG PO TABS
4.0000 mg | ORAL_TABLET | Freq: Four times a day (QID) | ORAL | Status: DC | PRN
Start: 2015-06-24 — End: 2015-06-25

## 2015-06-24 MED ORDER — SENNOSIDES-DOCUSATE SODIUM 8.6-50 MG PO TABS
1.0000 | ORAL_TABLET | Freq: Every evening | ORAL | Status: DC | PRN
  Filled 2015-06-24: qty 1

## 2015-06-24 MED ORDER — LISINOPRIL 10 MG PO TABS
40.0000 mg | ORAL_TABLET | Freq: Every day | ORAL | Status: DC
  Administered 2015-06-24 – 2015-06-25 (×2): 40 mg via ORAL
  Filled 2015-06-24 (×2): qty 4

## 2015-06-24 MED ORDER — ENOXAPARIN SODIUM 30 MG/0.3ML ~~LOC~~ SOLN: 30.0000 mg | SUBCUTANEOUS | Status: DC

## 2015-06-24 MED ORDER — INFLUENZA VAC SPLIT QUAD 0.5 ML IM SUSY
0.5000 mL | PREFILLED_SYRINGE | INTRAMUSCULAR | Status: AC
  Administered 2015-06-25: 0.5 mL via INTRAMUSCULAR
  Filled 2015-06-24 (×2): qty 0.5

## 2015-06-24 MED ORDER — IOHEXOL 350 MG/ML SOLN
150.0000 mL | Freq: Once | INTRAVENOUS | Status: AC | PRN
  Administered 2015-06-24: 150 mL via INTRAVENOUS

## 2015-06-24 MED ORDER — SODIUM CHLORIDE 0.9 % IJ SOLN
INTRAMUSCULAR | Status: AC
  Filled 2015-06-24: qty 60

## 2015-06-24 MED ORDER — SODIUM CHLORIDE 0.9 % IJ SOLN
3.0000 mL | Freq: Two times a day (BID) | INTRAMUSCULAR | Status: DC
  Administered 2015-06-24 – 2015-06-25 (×3): 3 mL via INTRAVENOUS

## 2015-06-24 MED ORDER — ACETAMINOPHEN 325 MG PO TABS: 650.0000 mg | ORAL_TABLET | Freq: Four times a day (QID) | ORAL | Status: DC | PRN

## 2015-06-24 MED ORDER — OXYCODONE HCL 5 MG PO TABS
5.0000 mg | ORAL_TABLET | ORAL | Status: DC | PRN
  Administered 2015-06-25: 5 mg via ORAL
  Filled 2015-06-24: qty 1

## 2015-06-24 MED ORDER — ASPIRIN 325 MG PO TABS
325.0000 mg | ORAL_TABLET | Freq: Every day | ORAL | Status: DC
  Administered 2015-06-24 – 2015-06-25 (×2): 325 mg via ORAL
  Filled 2015-06-24 (×2): qty 1

## 2015-06-24 MED ORDER — ACETAMINOPHEN 650 MG RE SUPP: 650.0000 mg | Freq: Four times a day (QID) | RECTAL | Status: DC | PRN

## 2015-06-24 MED ORDER — ENOXAPARIN SODIUM 40 MG/0.4ML ~~LOC~~ SOLN
40.0000 mg | SUBCUTANEOUS | Status: DC
  Administered 2015-06-24 – 2015-06-25 (×2): 40 mg via SUBCUTANEOUS
  Filled 2015-06-24 (×2): qty 0.4

## 2015-06-24 MED ORDER — NITROGLYCERIN 2 % TD OINT
0.5000 [in_us] | TOPICAL_OINTMENT | Freq: Four times a day (QID) | TRANSDERMAL | Status: DC
  Filled 2015-06-24: qty 1

## 2015-06-24 MED ORDER — HYDROMORPHONE HCL 1 MG/ML IJ SOLN: 0.5000 mg | INTRAMUSCULAR | Status: DC | PRN

## 2015-06-24 MED ORDER — SODIUM CHLORIDE 0.9 % IJ SOLN: 3.0000 mL | INTRAMUSCULAR | Status: DC | PRN

## 2015-06-24 MED ORDER — FUROSEMIDE 10 MG/ML IJ SOLN: 40.0000 mg | Freq: Once | INTRAMUSCULAR | Status: DC

## 2015-06-24 MED ORDER — ONDANSETRON HCL 4 MG/2ML IJ SOLN: 4.0000 mg | Freq: Four times a day (QID) | INTRAMUSCULAR | Status: DC | PRN

## 2015-06-24 NOTE — Progress Notes (Signed)
Patient transferred to 310 in stable condition via wheelchair with NT. Daughter at bedside present during transfer.

## 2015-06-24 NOTE — Progress Notes (Deleted)
Dr. Jerilee Hoh notified of patients C-diff toxin and antigen positive. MD to review chart after labs are available in Epic.

## 2015-06-24 NOTE — Evaluation (Signed)
Physical Therapy Evaluation Patient Details Name: Sabrina Curry MRN: [id number] DOB: 07/20/1951 Today's Date: 06/24/2015   History of Present Illness  HPI: Sabrina Curry is a 64 y.o. female with a history of Asthma, HTN, who presents to the ED with complaints of intermittent Chest pain over the past few months but was worse today described as sharp pain radiating into her Left Arm associated with SOB, Nausea and Lightheadedness but no Vomiting or Diaphoresis. She reports swelling of both of her legs over the past 2 days. She also reports having intermittent slurring of her speech as well for several months and has a episode during there day which was noticed by her daughter on a phone call. The episode resolved. She reports having lower ABD pain during the past 2 days and denies any diarrhea or dysuria and also denies any fevers or chills.   Clinical Impression   Pt is seen for eval this AM.  She was alert and cooperative, able to follow all directions.  Speech was slurred but it has been this way for several months.  Etiology is unknown.  She lives with her husband and mother, both of whom have chronic health problems.  From what pt describes, she has been under quite a bit of stress trying to care for both of them.  Currently, she is found to have generalized weakness but equal from left to right.  She describes pain in both LEs when questioned but does not display signs of pain when moving about.  She was able to transfer in and out of bed independently.  Because of generalized weakness, I instructed her in gait with a rolling walker and she was able to ambulate 62' with no instability or dyspnea.  O2 sat on room air with exertion was 94%.  She should be able to transition well to home at d/c.    Follow Up Recommendations No PT follow up    Equipment Recommendations  Rolling walker with 5" wheels    Recommendations for Other Services   none    Precautions / Restrictions  Precautions Precautions: None Restrictions Weight Bearing Restrictions: No      Mobility  Bed Mobility Overal bed mobility: Modified Independent                Transfers Overall transfer level: Modified independent Equipment used: Rolling walker (2 wheeled)                Ambulation/Gait Ambulation/Gait assistance: Modified independent (Device/Increase time) Ambulation Distance (Feet): 60 Feet Assistive device: Rolling walker (2 wheeled) Gait Pattern/deviations: WFL(Within Functional Limits);Trunk flexed   Gait velocity interpretation: <1.8 ft/sec, indicative of risk for recurrent falls    Stairs            Wheelchair Mobility    Modified Rankin (Stroke Patients Only)       Balance Overall balance assessment: No apparent balance deficits (not formally assessed)                                           Pertinent Vitals/Pain Pain Assessment: 0-10 Pain Score: 9  Pain Location: both LEs Pain Descriptors / Indicators: Aching Pain Intervention(s): Limited activity within patient's tolerance;Monitored during session;Other (comment) (notified RN)    Home Living Family/patient expects to be discharged to:: Private residence Living Arrangements: Spouse/significant other Available Help at Discharge: Family;Available 24 hours/day Type of Home: [place]  Access: Level entry     Home Layout: One level Home Equipment: None      Prior Function Level of Independence: Independent               Hand Dominance        Extremity/Trunk Assessment               Lower Extremity Assessment: Generalized weakness      Cervical / Trunk Assessment: Normal  Communication   Communication: Expressive difficulties (slurred speech)  Cognition Arousal/Alertness: Awake/alert Behavior During Therapy: WFL for tasks assessed/performed Overall Cognitive Status: Within Functional Limits for tasks assessed                       General Comments      Exercises        Assessment/Plan    PT Assessment Patent does not need any further PT services  PT Diagnosis     PT Problem List    PT Treatment Interventions     PT Goals (Current goals can be found in the Care Plan section) Acute Rehab PT Goals PT Goal Formulation: All assessment and education complete, DC therapy    Frequency     Barriers to discharge        Co-evaluation               End of Session Equipment Utilized During Treatment: Gait belt Activity Tolerance: Patient tolerated treatment well;No increased pain Patient left: in bed;with call bell/phone within reach Nurse Communication: Mobility status         Time: 6244-6950 PT Time Calculation (min) (ACUTE ONLY): 28 min   Charges:   PT Evaluation $Initial PT Evaluation Tier I: 1 Procedure     PT G CodesSable Feil  PT 06/24/2015, 9:20 AM [phone]

## 2015-06-24 NOTE — Consult Note (Signed)
Nazlini A. Merlene Laughter, MD     [email or web address]          Sabrina Curry is an 64 y.o. female.   ASSESSMENT/PLAN: 1.       The patient's symptoms likely are due to acute urinary tract infection given the negative imaging for acute infarct. There is evidence suggestive of a remote infarct which could cause recurrence of previous symptoms. The MRI findings involving the insular cortex is concerning and could represent an old infarct but also could be a source of epileptic spells. Consequently, the EEG will be obtained. The patient has been will be increased to 162 mg daily.    2. Subacute episodes of instability of her gait and the slurred speech. The episodic nature is suggestive of possible complex partial seizures.   The patient presents with about 2 month history of episodes of Gait instability and slurred speech. The symptoms seem to gotten worse the last few days and hence the patient was admitted to the hospital for further evaluation. She has had some associated dyspnea. She has chronic pain issues. She does not report focal weakness or numbness. She does report having some headaches. No GI or GU symptoms are reported. The review of systems otherwise negative.  GENERAL: Pleasant obese female in no acute distress.  HEENT: Supple. Atraumatic normocephalic.   ABDOMEN: soft  EXTREMITIES: No edema. Significant arthritic changes of the knees. Mild to moderate varicosities of the legs.   BACK: Normal.  SKIN: Normal by inspection.    MENTAL STATUS: Alert and oriented. Speech, language and cognition are generally intact. Judgment and insight normal.   CRANIAL NERVES: Pupils are equal, round and reactive to light and accommodation; extra ocular movements are full, there is no significant nystagmus; visual fields are full; upper and lower facial muscles are normal in strength and symmetric, there is no flattening of the nasolabial folds; tongue is midline; uvula is  midline; shoulder elevation is normal.  MOTOR: Normal tone, bulk and strength; no pronator drift.  COORDINATION: Left finger to nose is normal, right finger to nose is normal, No rest tremor; no intention tremor; no postural tremor; no bradykinesia.  REFLEXES: Deep tendon reflexes are symmetrical and normal. Babinski reflexes are flexor bilaterally.   SENSATION: Normal to light touch.       The patient's brain MRI is reviewed in person. There is multiple deep white matter signal seen on FLAIR imaging. The severity is moderate. There is a more prominent area juxtacortical increased signal seen in the right insular cortex. No acute lesions are seen on diffusion imaging.  Blood pressure 116/65, pulse 62, temperature 98.6 F (37 C), temperature source Oral, resp. rate 16, height 4' 10"  (1.473 m), weight 116.302 kg (256 lb 6.4 oz), SpO2 94 %.  Past Medical History  Diagnosis Date  . Asthma   . Anxiety   . GERD (gastroesophageal reflux disease)   . Headache(784.0)   . Arthritis   . Chronic back pain   . Hypertension   . Obstructive sleep apnea     On CPAP by nasal prong  . Thoracic aortic aneurysm [facility]) 12/2012     12/2012: 4.3 cm fusiform aneurysm-ascending thoracic aorta  . Palpitations 12/2012    ? SVT  . H/O dizziness   . Seasonal allergies   . Hx of migraines   . Knee pain, bilateral   . H/O shortness of breath     Past Surgical History  Procedure Laterality Date  . Abdominal hysterectomy    .  Cholecystectomy      [facility]  . Back surgery      had staff infection of back  . Umbilical hernia repair    . Hernia repair  2009    inscional hernia repair x2-3  . Appendectomy      with gallbladder removal  . Cataract extraction w/phaco  01/16/2012    Procedure: CATARACT EXTRACTION PHACO AND INTRAOCULAR LENS PLACEMENT (IOC);  Surgeon: Elta Guadeloupe T. Gershon Crane, MD;  Location: [facility];  Service: Ophthalmology;  Laterality: Right;  CDE 6.12    Family History  Problem Relation  Age of Onset  . Anesthesia problems Neg Hx   . Hypotension Neg Hx   . Pseudochol deficiency Neg Hx   . Malignant hyperthermia Neg Hx   . Hypertension Mother   . Diabetes Mother   . Hyperlipidemia Father     Also hypertension and an aneurysm    Social History:  reports that she has never smoked. She has never used smokeless tobacco. She reports that she does not drink alcohol or use illicit drugs.  Allergies:  Allergies  Allergen Reactions  . Vancomycin Cross Reactors Anaphylaxis    Medications: Prior to Admission medications   Medication Sig Start Date End Date Taking? Authorizing Provider  amLODipine (NORVASC) 10 MG tablet TAKE ONE TABLET BY MOUTH DAILY. 06/21/15  Yes Herminio Commons, MD  aspirin 81 MG tablet Take 81 mg by mouth daily.   Yes Historical Provider, MD  chlorthalidone (HYGROTON) 25 MG tablet Take 1 tablet (25 mg total) by mouth daily. 12/22/14  Yes Herminio Commons, MD  isosorbide mononitrate (IMDUR) 30 MG 24 hr tablet Take 1 tablet (30 mg total) by mouth daily. 12/22/14  Yes Herminio Commons, MD  lisinopril (PRINIVIL,ZESTRIL) 40 MG tablet Take 1 tablet (40 mg total) by mouth daily. 11/16/14  Yes Herminio Commons, MD  metoprolol (LOPRESSOR) 100 MG tablet TAKE ONE TABLET BY MOUTH TWICE DAILY. 04/19/15  Yes Herminio Commons, MD  potassium chloride (K-DUR) 10 MEQ tablet Take 1 tablet (10 mEq total) by mouth 2 (two) times daily. 12/22/14  Yes Herminio Commons, MD    Scheduled Meds: .  stroke: mapping our early stages of recovery book   Does not apply Once  . aspirin  325 mg Oral Daily  . enoxaparin (LOVENOX) injection  40 mg Subcutaneous Q24H  . furosemide  40 mg Intravenous Once  . [START ON 06/25/2015] Influenza vac split quadrivalent PF  0.5 mL Intramuscular Tomorrow-1000  . lisinopril  40 mg Oral Daily  . metoprolol  100 mg Oral BID  . pantoprazole  40 mg Oral Daily  . sodium chloride  3 mL Intravenous Q12H   Continuous Infusions:  PRN Meds:.sodium  chloride, acetaminophen **OR** acetaminophen, alum & mag hydroxide-simeth, ondansetron **OR** ondansetron (ZOFRAN) IV, oxyCODONE, senna-docusate, sodium chloride     Results for orders placed or performed during the hospital encounter of 06/23/15 (from the past 48 hour(s))  CBC with Differential     Status: None   Collection Time: 06/23/15 11:00 PM  Result Value Ref Range   WBC 8.4 4.0 - 10.5 K/uL   RBC 4.41 3.87 - 5.11 MIL/uL   Hemoglobin 13.7 12.0 - 15.0 g/dL   HCT 42.4 36.0 - 46.0 %   MCV 96.1 78.0 - 100.0 fL   MCH 31.1 26.0 - 34.0 pg   MCHC 32.3 30.0 - 36.0 g/dL   RDW 13.1 11.5 - 15.5 %   Platelets 168 150 - 400 K/uL  Neutrophils Relative % 47 %   Neutro Abs 4.0 1.7 - 7.7 K/uL   Lymphocytes Relative 42 %   Lymphs Abs 3.5 0.7 - 4.0 K/uL   Monocytes Relative 7 %   Monocytes Absolute 0.6 0.1 - 1.0 K/uL   Eosinophils Relative 3 %   Eosinophils Absolute 0.2 0.0 - 0.7 K/uL   Basophils Relative 1 %   Basophils Absolute 0.1 0.0 - 0.1 K/uL  Comprehensive metabolic panel     Status: Abnormal   Collection Time: 06/23/15 11:40 PM  Result Value Ref Range   Sodium 141 135 - 145 mmol/L   Potassium 3.9 3.5 - 5.1 mmol/L   Chloride 107 101 - 111 mmol/L   CO2 27 22 - 32 mmol/L   Glucose, Bld 90 65 - 99 mg/dL   BUN 16 6 - 20 mg/dL   Creatinine, Ser 1.01 (H) 0.44 - 1.00 mg/dL   Calcium 8.6 (L) 8.9 - 10.3 mg/dL   Total Protein 7.2 6.5 - 8.1 g/dL   Albumin 3.7 3.5 - 5.0 g/dL   AST 21 15 - 41 U/L   ALT 5 (L) 14 - 54 U/L   Alkaline Phosphatase 66 38 - 126 U/L   Total Bilirubin 0.5 0.3 - 1.2 mg/dL   GFR calc non Af Amer 58 (L) >60 mL/min   GFR calc Af Amer >60 >60 mL/min    Comment: (NOTE) The eGFR has been calculated using the CKD EPI equation. This calculation has not been validated in all clinical situations. eGFR's persistently <60 mL/min signify possible Chronic Kidney Disease.    Anion gap 7 5 - 15  Troponin I     Status: None   Collection Time: 06/23/15 11:40 PM  Result  Value Ref Range   Troponin I <0.03 <0.031 ng/mL    Comment:        NO INDICATION OF MYOCARDIAL INJURY.   Urinalysis, Routine w reflex microscopic (not at [facility])     Status: Abnormal   Collection Time: 06/23/15 11:50 PM  Result Value Ref Range   Color, Urine YELLOW YELLOW   APPearance HAZY (A) CLEAR   Specific Gravity, Urine 1.015 1.005 - 1.030   pH 6.0 5.0 - 8.0   Glucose, UA NEGATIVE NEGATIVE mg/dL   Hgb urine dipstick NEGATIVE NEGATIVE   Bilirubin Urine NEGATIVE NEGATIVE   Ketones, ur NEGATIVE NEGATIVE mg/dL   Protein, ur NEGATIVE NEGATIVE mg/dL   Urobilinogen, UA 0.2 0.0 - 1.0 mg/dL   Nitrite POSITIVE (A) NEGATIVE   Leukocytes, UA SMALL (A) NEGATIVE  Urine microscopic-add on     Status: Abnormal   Collection Time: 06/23/15 11:50 PM  Result Value Ref Range   Squamous Epithelial / LPF MANY (A) RARE   WBC, UA 11-20 <3 WBC/hpf   Bacteria, UA MANY (A) RARE  Troponin I     Status: None   Collection Time: 06/24/15  2:20 AM  Result Value Ref Range   Troponin I <0.03 <0.031 ng/mL    Comment:        NO INDICATION OF MYOCARDIAL INJURY.   Basic metabolic panel     Status: Abnormal   Collection Time: 06/24/15  4:04 AM  Result Value Ref Range   Sodium 141 135 - 145 mmol/L   Potassium 3.8 3.5 - 5.1 mmol/L   Chloride 106 101 - 111 mmol/L   CO2 29 22 - 32 mmol/L   Glucose, Bld 107 (H) 65 - 99 mg/dL   BUN 14 6 - 20 mg/dL  Creatinine, Ser 0.93 0.44 - 1.00 mg/dL   Calcium 8.5 (L) 8.9 - 10.3 mg/dL   GFR calc non Af Amer >60 >60 mL/min   GFR calc Af Amer >60 >60 mL/min    Comment: (NOTE) The eGFR has been calculated using the CKD EPI equation. This calculation has not been validated in all clinical situations. eGFR's persistently <60 mL/min signify possible Chronic Kidney Disease.    Anion gap 6 5 - 15  CBC     Status: Abnormal   Collection Time: 06/24/15  4:04 AM  Result Value Ref Range   WBC 7.4 4.0 - 10.5 K/uL   RBC 3.95 3.87 - 5.11 MIL/uL   Hemoglobin 11.9 (L) 12.0 - 15.0  g/dL   HCT 37.8 36.0 - 46.0 %   MCV 95.7 78.0 - 100.0 fL   MCH 30.1 26.0 - 34.0 pg   MCHC 31.5 30.0 - 36.0 g/dL   RDW 13.1 11.5 - 15.5 %   Platelets 144 (L) 150 - 400 K/uL  Lipid panel     Status: Abnormal   Collection Time: 06/24/15  4:04 AM  Result Value Ref Range   Cholesterol 128 0 - 200 mg/dL   Triglycerides 79 <150 mg/dL   HDL 35 (L) >40 mg/dL   Total CHOL/HDL Ratio 3.7 RATIO   VLDL 16 0 - 40 mg/dL   LDL Cholesterol 77 0 - 99 mg/dL    Comment:        Total Cholesterol/HDL:CHD Risk Coronary Heart Disease Risk Table                     Men   Women  1/2 Average Risk   3.4   3.3  Average Risk       5.0   4.4  2 X Average Risk   9.6   7.1  3 X Average Risk  23.4   11.0        Use the calculated Patient Ratio above and the CHD Risk Table to determine the patient's CHD Risk.        ATP III CLASSIFICATION (LDL):  <100     mg/dL   Optimal  100-129  mg/dL   Near or Above                    Optimal  130-159  mg/dL   Borderline  160-189  mg/dL   High  >190     mg/dL   Very High   Troponin I     Status: None   Collection Time: 06/24/15  8:40 AM  Result Value Ref Range   Troponin I <0.03 <0.031 ng/mL    Comment:        NO INDICATION OF MYOCARDIAL INJURY.   Troponin I     Status: None   Collection Time: 06/24/15  2:04 PM  Result Value Ref Range   Troponin I <0.03 <0.031 ng/mL    Comment:        NO INDICATION OF MYOCARDIAL INJURY.     Studies/Results:  MRI/MRA BRAIN  CLINICAL DATA: Weakness and slurred speech. The examination had to be discontinued prior to completion due to claustrophobia.  EXAM: MRI HEAD WITHOUT CONTRAST  TECHNIQUE: Multiplanar, multiecho pulse sequences of the brain and surrounding structures were obtained without intravenous contrast.  COMPARISON: CT head without contrast from the same day  FINDINGS: The diffusion-weighted images demonstrate the restricted diffusion. Periventricular and subcortical T2 changes bilaterally are  greater than expected for  age. A remote lacunar infarct is present in the right subinsular white matter and inferior lentiform nucleus.  Remote lacunar infarcts are present in the inferior cerebellum bilaterally and left paramedian pons. Remote ischemic changes are present in the thalami bilaterally.  Flow is present in the major intracranial arteries. A right lens replacement is noted. The globes and orbits are otherwise intact. Mild mucosal thickening is present in the left maxillary sinus. The remaining paranasal sinuses and the mastoid air cells are clear.  IMPRESSION: 1. No acute intracranial abnormality. 2. 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 3. 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    CTA CHEST/ABD IMPRESSION: 1. No evidence for aortic dissection. 2. Mild aneurysmal dilatation of the ascending thoracic aorta is only minimally changed from the prior study, measuring up to 4.4 cm in AP dimension. Recommend annual imaging followup by CTA or MRA. This recommendation follows 2010 ACCF/AHA/AATS/ACR/ASA/SCA/SCAI/SIR/STS/SVM Guidelines for the Diagnosis and Management of Patients with Thoracic Aortic Disease. Circulation. 2010; 121: L935-T017. 3. No evidence of central pulmonary embolus. 4. Mild right basilar atelectasis noted. Lungs otherwise clear. 5. Limited evaluation of the abdominal vasculature due to limitations in timing. No focal abnormality seen.   Mittie Knittel A. Merlene Laughter, M.D.  Diplomate, Tax adviser of Psychiatry and Neurology ( Neurology). 06/24/2015, 8:00 PM

## 2015-06-24 NOTE — Progress Notes (Signed)
Talked with patient about wearing CPAP as order per MD, pt states she can't wear anything over her face and she has always wore [place].  And that she needs to get a family MD to get machine for home. I expressed to her that we do have the nasal mask instead of the prongs if she would like to try but she refuse to try because she just can't wear anything on her face... I checked her Spo2 90% on room air , o2 at 2lpm cann applied for the night. Nurse informed.

## 2015-06-24 NOTE — ED Provider Notes (Signed)
Patient seen and evaluated by Dr. Alvino Chapel, signed out to me to evaluate results of CT angiograms of neck, chest, abdomen, pelvis. CT angiogram of the neck was not able to be obtained, but no acute findings on CT angiograms of tests, abdomen, pelvis. Case is discussed with Dr. Arnoldo Morale of [facility], who agrees to admit the patient. Equivalent information can obtained by MR angiogram in the morning.  Results for orders placed or performed during the hospital encounter of 06/23/15  CBC with Differential  Result Value Ref Range   WBC 8.4 4.0 - 10.5 K/uL   RBC 4.41 3.87 - 5.11 MIL/uL   Hemoglobin 13.7 12.0 - 15.0 g/dL   HCT 42.4 36.0 - 46.0 %   MCV 96.1 78.0 - 100.0 fL   MCH 31.1 26.0 - 34.0 pg   MCHC 32.3 30.0 - 36.0 g/dL   RDW 13.1 11.5 - 15.5 %   Platelets 168 150 - 400 K/uL   Neutrophils Relative % 47 %   Neutro Abs 4.0 1.7 - 7.7 K/uL   Lymphocytes Relative 42 %   Lymphs Abs 3.5 0.7 - 4.0 K/uL   Monocytes Relative 7 %   Monocytes Absolute 0.6 0.1 - 1.0 K/uL   Eosinophils Relative 3 %   Eosinophils Absolute 0.2 0.0 - 0.7 K/uL   Basophils Relative 1 %   Basophils Absolute 0.1 0.0 - 0.1 K/uL  Comprehensive metabolic panel  Result Value Ref Range   Sodium 141 135 - 145 mmol/L   Potassium 3.9 3.5 - 5.1 mmol/L   Chloride 107 101 - 111 mmol/L   CO2 27 22 - 32 mmol/L   Glucose, Bld 90 65 - 99 mg/dL   BUN 16 6 - 20 mg/dL   Creatinine, Ser 1.01 (H) 0.44 - 1.00 mg/dL   Calcium 8.6 (L) 8.9 - 10.3 mg/dL   Total Protein 7.2 6.5 - 8.1 g/dL   Albumin 3.7 3.5 - 5.0 g/dL   AST 21 15 - 41 U/L   ALT 5 (L) 14 - 54 U/L   Alkaline Phosphatase 66 38 - 126 U/L   Total Bilirubin 0.5 0.3 - 1.2 mg/dL   GFR calc non Af Amer 58 (L) >60 mL/min   GFR calc Af Amer >60 >60 mL/min   Anion gap 7 5 - 15  Troponin I  Result Value Ref Range   Troponin I <0.03 <0.031 ng/mL  Urinalysis, Routine w reflex microscopic (not at [facility])  Result Value Ref Range   Color, Urine YELLOW YELLOW   APPearance HAZY  (A) CLEAR   Specific Gravity, Urine 1.015 1.005 - 1.030   pH 6.0 5.0 - 8.0   Glucose, UA NEGATIVE NEGATIVE mg/dL   Hgb urine dipstick NEGATIVE NEGATIVE   Bilirubin Urine NEGATIVE NEGATIVE   Ketones, ur NEGATIVE NEGATIVE mg/dL   Protein, ur NEGATIVE NEGATIVE mg/dL   Urobilinogen, UA 0.2 0.0 - 1.0 mg/dL   Nitrite POSITIVE (A) NEGATIVE   Leukocytes, UA SMALL (A) NEGATIVE  Urine microscopic-add on  Result Value Ref Range   Squamous Epithelial / LPF MANY (A) RARE   WBC, UA 11-20 <3 WBC/hpf   Bacteria, UA MANY (A) RARE   Dg Chest 2 View  06/23/2015   CLINICAL DATA:  Moderate intermittent chest pain with associated bilateral lower extremity swelling and generalized weakness, onset several months ago.  EXAM: CHEST  2 VIEW  COMPARISON:  12/09/2012  FINDINGS: There is mild unchanged right hemidiaphragm elevation the lungs are clear. There is no pleural effusion.  Hilar and mediastinal contours are unchanged and remarkable only for moderate aortic tortuosity.  IMPRESSION: No acute cardiopulmonary findings.   Electronically Signed   By: Andreas Newport M.D.   On: 06/23/2015 23:51   Ct Head Wo Contrast  06/24/2015   CLINICAL DATA:  Slurred speech and generalized weakness.  EXAM: CT HEAD WITHOUT CONTRAST  TECHNIQUE: Contiguous axial images were obtained from the base of the skull through the vertex without intravenous contrast.  COMPARISON:  None.  FINDINGS: There is no intracranial hemorrhage, mass or evidence of acute infarction. There is slight generalized atrophy. There is mild chronic microvascular ischemic change, particularly in the left posterior frontal white matter where there may be chronic white matter infarction. There is no significant extra-axial fluid collection.  No acute intracranial findings are evident.  IMPRESSION: Slight atrophy and mild chronic white matter changes, likely small vessel disease. No acute findings.   Electronically Signed   By: Andreas Newport M.D.   On: 06/24/2015  02:05   Ct Angio Chest Aorta W/cm &/or Wo/cm  06/24/2015   CLINICAL DATA:  Chronic moderate intermittent chest pain, with bilateral leg swelling, slurred speech, generalized weakness and leg pain. Nausea. Initial encounter.  EXAM: CT ANGIOGRAPHY CHEST, ABDOMEN AND PELVIS  TECHNIQUE: Multidetector CT imaging through the chest, abdomen and pelvis was performed using the standard protocol during bolus administration of intravenous contrast. Multiplanar reconstructed images and MIPs were obtained and reviewed to evaluate the vascular anatomy.  CONTRAST:  1104m OMNIPAQUE IOHEXOL 350 MG/ML SOLN  COMPARISON:  Chest radiograph performed 06/23/2015, and CTA of the chest performed 03/17/2015. CT of the abdomen and pelvis performed 09/30/2004  FINDINGS: CTA CHEST FINDINGS  There is no evidence of aortic dissection. Mild aneurysmal dilatation of the ascending thoracic aorta is only minimally changed from the prior study, measuring up to 4.4 cm in AP dimension. No calcific atherosclerotic disease is seen. There is no evidence of luminal narrowing. The great vessels are grossly unremarkable in appearance.  There is no evidence of central pulmonary embolus.  Mild right basilar atelectasis is noted. The lungs are otherwise clear. There is no evidence of significant focal consolidation, pleural effusion or pneumothorax. No masses are identified; no abnormal focal contrast enhancement is seen.  The mediastinum is unremarkable in appearance. No mediastinal lymphadenopathy is seen. No pericardial effusion is identified. No axillary lymphadenopathy is seen. The visualized portions of the thyroid gland are unremarkable in appearance.  The visualized portions of the liver and spleen are unremarkable. The visualized portions of the pancreas, gallbladder, stomach, adrenal glands and kidneys are within normal limits.  No acute osseous abnormalities are seen.  Review of the MIP images confirms the above findings.  CTA ABDOMEN AND PELVIS  FINDINGS  The abdominal aorta is difficult to fully assess due to the timing of the contrast bolus. Much of the abdominal aorta has not yet opacified with contrast. The celiac trunk, superior mesenteric artery, bilateral renal arteries and inferior mesenteric artery are thought to be patent, though not well characterized.  The inferior vena cava is grossly unremarkable in appearance.  The liver and spleen are unremarkable in appearance. The gallbladder is within normal limits. The pancreas and adrenal glands are unremarkable.  The kidneys are unremarkable in appearance. There is no evidence of hydronephrosis. No renal or ureteral stones are seen. Mild nonspecific perinephric stranding is noted bilaterally.  No free fluid is identified. The small bowel is unremarkable in appearance. The stomach is within normal limits. No acute vascular abnormalities  are seen.  The patient is status post appendectomy. The colon is unremarkable in appearance. There is mild bulging of the anterior abdominal wall, without a focal hernia.  The bladder is mildly distended and grossly unremarkable. The patient is status post hysterectomy. No suspicious adnexal masses are seen. No inguinal lymphadenopathy is seen.  No acute osseous abnormalities are identified. Vacuum phenomenon is noted at the lower lumbar spine.  Review of the MIP images confirms the above findings.  IMPRESSION: 1. No evidence for aortic dissection. 2. Mild aneurysmal dilatation of the ascending thoracic aorta is only minimally changed from the prior study, measuring up to 4.4 cm in AP dimension. Recommend annual imaging followup by CTA or MRA. This recommendation follows 2010 ACCF/AHA/AATS/ACR/ASA/SCA/SCAI/SIR/STS/SVM Guidelines for the Diagnosis and Management of Patients with Thoracic Aortic Disease. Circulation. 2010; 121: S970-Y637. 3. No evidence of central pulmonary embolus. 4. Mild right basilar atelectasis noted.  Lungs otherwise clear. 5. Limited evaluation of  the abdominal vasculature due to limitations in timing. No focal abnormality seen.   Electronically Signed   By: Garald Balding M.D.   On: 06/24/2015 02:11   Ct Cta Abd/pel W/cm &/or W/o Cm  06/24/2015   CLINICAL DATA:  Chronic moderate intermittent chest pain, with bilateral leg swelling, slurred speech, generalized weakness and leg pain. Nausea. Initial encounter.  EXAM: CT ANGIOGRAPHY CHEST, ABDOMEN AND PELVIS  TECHNIQUE: Multidetector CT imaging through the chest, abdomen and pelvis was performed using the standard protocol during bolus administration of intravenous contrast. Multiplanar reconstructed images and MIPs were obtained and reviewed to evaluate the vascular anatomy.  CONTRAST:  164m OMNIPAQUE IOHEXOL 350 MG/ML SOLN  COMPARISON:  Chest radiograph performed 06/23/2015, and CTA of the chest performed 03/17/2015. CT of the abdomen and pelvis performed 09/30/2004  FINDINGS: CTA CHEST FINDINGS  There is no evidence of aortic dissection. Mild aneurysmal dilatation of the ascending thoracic aorta is only minimally changed from the prior study, measuring up to 4.4 cm in AP dimension. No calcific atherosclerotic disease is seen. There is no evidence of luminal narrowing. The great vessels are grossly unremarkable in appearance.  There is no evidence of central pulmonary embolus.  Mild right basilar atelectasis is noted. The lungs are otherwise clear. There is no evidence of significant focal consolidation, pleural effusion or pneumothorax. No masses are identified; no abnormal focal contrast enhancement is seen.  The mediastinum is unremarkable in appearance. No mediastinal lymphadenopathy is seen. No pericardial effusion is identified. No axillary lymphadenopathy is seen. The visualized portions of the thyroid gland are unremarkable in appearance.  The visualized portions of the liver and spleen are unremarkable. The visualized portions of the pancreas, gallbladder, stomach, adrenal glands and kidneys are  within normal limits.  No acute osseous abnormalities are seen.  Review of the MIP images confirms the above findings.  CTA ABDOMEN AND PELVIS FINDINGS  The abdominal aorta is difficult to fully assess due to the timing of the contrast bolus. Much of the abdominal aorta has not yet opacified with contrast. The celiac trunk, superior mesenteric artery, bilateral renal arteries and inferior mesenteric artery are thought to be patent, though not well characterized.  The inferior vena cava is grossly unremarkable in appearance.  The liver and spleen are unremarkable in appearance. The gallbladder is within normal limits. The pancreas and adrenal glands are unremarkable.  The kidneys are unremarkable in appearance. There is no evidence of hydronephrosis. No renal or ureteral stones are seen. Mild nonspecific perinephric stranding is noted bilaterally.  No  free fluid is identified. The small bowel is unremarkable in appearance. The stomach is within normal limits. No acute vascular abnormalities are seen.  The patient is status post appendectomy. The colon is unremarkable in appearance. There is mild bulging of the anterior abdominal wall, without a focal hernia.  The bladder is mildly distended and grossly unremarkable. The patient is status post hysterectomy. No suspicious adnexal masses are seen. No inguinal lymphadenopathy is seen.  No acute osseous abnormalities are identified. Vacuum phenomenon is noted at the lower lumbar spine.  Review of the MIP images confirms the above findings.  IMPRESSION: 1. No evidence for aortic dissection. 2. Mild aneurysmal dilatation of the ascending thoracic aorta is only minimally changed from the prior study, measuring up to 4.4 cm in AP dimension. Recommend annual imaging followup by CTA or MRA. This recommendation follows 2010 ACCF/AHA/AATS/ACR/ASA/SCA/SCAI/SIR/STS/SVM Guidelines for the Diagnosis and Management of Patients with Thoracic Aortic Disease. Circulation. 2010; 121:  R173-V670. 3. No evidence of central pulmonary embolus. 4. Mild right basilar atelectasis noted.  Lungs otherwise clear. 5. Limited evaluation of the abdominal vasculature due to limitations in timing. No focal abnormality seen.   Electronically Signed   By: Garald Balding M.D.   On: 06/24/2015 14:10      Delora Fuel, MD 30/13/14 [id number]

## 2015-06-24 NOTE — Evaluation (Signed)
Clinical/Bedside Swallow Evaluation Patient Details  Name: Sabrina Curry MRN: [id number] Date of Birth: June 13, 1951  Today's Date: 06/24/2015 Time: SLP Start Time (ACUTE ONLY): 3710 SLP Stop Time (ACUTE ONLY): 1515 SLP Time Calculation (min) (ACUTE ONLY): 30 min  Past Medical History:  Past Medical History  Diagnosis Date  . Asthma   . Anxiety   . GERD (gastroesophageal reflux disease)   . Headache(784.0)   . Arthritis   . Chronic back pain   . Hypertension   . Obstructive sleep apnea     On CPAP by nasal prong  . Thoracic aortic aneurysm [facility]) 12/2012     12/2012: 4.3 cm fusiform aneurysm-ascending thoracic aorta  . Palpitations 12/2012    ? SVT  . H/O dizziness   . Seasonal allergies   . Hx of migraines   . Knee pain, bilateral   . H/O shortness of breath    Past Surgical History:  Past Surgical History  Procedure Laterality Date  . Abdominal hysterectomy    . Cholecystectomy      [facility]  . Back surgery      had staff infection of back  . Umbilical hernia repair    . Hernia repair  2009    inscional hernia repair x2-3  . Appendectomy      with gallbladder removal  . Cataract extraction w/phaco  01/16/2012    Procedure: CATARACT EXTRACTION PHACO AND INTRAOCULAR LENS PLACEMENT (IOC);  Surgeon: Elta Guadeloupe T. Gershon Crane, MD;  Location: [facility];  Service: Ophthalmology;  Laterality: Right;  CDE 6.12   HPI:  Sabrina Curry is a 64 y.o. female with a history of Asthma, HTN, who presents to the ED with complaints of intermittent Chest pain over the past few months but was worse today described as sharp pain radiating into her Left Arm associated with SOB, Nausea and Lightheadedness but no Vomiting or Diaphoresis. She reports swelling of both of her legs over the past 2 days. She also reports having intermittent slurring of her speech as well for several months and has a episode during there day which was noticed by her daughter on a phone call. The episode resolved. She reports  having lower ABD pain during the past 2 days and denies any diarrhea or dysuria and also denies any fevers or chills.    Assessment / Plan / Recommendation Clinical Impression  Sabrina Curry was evaluated at bedside with ice chips, water, applesauce, and graham crackers. She showed no overt signs or symptoms of aspiration; swallow appears WFL at this time. Pt and daughter report that her speech is "good right now" and at baseline. When she experiences dysarthria, daughter reports that it can last for several days. Pt reports "feeling different" and is aware when her speech is "slurred". Speech intelligibility strategies were reviewed with pt/daughter. No further SLP services indicated at this time. Continue diet as ordered.     Aspiration Risk  None    Diet Recommendation Age appropriate regular solids;Thin   Medication Administration: Whole meds with liquid    Other  Recommendations Oral Care Recommendations: Oral care BID   Follow Up Recommendations   N/A   Frequency and Duration        Pertinent Vitals/Pain VSS        Swallow Study Prior Functional Status       General Date of Onset: 06/23/15 Other Pertinent Information: Sabrina Curry is a 64 y.o. female with a history of Asthma, HTN, who presents to the ED with complaints of intermittent  Chest pain over the past few months but was worse today described as sharp pain radiating into her Left Arm associated with SOB, Nausea and Lightheadedness but no Vomiting or Diaphoresis. She reports swelling of both of her legs over the past 2 days. She also reports having intermittent slurring of her speech as well for several months and has a episode during there day which was noticed by her daughter on a phone call. The episode resolved. She reports having lower ABD pain during the past 2 days and denies any diarrhea or dysuria and also denies any fevers or chills.  Type of Study: Bedside swallow evaluation Previous Swallow  Assessment: None on record Diet Prior to this Study: Regular;Thin liquids Temperature Spikes Noted: No Respiratory Status: Room air History of Recent Intubation: No Behavior/Cognition: Alert;Cooperative;Pleasant mood Oral Cavity - Dentition: Poor condition;Adequate natural dentition/normal for age Self-Feeding Abilities: Able to feed self Patient Positioning: Upright in bed Baseline Vocal Quality: Normal Volitional Cough: Strong Volitional Swallow: Able to elicit    Oral/Motor/Sensory Function Overall Oral Motor/Sensory Function: Appears within functional limits for tasks assessed Labial ROM: Within Functional Limits Labial Symmetry: Within Functional Limits Labial Strength: Within Functional Limits Labial Sensation: Within Functional Limits Lingual ROM: Within Functional Limits Lingual Symmetry: Within Functional Limits Lingual Strength: Within Functional Limits Lingual Sensation: Within Functional Limits Facial ROM: Within Functional Limits Facial Symmetry: Within Functional Limits Facial Strength: Within Functional Limits Facial Sensation: Within Functional Limits Velum: Within Functional Limits Mandible: Within Functional Limits   Ice Chips Ice chips: Within functional limits Presentation: Spoon   Thin Liquid Thin Liquid: Within functional limits Presentation: Self Fed;Straw;Cup    Nectar Thick Nectar Thick Liquid: Not tested   Honey Thick     Puree Puree: Within functional limits Presentation: Spoon   Solid   Thank you,  Genene Churn, CCC-SLP [phone]     Solid: Within functional limits Presentation: Self Fed       PORTER,DABNEY 06/24/2015,6:48 PM

## 2015-06-24 NOTE — H&P (Addendum)
[facility] Admission History and Physical       An Lannan KGU:[id number] DOB: 1951-04-05 DOA: 06/23/2015  Referring physician:  PCP: Neale Burly, MD  Specialists:   Chief Complaint: Chest pain and Slurred Speech  HPI: Sabrina Curry is a 64 y.o. female with a history of Asthma, HTN, who presents to the ED with complaints of intermittent Chest pain over the past few months but was  worse today described as sharp pain radiating into her Left Arm associated with SOB, Nausea and Lightheadedness but no Vomiting or Diaphoresis.   She reports swelling of both of her legs over the past 2 days.  She also reports having intermittent slurring of her speech as well for several months and has a episode during there day which was noticed by her daughter on a phone call.  The episode resolved.   She reports having lower ABD pain during the past 2 days and denies any diarrhea or dysuria and also denies any fevers or chills.      Review of Systems:  Constitutional: No Weight Loss, No Weight Gain, Night Sweats, Fevers, Chills, Dizziness, +Light Headedness, Fatigue, or Generalized Weakness HEENT: No Headaches, Difficulty Swallowing,Tooth/Dental Problems,Sore Throat,  No Sneezing, Rhinitis, Ear Ache, Nasal Congestion, or Post Nasal Drip,  Cardio-vascular:   +Chest pain, Orthopnea, PND, +Edema in Lower Extremities, Anasarca, Dizziness, Palpitations  Resp: +Dyspnea, No DOE, No Productive Cough, No Non-Productive Cough, No Hemoptysis, No Wheezing.    GI: No Heartburn, Indigestion, +Abdominal Pain, +Nausea, Vomiting, Diarrhea, Constipation, Hematemesis, Hematochezia, Melena, Change in Bowel Habits,  Loss of Appetite  GU: No Dysuria, No Change in Color of Urine, No Urgency or Urinary Frequency, No Flank pain.  Musculoskeletal: No Joint Pain or Swelling, No Decreased Range of Motion, No Back Pain.  Neurologic: No Syncope, No Seizures, Muscle Weakness, Paresthesia, Vision Disturbance or Loss, No  Diplopia, No Vertigo, No Difficulty Walking,  Skin: No Rash or Lesions. Psych: No Change in Mood or Affect, No Depression or Anxiety, No Memory loss, No Confusion, or Hallucinations   Past Medical History  Diagnosis Date  . Asthma   . Anxiety   . GERD (gastroesophageal reflux disease)   . Headache(784.0)   . Arthritis   . Chronic back pain   . Hypertension   . Obstructive sleep apnea     On CPAP by nasal prong  . Thoracic aortic aneurysm [facility]) 12/2012     12/2012: 4.3 cm fusiform aneurysm-ascending thoracic aorta  . Palpitations 12/2012    ? SVT  . H/O dizziness   . Seasonal allergies   . Hx of migraines   . Knee pain, bilateral   . H/O shortness of breath      Past Surgical History  Procedure Laterality Date  . Abdominal hysterectomy    . Cholecystectomy      [facility]  . Back surgery      had staff infection of back  . Umbilical hernia repair    . Hernia repair  2009    inscional hernia repair x2-3  . Appendectomy      with gallbladder removal  . Cataract extraction w/phaco  01/16/2012    Procedure: CATARACT EXTRACTION PHACO AND INTRAOCULAR LENS PLACEMENT (IOC);  Surgeon: Elta Guadeloupe T. Gershon Crane, MD;  Location: [facility];  Service: Ophthalmology;  Laterality: Right;  CDE 6.12      Prior to Admission medications   Medication Sig Start Date End Date Taking? Authorizing Provider  amLODipine (NORVASC) 10 MG tablet TAKE ONE TABLET BY  MOUTH DAILY. 06/21/15   Herminio Commons, MD  aspirin 81 MG tablet Take 81 mg by mouth daily.    Historical Provider, MD  chlorthalidone (HYGROTON) 25 MG tablet Take 1 tablet (25 mg total) by mouth daily. 12/22/14   Herminio Commons, MD  isosorbide mononitrate (IMDUR) 30 MG 24 hr tablet Take 1 tablet (30 mg total) by mouth daily. 12/22/14   Herminio Commons, MD  lisinopril (PRINIVIL,ZESTRIL) 40 MG tablet Take 1 tablet (40 mg total) by mouth daily. 11/16/14   Herminio Commons, MD  metoprolol (LOPRESSOR) 100 MG tablet TAKE ONE TABLET BY MOUTH  TWICE DAILY. 04/19/15   Herminio Commons, MD  potassium chloride (K-DUR) 10 MEQ tablet Take 1 tablet (10 mEq total) by mouth 2 (two) times daily. 12/22/14   Herminio Commons, MD     Allergies  Allergen Reactions  . Vancomycin Cross Reactors Anaphylaxis    Social History:  reports that she has never smoked. She has never used smokeless tobacco. She reports that she does not drink alcohol or use illicit drugs.    Family History  Problem Relation Age of Onset  . Anesthesia problems Neg Hx   . Hypotension Neg Hx   . Pseudochol deficiency Neg Hx   . Malignant hyperthermia Neg Hx   . Hypertension Mother   . Diabetes Mother   . Hyperlipidemia Father     Also hypertension and an aneurysm       Physical Exam:  GEN:  Pleasant Obese 64 y.o. Caucasian female examined and in no acute distress; cooperative with exam Filed Vitals:   06/23/15 2215 06/23/15 2230 06/23/15 2245 06/24/15 0044  BP:  136/97  137/86  Pulse: 82 78 81 96  Temp:      TempSrc:      Resp:    18  Height:      Weight:      SpO2: 94% 91% 92% 97%   Blood pressure 137/86, pulse 96, temperature 98.6 F (37 C), temperature source Oral, resp. rate 18, height 4' 10"  (1.473 m), weight 116.574 kg (257 lb), SpO2 97 %. PSYCH: She is alert and oriented x4; does not appear anxious does not appear depressed; affect is normal HEENT: Normocephalic and Atraumatic, Mucous membranes pink; PERRLA; EOM intact; Fundi:  Benign;  No scleral icterus, Nares: Patent, Oropharynx: Clear, Poor Dentition,    Neck:  FROM, No Cervical Lymphadenopathy nor Thyromegaly or Carotid Bruit; No JVD; Breasts:: Not examined CHEST WALL: No tenderness CHEST: Normal respiration, clear to auscultation bilaterally HEART: Regular rate and rhythm; no murmurs rubs or gallops BACK: No kyphosis or scoliosis; No CVA tenderness ABDOMEN: Positive Bowel Sounds,  Obese, Soft Non-Tender, No Rebound or Guarding; No Masses, No Organomegaly, No Pannus; No Intertriginous  candida. Rectal Exam: Not done EXTREMITIES: No Cyanosis, Clubbing, 2+ BLE Edema , No Ulcerations. Genitalia: not examined PULSES: 2+ and symmetric SKIN: Normal hydration no rash or ulceration  CNS:  Alert and Oriented x 4, No Focal Deficits Mental Status:  Alert, Oriented, Thought Content Appropriate. Speech Fluent without evidence of Aphasia. Able to follow 3 step commands without difficulty.  In No obvious pain.   Cranial Nerves:  II: Discs flat bilaterally; Visual fields Intact. Pupils equal and reactive.    III,IV, VI: Extra-ocular motions intact bilaterally    V,VII: smile symmetric, facial light touch sensation normal bilaterally    VIII: hearing intact bilaterally    IX,X: gag reflex present    XI: bilateral shoulder shrug  XII: midline tongue extension   Motor:  Right:  Upper extremity 5/5     Left:  Upper extremity 5/5     Right:  Lower extremity 5/5    Left:  Lower extremity 5/5     Tone and Bulk:  normal tone throughout; no atrophy noted   Sensory:  Pinprick and light touch intact throughout, bilaterally   Deep Tendon Reflexes: 2+ and symmetric throughout   Plantars/ Babinski:  Right:  normal Left: normal    Cerebellar:  Finger to nose without difficulty.   Gait: deferred    Vascular: pulses palpable throughout    Labs on Admission:  Basic Metabolic Panel:  Recent Labs Lab 06/23/15 2340  NA 141  K 3.9  CL 107  CO2 27  GLUCOSE 90  BUN 16  CREATININE 1.01*  CALCIUM 8.6*   Liver Function Tests:  Recent Labs Lab 06/23/15 2340  AST 21  ALT 5*  ALKPHOS 66  BILITOT 0.5  PROT 7.2  ALBUMIN 3.7   No results for input(s): LIPASE, AMYLASE in the last 168 hours. No results for input(s): AMMONIA in the last 168 hours. CBC:  Recent Labs Lab 06/23/15 2300  WBC 8.4  NEUTROABS 4.0  HGB 13.7  HCT 42.4  MCV 96.1  PLT 168   Cardiac Enzymes:  Recent Labs Lab 06/23/15 2340  TROPONINI <0.03    BNP (last 3 results) No results for input(s): BNP  in the last 8760 hours.  ProBNP (last 3 results) No results for input(s): PROBNP in the last 8760 hours.  CBG: No results for input(s): GLUCAP in the last 168 hours.  Radiological Exams on Admission: Dg Chest 2 View  06/23/2015   CLINICAL DATA:  Moderate intermittent chest pain with associated bilateral lower extremity swelling and generalized weakness, onset several months ago.  EXAM: CHEST  2 VIEW  COMPARISON:  12/09/2012  FINDINGS: There is mild unchanged right hemidiaphragm elevation the lungs are clear. There is no pleural effusion. Hilar and mediastinal contours are unchanged and remarkable only for moderate aortic tortuosity.  IMPRESSION: No acute cardiopulmonary findings.   Electronically Signed   By: Andreas Newport M.D.   On: 06/23/2015 23:51   Ct Head Wo Contrast  06/24/2015   CLINICAL DATA:  Slurred speech and generalized weakness.  EXAM: CT HEAD WITHOUT CONTRAST  TECHNIQUE: Contiguous axial images were obtained from the base of the skull through the vertex without intravenous contrast.  COMPARISON:  None.  FINDINGS: There is no intracranial hemorrhage, mass or evidence of acute infarction. There is slight generalized atrophy. There is mild chronic microvascular ischemic change, particularly in the left posterior frontal white matter where there may be chronic white matter infarction. There is no significant extra-axial fluid collection.  No acute intracranial findings are evident.  IMPRESSION: Slight atrophy and mild chronic white matter changes, likely small vessel disease. No acute findings.   Electronically Signed   By: Andreas Newport M.D.   On: 06/24/2015 02:05   Ct Angio Chest Aorta W/cm &/or Wo/cm  06/24/2015   CLINICAL DATA:  Chronic moderate intermittent chest pain, with bilateral leg swelling, slurred speech, generalized weakness and leg pain. Nausea. Initial encounter.  EXAM: CT ANGIOGRAPHY CHEST, ABDOMEN AND PELVIS  TECHNIQUE: Multidetector CT imaging through the chest,  abdomen and pelvis was performed using the standard protocol during bolus administration of intravenous contrast. Multiplanar reconstructed images and MIPs were obtained and reviewed to evaluate the vascular anatomy.  CONTRAST:  19m OMNIPAQUE IOHEXOL 350 MG/ML SOLN  COMPARISON:  Chest radiograph performed 06/23/2015, and CTA of the chest performed 03/17/2015. CT of the abdomen and pelvis performed 09/30/2004  FINDINGS: CTA CHEST FINDINGS  There is no evidence of aortic dissection. Mild aneurysmal dilatation of the ascending thoracic aorta is only minimally changed from the prior study, measuring up to 4.4 cm in AP dimension. No calcific atherosclerotic disease is seen. There is no evidence of luminal narrowing. The great vessels are grossly unremarkable in appearance.  There is no evidence of central pulmonary embolus.  Mild right basilar atelectasis is noted. The lungs are otherwise clear. There is no evidence of significant focal consolidation, pleural effusion or pneumothorax. No masses are identified; no abnormal focal contrast enhancement is seen.  The mediastinum is unremarkable in appearance. No mediastinal lymphadenopathy is seen. No pericardial effusion is identified. No axillary lymphadenopathy is seen. The visualized portions of the thyroid gland are unremarkable in appearance.  The visualized portions of the liver and spleen are unremarkable. The visualized portions of the pancreas, gallbladder, stomach, adrenal glands and kidneys are within normal limits.  No acute osseous abnormalities are seen.  Review of the MIP images confirms the above findings.  CTA ABDOMEN AND PELVIS FINDINGS  The abdominal aorta is difficult to fully assess due to the timing of the contrast bolus. Much of the abdominal aorta has not yet opacified with contrast. The celiac trunk, superior mesenteric artery, bilateral renal arteries and inferior mesenteric artery are thought to be patent, though not well characterized.  The  inferior vena cava is grossly unremarkable in appearance.  The liver and spleen are unremarkable in appearance. The gallbladder is within normal limits. The pancreas and adrenal glands are unremarkable.  The kidneys are unremarkable in appearance. There is no evidence of hydronephrosis. No renal or ureteral stones are seen. Mild nonspecific perinephric stranding is noted bilaterally.  No free fluid is identified. The small bowel is unremarkable in appearance. The stomach is within normal limits. No acute vascular abnormalities are seen.  The patient is status post appendectomy. The colon is unremarkable in appearance. There is mild bulging of the anterior abdominal wall, without a focal hernia.  The bladder is mildly distended and grossly unremarkable. The patient is status post hysterectomy. No suspicious adnexal masses are seen. No inguinal lymphadenopathy is seen.  No acute osseous abnormalities are identified. Vacuum phenomenon is noted at the lower lumbar spine.  Review of the MIP images confirms the above findings.  IMPRESSION: 1. No evidence for aortic dissection. 2. Mild aneurysmal dilatation of the ascending thoracic aorta is only minimally changed from the prior study, measuring up to 4.4 cm in AP dimension. Recommend annual imaging followup by CTA or MRA. This recommendation follows 2010 ACCF/AHA/AATS/ACR/ASA/SCA/SCAI/SIR/STS/SVM Guidelines for the Diagnosis and Management of Patients with Thoracic Aortic Disease. Circulation. 2010; 121: I347-Q259. 3. No evidence of central pulmonary embolus. 4. Mild right basilar atelectasis noted.  Lungs otherwise clear. 5. Limited evaluation of the abdominal vasculature due to limitations in timing. No focal abnormality seen.   Electronically Signed   By: Garald Balding M.D.   On: 06/24/2015 02:11   Ct Cta Abd/pel W/cm &/or W/o Cm  06/24/2015   CLINICAL DATA:  Chronic moderate intermittent chest pain, with bilateral leg swelling, slurred speech, generalized weakness  and leg pain. Nausea. Initial encounter.  EXAM: CT ANGIOGRAPHY CHEST, ABDOMEN AND PELVIS  TECHNIQUE: Multidetector CT imaging through the chest, abdomen and pelvis was performed using the standard protocol during bolus administration of intravenous contrast. Multiplanar reconstructed images  and MIPs were obtained and reviewed to evaluate the vascular anatomy.  CONTRAST:  173m OMNIPAQUE IOHEXOL 350 MG/ML SOLN  COMPARISON:  Chest radiograph performed 06/23/2015, and CTA of the chest performed 03/17/2015. CT of the abdomen and pelvis performed 09/30/2004  FINDINGS: CTA CHEST FINDINGS  There is no evidence of aortic dissection. Mild aneurysmal dilatation of the ascending thoracic aorta is only minimally changed from the prior study, measuring up to 4.4 cm in AP dimension. No calcific atherosclerotic disease is seen. There is no evidence of luminal narrowing. The great vessels are grossly unremarkable in appearance.  There is no evidence of central pulmonary embolus.  Mild right basilar atelectasis is noted. The lungs are otherwise clear. There is no evidence of significant focal consolidation, pleural effusion or pneumothorax. No masses are identified; no abnormal focal contrast enhancement is seen.  The mediastinum is unremarkable in appearance. No mediastinal lymphadenopathy is seen. No pericardial effusion is identified. No axillary lymphadenopathy is seen. The visualized portions of the thyroid gland are unremarkable in appearance.  The visualized portions of the liver and spleen are unremarkable. The visualized portions of the pancreas, gallbladder, stomach, adrenal glands and kidneys are within normal limits.  No acute osseous abnormalities are seen.  Review of the MIP images confirms the above findings.  CTA ABDOMEN AND PELVIS FINDINGS  The abdominal aorta is difficult to fully assess due to the timing of the contrast bolus. Much of the abdominal aorta has not yet opacified with contrast. The celiac trunk,  superior mesenteric artery, bilateral renal arteries and inferior mesenteric artery are thought to be patent, though not well characterized.  The inferior vena cava is grossly unremarkable in appearance.  The liver and spleen are unremarkable in appearance. The gallbladder is within normal limits. The pancreas and adrenal glands are unremarkable.  The kidneys are unremarkable in appearance. There is no evidence of hydronephrosis. No renal or ureteral stones are seen. Mild nonspecific perinephric stranding is noted bilaterally.  No free fluid is identified. The small bowel is unremarkable in appearance. The stomach is within normal limits. No acute vascular abnormalities are seen.  The patient is status post appendectomy. The colon is unremarkable in appearance. There is mild bulging of the anterior abdominal wall, without a focal hernia.  The bladder is mildly distended and grossly unremarkable. The patient is status post hysterectomy. No suspicious adnexal masses are seen. No inguinal lymphadenopathy is seen.  No acute osseous abnormalities are identified. Vacuum phenomenon is noted at the lower lumbar spine.  Review of the MIP images confirms the above findings.  IMPRESSION: 1. No evidence for aortic dissection. 2. Mild aneurysmal dilatation of the ascending thoracic aorta is only minimally changed from the prior study, measuring up to 4.4 cm in AP dimension. Recommend annual imaging followup by CTA or MRA. This recommendation follows 2010 ACCF/AHA/AATS/ACR/ASA/SCA/SCAI/SIR/STS/SVM Guidelines for the Diagnosis and Management of Patients with Thoracic Aortic Disease. Circulation. 2010; 121: [id number] 3. No evidence of central pulmonary embolus. 4. Mild right basilar atelectasis noted.  Lungs otherwise clear. 5. Limited evaluation of the abdominal vasculature due to limitations in timing. No focal abnormality seen.   Electronically Signed   By: JGarald BaldingM.D.   On: 06/24/2015 02:11     EKG: Independently  reviewed. Normal Sinus Rhythm Rate = 81  No Acute S-T changes   Assessment/Plan:       64y.o. female with  Principal Problem:   1.    Chest pain   Cardica monitoring  Cycle Troponins   Nitropaste, O2, ASA   2D ECHO in AM   Check Fasting Lipids.   Active Problems:   2.    TIA (transient ischemic attack)- CT Scan of Head Done and Pending   TIA Workup   MRI/MRA in AM   2D ECHO in AM   ASA Rx     3.    UTI (lower urinary tract infection)   Urine C+S   IV Rocephin     4.    Hypertension   On Metoprolol, Amlodipine, Hygroton, and Lisinopril  Rx   Monitor BPs     5.   Thoracic aortic aneurysm [facility])- CT scan of Chest and ABD /Pelvis done   Followed by Dr Lawson Fiscal      6.    DVT Prophylaxis   Lovenox    Code Status:     FULL CODE    Family Communication:   No Family Present    Disposition Plan:    Observation Status with Anticipated stay of 1-2 days and then return to Home  Time spent:  [facility] Pager [phone]   If [place] Please Contact the Day Rounding Team MD for [facility]  If 7PM-7AM, Please Contact Night-Floor Coverage  [email or web address] Password [email or web address] 06/24/2015, 2:16 AM     ADDENDUM:   Patient was seen and examined on 06/24/2015

## 2015-06-24 NOTE — Progress Notes (Signed)
PROGRESS NOTE  Sabrina Curry JOI:[id number] DOB: 07-Feb-1951 DOA: 06/23/2015 PCP: Neale Burly, MD  Summary: 5 yow presented to the ED with multiple complaints including intermittent chest pain several months but worse 10/5; welling both legs x2 days; intermittent slurred speech for several months including a recent episode; lower abdominal pain. chest pain. CXR had no acute cardiopulmonary findings and her CT head was unremarkable. CTA of the chest, abdomen and pelvis revealed a mild aneurysmal dilatation of the ascending thoracic aorta and mild right basilar atelectasis with no evidence of PE. Labs were unremarkable and vitals were consistent with hypotension and hypoxia. Patient also reported intermittent episodes of slurred speech over the last several months. She was admitted for chest pain evaluation, possible TIA, UTI  Assessment/Plan: 1. Chest pain. Resolved. Serial troponins negative thus far. Maybe related to GERD. CTA of the chest, abdomen and pelvis showed mild aneurysmal dilatation without evidence of dissection or significant change since last study; no PE. and mild right basilar atelectasis. ECHO performed, results are pending. 2. Slurred speech waxing/waning, chronic, none today. TIA considered however given chronicity of symptoms but seems less likely given chronicity. Symptoms don't suggest seizure. CT head, MRI brain without acute features. Moderate SVD MCA branch vessels. LDL 77. 3. UTI. Afebrile, WBC wnl. UC are pending.  4. Thoracic aortic aneurysm followed by Dr Lawson Fiscal, stable. CT scan of Chest and ABD /Pelvis consistent with  Mild aneurysmal dilatation of the ascending thoracic aorta.  5. Hypertension, stable.  6. OSA on CPAP 7. Anxiety    Overall improved. Currently pain free, troponin negative, no evidence of ACS  Will follow up 2D ECHO and carotid US. Will solicit neurology for further recommendations in regard to chronic speech changes.  Will change to oral  abx  Transfer upstairs. Likely home in the next 24 hours.  Code Status: Full DVT prophylaxis: Lovenox Family discussion: Discussed plan in detail. No further concerns at this time. Disposition Plan: Likely home in the next 24 hours.  Murray Hodgkins, MD  [facility]  Pager [phone] If 7PM-7AM, please contact night-coverage at [email or web address], password [facility] 06/24/2015, 7:00 AM  LOS: 0 days   Consultants:    Procedures:    Antibiotics:  Rocephin 10/6  Ceftin 10/6 >>  HPI/Subjective: Mild nausea. Bilateral leg weakness that has been chronic for weeks but greater on the right today.  Numbness in fingers intermittent for months. Some SOB. No chest pain. No vomiting.  Reports no slurred speech now, but has had slurred speech many times in last several months, sometimes lasting all day.   Objective: Filed Vitals:   06/24/15 0302 06/24/15 0400 06/24/15 0500 06/24/15 0600  BP: 126/69 107/60 104/56 106/61  Pulse:  70 75 74  Temp: 98.4 F (36.9 C)     TempSrc: Oral     Resp:  16 20 17   Height: 4' 10"  (1.473 m)     Weight: 116.3 kg (256 lb 6.3 oz)     SpO2: 97% 91% 92% 94%    Intake/Output Summary (Last 24 hours) at 06/24/15 0700 Last data filed at 06/24/15 0300  Gross per 24 hour  Intake      0 ml  Output    400 ml  Net   -400 ml     Filed Weights   06/23/15 2128 06/24/15 0302  Weight: 116.574 kg (257 lb) 116.3 kg (256 lb 6.3 oz)    Exam:    VSS, afebrile, not hypoxic General:  Appears comfortable, calm. Eyes: PERRL, normal  lids, irises ENT: grossly normal hearing, lips, tongue Neck: no LAD, masses, thyromegaly Cardiovascular: Regular rate and rhythm, no murmur, rub or gallop. 1+ bilateral lower extremity edema. Telemetry: Sinus rhythm, no arrhythmias  Respiratory: Clear to auscultation bilaterally, no wheezes, rales or rhonchi. Normal respiratory effort. Abdomen: soft, ntnd  Skin: no rash or induration  Musculoskeletal: grossly normal tone  bilateral upper and lower extremities  5/5 strength in all extremities  Psychiatric: grossly normal mood and affect, speech fluent and appropriate Neurologic: CN 2-12 are intact. No pronator drift. Normal finger to nose.  New data reviewed:  Troponin negative   BMP unremarkable LDL 77 Hgb stable 11.9, Platelets 144  Creatinine improved 0.93  WBC 7.4, unremarkable   Pertinent data since admission:  CXR No acute cardiopulmonary findings  CT ANGIOGRAPHY CHEST, ABDOMEN AND PELVIS 1. No evidence for aortic dissection. 2. Mild aneurysmal dilatation of the ascending thoracic aorta is only minimally changed from the prior study, measuring up to 4.4 cm in AP dimension. Recommend annual imaging followup by CTA or MRA. This recommendation follows 2010 ACCF/AHA/AATS/ACR/ASA/SCA/SCAI/SIR/STS/SVM Guidelines for the Diagnosis and Management of Patients with Thoracic Aortic Disease. Circulation. 2010; 121: W960-A540. 3. No evidence of central pulmonary embolus. 4. Mild right basilar atelectasis noted. Lungs otherwise clear. 5. Limited evaluation of the abdominal vasculature due to limitations in timing. No focal abnormality seen.  CT HEAD Slight atrophy and mild chronic white matter changes, likely small  vessel disease. No acute findings.  CXR NAD  EKG SR  MRA Head  IMPRESSION: 1. 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 2. Moderate distal small vessel disease in the MCA branch vessels, left greater than right. 3. Moderate PCA small vessel attenuation bilaterally.  MRI Brain IMPRESSION: 1. No acute intracranial abnormality. 2. 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 3. 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  Pending data:  UC  ECHO  Scheduled Meds: .  stroke: mapping our early stages of recovery book   Does not apply Once  . aspirin  325 mg Oral Daily  .  cefTRIAXone (ROCEPHIN)  IV  1 g Intravenous Q24H  . enoxaparin (LOVENOX) injection  40 mg Subcutaneous Q24H  . furosemide  40 mg Intravenous Once  . [START ON 06/25/2015] Influenza vac split quadrivalent PF  0.5 mL Intramuscular Tomorrow-1000  . lisinopril  40 mg Oral Daily  . metoprolol  100 mg Oral BID  . nitroGLYCERIN  0.5 inch Topical 4 times per day  . pantoprazole  40 mg Oral Daily  . sodium chloride  3 mL Intravenous Q12H  . sodium chloride       Continuous Infusions:   Principal Problem:   Chest pain Active Problems:   Hypertension   Obstructive sleep apnea   Thoracic aortic aneurysm (HCC)   TIA (transient ischemic attack)   UTI (lower urinary tract infection)   Difficulty speaking   Time spent 25 minutes   By signing my name below, I, Rosalie Doctor attest that this documentation has been prepared under the direction and in the presence of Murray Hodgkins, MD Electronically signed: Rosalie Doctor, Scribe.   06/24/2015  AM  I personally performed the services described in this documentation. All medical record entries made by the scribe were at my direction. I have reviewed the chart and agree that the record reflects my personal performance and is accurate and complete. Murray Hodgkins, MD

## 2015-06-24 NOTE — Care Management Note (Signed)
Case Management Note  Patient Details  Name: Sabrina Curry MRN: [id number] Date of Birth: 13-Sep-1951   Expected Discharge Date:    06/25/2015              Expected Discharge Plan:  [place]  In-House Referral:  NA  Discharge planning Services  CM Consult  Post Acute Care Choice:  Durable Medical Equipment, Home Health Choice offered to:  Patient  DME Arranged:  [facility] rolling DME Agency:  [place]:    [facility] Agency:     Status of Service:  In process, will continue to follow  Medicare Important Message Given:    Date Medicare IM Given:    Medicare IM give by:    Date Additional Medicare IM Given:    Additional Medicare Important Message give by:     If discussed at [place] of Stay Meetings, dates discussed:    Additional Comments: Pt admitted with CP/CVA workup. Pt is from home, lives with husband and mother. PT ind at baseline. Pt has no HH services or DME's prior to admission. PT has recommended RW. Pt has chosen [facility] for DME needs. Romualdo Bolk, made aware of referral and will obtain pt info from chart and deliver RW to pt room. Pt may benefit from [facility] RN at DC for f/u, will discuss with MD. Will cont to follow for DC planning.  Sherald Barge, RN 06/24/2015, 3:19 PM

## 2015-06-24 NOTE — Progress Notes (Signed)
Report called to L. Bullins, Therapist, sports. Patient to be transferred to 310 after ST evaluation complete.

## 2015-06-25 ENCOUNTER — Inpatient Hospital Stay (HOSPITAL_COMMUNITY)
Admit: 2015-06-25 | Discharge: 2015-06-25 | Disposition: A | Discharge status: Home / Self Care | Payer: Medicaid Other | Attending: Neurology | Admitting: Neurology

## 2015-06-25 DIAGNOSIS — R4781 Slurred speech: Secondary | ICD-10-CM

## 2015-06-25 DIAGNOSIS — R079 Chest pain, unspecified: Secondary | ICD-10-CM

## 2015-06-25 DIAGNOSIS — I10 Essential (primary) hypertension: Secondary | ICD-10-CM

## 2015-06-25 DIAGNOSIS — I712 Thoracic aortic aneurysm, without rupture: Secondary | ICD-10-CM

## 2015-06-25 DIAGNOSIS — N390 Urinary tract infection, site not specified: Secondary | ICD-10-CM

## 2015-06-25 LAB — VITAMIN B12: VITAMIN B 12: 162 pg/mL — AB (ref 180–914)

## 2015-06-25 LAB — C-REACTIVE PROTEIN

## 2015-06-25 LAB — TSH: TSH: 1.551 u[IU]/mL (ref 0.350–4.500)

## 2015-06-25 LAB — HEMOGLOBIN A1C
HEMOGLOBIN A1C: 6.1 % — AB (ref 4.8–5.6)
MEAN PLASMA GLUCOSE: 128 mg/dL

## 2015-06-25 MED ORDER — SULFAMETHOXAZOLE-TRIMETHOPRIM 800-160 MG PO TABS
1.0000 | ORAL_TABLET | Freq: Two times a day (BID) | ORAL | Status: DC
  Administered 2015-06-25: 1 via ORAL
  Filled 2015-06-25: qty 1

## 2015-06-25 MED ORDER — CYANOCOBALAMIN 1000 MCG/ML IJ SOLN
1000.0000 ug | Freq: Once | INTRAMUSCULAR | Status: AC
  Administered 2015-06-25: 1000 ug via INTRAMUSCULAR
  Filled 2015-06-25: qty 1

## 2015-06-25 MED ORDER — PANTOPRAZOLE SODIUM 40 MG PO TBEC: 40.0000 mg | DELAYED_RELEASE_TABLET | Freq: Every day | ORAL | Status: AC

## 2015-06-25 MED ORDER — ASPIRIN 81 MG PO TABS: 162.0000 mg | ORAL_TABLET | Freq: Every day | ORAL | Status: AC

## 2015-06-25 MED ORDER — SULFAMETHOXAZOLE-TRIMETHOPRIM 800-160 MG PO TABS: 1.0000 | ORAL_TABLET | Freq: Two times a day (BID) | ORAL | Status: DC

## 2015-06-25 MED ORDER — VITAMIN B-12 1000 MCG PO TABS: 1000.0000 ug | ORAL_TABLET | Freq: Every day | ORAL | Status: AC

## 2015-06-25 NOTE — Progress Notes (Signed)
Discussed with the patient and all questioned fully answered. She will call me if any problems arise. Discharge education included healthy lifestyle, new medications, preventing stroke.  Fritz Pickerel, RN

## 2015-06-25 NOTE — Discharge Summary (Signed)
Physician Discharge Summary  Sabrina Curry ZOX:[id number] DOB: Sep 04, 1951 DOA: 06/23/2015  PCP: Neale Burly, MD  Admit date: 06/23/2015 Discharge date: 06/25/2015  Time spent: 35 minutes  Recommendations for Outpatient Follow-up:  1. Follow up with PCP in 1-2 weeks.  2. Follow-up with neurology in 4 weeks 3. EEG done at the time of discharge, report pending  Discharge Diagnoses:  Principal Problem:   Chest pain, likely related to GERD Active Problems:   Hypertension   Obstructive sleep apnea   Thoracic aortic aneurysm ([place])   UTI (lower urinary tract infection)   Difficulty speaking   Slurred speech, likely related to urinary tract infection   Discharge Condition: Improved  Diet recommendation: Heart Healthy  [facility] Weights   06/23/15 2128 06/24/15 0302 06/24/15 1813  Weight: 116.574 kg (257 lb) 116.3 kg (256 lb 6.3 oz) 116.302 kg (256 lb 6.4 oz)    History of present illness:  64 yo female presented to the ED with multiple complaints including intermittent chest pain several months but worse 10/5; swelling both legs x2 days; intermittent slurred speech for several months including a recent episode; lower abdominal pain and chest pain. CXR had no acute cardiopulmonary findings and her CT head was unremarkable. CTA of the chest, abdomen and pelvis revealed a mild aneurysmal dilatation of the ascending thoracic aorta and mild right basilar atelectasis with no evidence of PE. Labs were unremarkable and vitals were consistent with hypotension and hypoxia. Patient also reported intermittent episodes of slurred speech over the last several months. She was admitted for chest pain evaluation, possible TIA, and a UTI.  Hospital Course:  64 y/o female presented with multiple complaints including intermittent chest pain, LE swelling, and intermittent slurred speech. Her chest pain resolved spontaneously. Serial troponins were negative and her EKG and CXR were unremarkable for any acute  disease. Upon further imaging, her CTA of the chest, abdomen, and pelvis revealed a mild aneurysmal dilation without evidence of dissection and without significant change from prior. It was negative for PE. ECHO results as below. Her pain may be GERD related.   Intermittent slurred speech over the last few months was not concerning for a TIA given the chronicity of symptoms. Patient had no episodes of slurred speech during course of admission. Head CT was unremarkable. MRI of the brain revealed a remote lacunar infarct in the right subinsular white matter and inferior lentiform nucleus, but no evidence of acute infarct. Neurology input appreciated. EEG has been done with report pending. Her neuro symptoms may be related to underlying UTI. She was treated with a course of Bactrim. Her LDL was 77. Carotid doppler showed no significant stenosis bilaterally. Will increase ASA to 154m daily per neurology recommendations.   1. UTI. Afebrile, WBC wnl. Continue empiric abx.  UC pending.  2. Thoracic aortic aneurysm followed by Dr VLawson Fiscal stable. CT scan of Chest and ABD /Pelvis consistent with Mild aneurysmal dilatation of the ascending thoracic aorta. Follow up with CVTS.  3. Hypertension, stable.  4. OSA on CPAP 5. Anxiety  Procedures:  ECHO - Left ventricle: The cavity size was normal. Wall thickness was normal. Systolic function was normal. The estimated ejection fraction was in the range of 60% to 65%. Wall motion was normal; there were no regional wall motion abnormalities. - Aortic valve: Mildly calcified annulus. Trileaflet. There was trivial regurgitation. - Mitral valve: Calcified annulus. There was mild regurgitation. - Left atrium: The atrium was mildly dilated. - Right atrium: Central venous pressure (est): 8 mm  Hg. - Tricuspid valve: There was trivial regurgitation. - Pulmonary arteries: PA peak pressure: 24 mm Hg (S). - Pericardium, extracardiac: There was no pericardial  effusion.  Consultations:  [facility]  OT  Discharge Exam: Filed Vitals:   06/25/15 0556  BP: 151/83  Pulse: 59  Temp: 98.6 F (37 C)  Resp: 18     General: NAD, looks comfortable  Cardiovascular: RRR, S1, S2   Respiratory: clear bilaterally, No wheezing, rales or rhonchi  Abdomen: soft, non tender, no distention , bowel sounds normal  Musculoskeletal: No edema b/l  Discharge Instructions    Current Discharge Medication List    CONTINUE these medications which have NOT CHANGED   Details  amLODipine (NORVASC) 10 MG tablet TAKE ONE TABLET BY MOUTH DAILY. Qty: 30 tablet, Refills: 11    aspirin 81 MG tablet Take 81 mg by mouth daily.    chlorthalidone (HYGROTON) 25 MG tablet Take 1 tablet (25 mg total) by mouth daily. Qty: 90 tablet, Refills: 3   Associated Diagnoses: Essential hypertension; Chest pain, unspecified chest pain type    isosorbide mononitrate (IMDUR) 30 MG 24 hr tablet Take 1 tablet (30 mg total) by mouth daily. Qty: 90 tablet, Refills: 3    lisinopril (PRINIVIL,ZESTRIL) 40 MG tablet Take 1 tablet (40 mg total) by mouth daily. Qty: 90 tablet, Refills: 3   Associated Diagnoses: Essential hypertension; Rash of face    metoprolol (LOPRESSOR) 100 MG tablet TAKE ONE TABLET BY MOUTH TWICE DAILY. Qty: 60 tablet, Refills: 3    potassium chloride (K-DUR) 10 MEQ tablet Take 1 tablet (10 mEq total) by mouth 2 (two) times daily. Qty: 180 tablet, Refills: 3   Associated Diagnoses: Essential hypertension       Allergies  Allergen Reactions  . Vancomycin Cross Reactors Anaphylaxis      The results of significant diagnostics from this hospitalization (including imaging, microbiology, ancillary and laboratory) are listed below for reference.    Significant Diagnostic Studies: Dg Chest 2 View  06/23/2015   CLINICAL DATA:  Moderate intermittent chest pain with associated bilateral lower extremity swelling and generalized weakness, onset several  months ago.  EXAM: CHEST  2 VIEW  COMPARISON:  12/09/2012  FINDINGS: There is mild unchanged right hemidiaphragm elevation the lungs are clear. There is no pleural effusion. Hilar and mediastinal contours are unchanged and remarkable only for moderate aortic tortuosity.  IMPRESSION: No acute cardiopulmonary findings.   Electronically Signed   By: Andreas Newport M.D.   On: 06/23/2015 23:51   Ct Head Wo Contrast  06/24/2015   CLINICAL DATA:  Slurred speech and generalized weakness.  EXAM: CT HEAD WITHOUT CONTRAST  TECHNIQUE: Contiguous axial images were obtained from the base of the skull through the vertex without intravenous contrast.  COMPARISON:  None.  FINDINGS: There is no intracranial hemorrhage, mass or evidence of acute infarction. There is slight generalized atrophy. There is mild chronic microvascular ischemic change, particularly in the left posterior frontal white matter where there may be chronic white matter infarction. There is no significant extra-axial fluid collection.  No acute intracranial findings are evident.  IMPRESSION: Slight atrophy and mild chronic white matter changes, likely small vessel disease. No acute findings.   Electronically Signed   By: Andreas Newport M.D.   On: 06/24/2015 02:05   Mr Jodene Nam Head Wo Contrast  06/24/2015   CLINICAL DATA:  Weakness and slurred speech for 1 day.  EXAM: MRA HEAD WITHOUT CONTRAST  TECHNIQUE: Angiographic images of the Circle  of Willis were obtained using MRA technique without intravenous contrast.  COMPARISON:  CT head without contrast from the same day.  FINDINGS: The internal carotid arteries are within normal limits from the high cervical segments through the ICA termini bilaterally. The A1 and M1 segments are normal. The anterior communicating artery is patent. The MCA bifurcations are intact. There is moderate stenosis of the superior right M2 division. Moderate stenosis is present in both major left MCA branches. Distal small vessel  attenuation is more prominent left than right.  The vertebral arteries are codominant. The right PICA origin is below the field of view. The right AICA is present. The left PICA is not visualized. The basilar artery is intact. Both posterior cerebral arteries originate from the basilar tip. Moderate distal small vessel attenuation is present.  IMPRESSION: 1. 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 2. Moderate distal small vessel disease in the MCA branch vessels, left greater than right. 3. Moderate PCA small vessel attenuation bilaterally.   Electronically Signed   By: San Morelle M.D.   On: 06/24/2015 08:00   Mr Brain Wo Contrast  06/24/2015   CLINICAL DATA:  Weakness and slurred speech. The examination had to be discontinued prior to completion due to claustrophobia.  EXAM: MRI HEAD WITHOUT CONTRAST  TECHNIQUE: Multiplanar, multiecho pulse sequences of the brain and surrounding structures were obtained without intravenous contrast.  COMPARISON:  CT head without contrast from the same day  FINDINGS: The diffusion-weighted images demonstrate the restricted diffusion. Periventricular and subcortical T2 changes bilaterally are greater than expected for age. A remote lacunar infarct is present in the right subinsular white matter and inferior lentiform nucleus.  Remote lacunar infarcts are present in the inferior cerebellum bilaterally and left paramedian pons. Remote ischemic changes are present in the thalami bilaterally.  Flow is present in the major intracranial arteries. A right lens replacement is noted. The globes and orbits are otherwise intact. Mild mucosal thickening is present in the left maxillary sinus. The remaining paranasal sinuses and the mastoid air cells are clear.  IMPRESSION: 1. No acute intracranial abnormality. 2. 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 3. 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   Electronically Signed   By: San Morelle M.D.   On: 06/24/2015 08:03   US Carotid Bilateral  06/24/2015   CLINICAL DATA:  64 year old female with a history of slurred speech.  Cardiovascular risk factors include hypertension, prior stroke/ TIA.  EXAM: BILATERAL CAROTID DUPLEX ULTRASOUND  TECHNIQUE: Pearline Cables scale imaging, color Doppler and duplex ultrasound were performed of bilateral carotid and vertebral arteries in the neck.  COMPARISON:  No prior duplex  FINDINGS: Criteria: Quantification of carotid stenosis is based on velocity parameters that correlate the residual internal carotid diameter with NASCET-based stenosis levels, using the diameter of the distal internal carotid lumen as the denominator for stenosis measurement.  The following velocity measurements were obtained:  RIGHT  ICA:  Systolic 52 cm/sec, Diastolic 18 cm/sec  CCA:  78 cm/sec  SYSTOLIC ICA/CCA RATIO:  0.7  ECA:  64 cm/sec  LEFT  ICA:  Systolic 72 cm/sec, Diastolic 24 cm/sec  CCA:  63 cm/sec  SYSTOLIC ICA/CCA RATIO:  1.1  ECA:  61 cm/sec  Right Brachial SBP: Not acquired  Left Brachial SBP: Not acquired  RIGHT CAROTID ARTERY: No significant calcified disease of the right common carotid artery. Intermediate waveform maintained. Heterogeneous plaque without significant  calcifications at the right carotid bifurcation. Low resistance waveform of the right ICA. No significant tortuosity.  RIGHT VERTEBRAL ARTERY: Antegrade flow with low resistance waveform.  LEFT CAROTID ARTERY: No significant calcified disease of the left common carotid artery. Intermediate waveform maintained. Heterogeneous plaque at the left carotid bifurcation without significant calcifications. Low resistance waveform of the left ICA.  LEFT VERTEBRAL ARTERY:  Antegrade flow with low resistance waveform.  IMPRESSION: Color duplex indicates minimal heterogeneous plaque, with no hemodynamically significant stenosis by duplex criteria in the  extracranial cerebrovascular circulation.  Signed,  Dulcy Fanny. Earleen Newport, DO  [facility]  [facility]   Electronically Signed   By: Corrie Mckusick D.O.   On: 06/24/2015 13:47   Ct Angio Chest Aorta W/cm &/or Wo/cm  06/24/2015   CLINICAL DATA:  Chronic moderate intermittent chest pain, with bilateral leg swelling, slurred speech, generalized weakness and leg pain. Nausea. Initial encounter.  EXAM: CT ANGIOGRAPHY CHEST, ABDOMEN AND PELVIS  TECHNIQUE: Multidetector CT imaging through the chest, abdomen and pelvis was performed using the standard protocol during bolus administration of intravenous contrast. Multiplanar reconstructed images and MIPs were obtained and reviewed to evaluate the vascular anatomy.  CONTRAST:  141m OMNIPAQUE IOHEXOL 350 MG/ML SOLN  COMPARISON:  Chest radiograph performed 06/23/2015, and CTA of the chest performed 03/17/2015. CT of the abdomen and pelvis performed 09/30/2004  FINDINGS: CTA CHEST FINDINGS  There is no evidence of aortic dissection. Mild aneurysmal dilatation of the ascending thoracic aorta is only minimally changed from the prior study, measuring up to 4.4 cm in AP dimension. No calcific atherosclerotic disease is seen. There is no evidence of luminal narrowing. The great vessels are grossly unremarkable in appearance.  There is no evidence of central pulmonary embolus.  Mild right basilar atelectasis is noted. The lungs are otherwise clear. There is no evidence of significant focal consolidation, pleural effusion or pneumothorax. No masses are identified; no abnormal focal contrast enhancement is seen.  The mediastinum is unremarkable in appearance. No mediastinal lymphadenopathy is seen. No pericardial effusion is identified. No axillary lymphadenopathy is seen. The visualized portions of the thyroid gland are unremarkable in appearance.  The visualized portions of the liver and spleen are unremarkable. The visualized portions of  the pancreas, gallbladder, stomach, adrenal glands and kidneys are within normal limits.  No acute osseous abnormalities are seen.  Review of the MIP images confirms the above findings.  CTA ABDOMEN AND PELVIS FINDINGS  The abdominal aorta is difficult to fully assess due to the timing of the contrast bolus. Much of the abdominal aorta has not yet opacified with contrast. The celiac trunk, superior mesenteric artery, bilateral renal arteries and inferior mesenteric artery are thought to be patent, though not well characterized.  The inferior vena cava is grossly unremarkable in appearance.  The liver and spleen are unremarkable in appearance. The gallbladder is within normal limits. The pancreas and adrenal glands are unremarkable.  The kidneys are unremarkable in appearance. There is no evidence of hydronephrosis. No renal or ureteral stones are seen. Mild nonspecific perinephric stranding is noted bilaterally.  No free fluid is identified. The small bowel is unremarkable in appearance. The stomach is within normal limits. No acute vascular abnormalities are seen.  The patient is status post appendectomy. The colon is unremarkable in appearance. There is mild bulging of the anterior abdominal wall, without a focal hernia.  The bladder is mildly distended and grossly unremarkable. The patient is status post hysterectomy. No suspicious adnexal  masses are seen. No inguinal lymphadenopathy is seen.  No acute osseous abnormalities are identified. Vacuum phenomenon is noted at the lower lumbar spine.  Review of the MIP images confirms the above findings.  IMPRESSION: 1. No evidence for aortic dissection. 2. Mild aneurysmal dilatation of the ascending thoracic aorta is only minimally changed from the prior study, measuring up to 4.4 cm in AP dimension. Recommend annual imaging followup by CTA or MRA. This recommendation follows 2010 ACCF/AHA/AATS/ACR/ASA/SCA/SCAI/SIR/STS/SVM Guidelines for the Diagnosis and Management of  Patients with Thoracic Aortic Disease. Circulation. 2010; 121: R443-X540. 3. No evidence of central pulmonary embolus. 4. Mild right basilar atelectasis noted.  Lungs otherwise clear. 5. Limited evaluation of the abdominal vasculature due to limitations in timing. No focal abnormality seen.   Electronically Signed   By: Garald Balding M.D.   On: 06/24/2015 02:11   Ct Cta Abd/pel W/cm &/or W/o Cm  06/24/2015   CLINICAL DATA:  Chronic moderate intermittent chest pain, with bilateral leg swelling, slurred speech, generalized weakness and leg pain. Nausea. Initial encounter.  EXAM: CT ANGIOGRAPHY CHEST, ABDOMEN AND PELVIS  TECHNIQUE: Multidetector CT imaging through the chest, abdomen and pelvis was performed using the standard protocol during bolus administration of intravenous contrast. Multiplanar reconstructed images and MIPs were obtained and reviewed to evaluate the vascular anatomy.  CONTRAST:  152m OMNIPAQUE IOHEXOL 350 MG/ML SOLN  COMPARISON:  Chest radiograph performed 06/23/2015, and CTA of the chest performed 03/17/2015. CT of the abdomen and pelvis performed 09/30/2004  FINDINGS: CTA CHEST FINDINGS  There is no evidence of aortic dissection. Mild aneurysmal dilatation of the ascending thoracic aorta is only minimally changed from the prior study, measuring up to 4.4 cm in AP dimension. No calcific atherosclerotic disease is seen. There is no evidence of luminal narrowing. The great vessels are grossly unremarkable in appearance.  There is no evidence of central pulmonary embolus.  Mild right basilar atelectasis is noted. The lungs are otherwise clear. There is no evidence of significant focal consolidation, pleural effusion or pneumothorax. No masses are identified; no abnormal focal contrast enhancement is seen.  The mediastinum is unremarkable in appearance. No mediastinal lymphadenopathy is seen. No pericardial effusion is identified. No axillary lymphadenopathy is seen. The visualized portions of  the thyroid gland are unremarkable in appearance.  The visualized portions of the liver and spleen are unremarkable. The visualized portions of the pancreas, gallbladder, stomach, adrenal glands and kidneys are within normal limits.  No acute osseous abnormalities are seen.  Review of the MIP images confirms the above findings.  CTA ABDOMEN AND PELVIS FINDINGS  The abdominal aorta is difficult to fully assess due to the timing of the contrast bolus. Much of the abdominal aorta has not yet opacified with contrast. The celiac trunk, superior mesenteric artery, bilateral renal arteries and inferior mesenteric artery are thought to be patent, though not well characterized.  The inferior vena cava is grossly unremarkable in appearance.  The liver and spleen are unremarkable in appearance. The gallbladder is within normal limits. The pancreas and adrenal glands are unremarkable.  The kidneys are unremarkable in appearance. There is no evidence of hydronephrosis. No renal or ureteral stones are seen. Mild nonspecific perinephric stranding is noted bilaterally.  No free fluid is identified. The small bowel is unremarkable in appearance. The stomach is within normal limits. No acute vascular abnormalities are seen.  The patient is status post appendectomy. The colon is unremarkable in appearance. There is mild bulging of the anterior abdominal wall, without  a focal hernia.  The bladder is mildly distended and grossly unremarkable. The patient is status post hysterectomy. No suspicious adnexal masses are seen. No inguinal lymphadenopathy is seen.  No acute osseous abnormalities are identified. Vacuum phenomenon is noted at the lower lumbar spine.  Review of the MIP images confirms the above findings.  IMPRESSION: 1. No evidence for aortic dissection. 2. Mild aneurysmal dilatation of the ascending thoracic aorta is only minimally changed from the prior study, measuring up to 4.4 cm in AP dimension. Recommend annual imaging  followup by CTA or MRA. This recommendation follows 2010 ACCF/AHA/AATS/ACR/ASA/SCA/SCAI/SIR/STS/SVM Guidelines for the Diagnosis and Management of Patients with Thoracic Aortic Disease. Circulation. 2010; 121: [id number]. 3. No evidence of central pulmonary embolus. 4. Mild right basilar atelectasis noted.  Lungs otherwise clear. 5. Limited evaluation of the abdominal vasculature due to limitations in timing. No focal abnormality seen.   Electronically Signed   By: Garald Balding M.D.   On: 06/24/2015 02:11     Labs: Basic Metabolic Panel:  Recent Labs Lab 06/23/15 2340 06/24/15 0404  NA 141 141  K 3.9 3.8  CL 107 106  CO2 27 29  GLUCOSE 90 107*  BUN 16 14  CREATININE 1.01* 0.93  CALCIUM 8.6* 8.5*   Liver Function Tests:  Recent Labs Lab 06/23/15 2340  AST 21  ALT 5*  ALKPHOS 66  BILITOT 0.5  PROT 7.2  ALBUMIN 3.7    CBC:  Recent Labs Lab 06/23/15 2300 06/24/15 0404  WBC 8.4 7.4  NEUTROABS 4.0  --   HGB 13.7 11.9*  HCT 42.4 37.8  MCV 96.1 95.7  PLT 168 144*   Cardiac Enzymes:  Recent Labs Lab 06/23/15 2340 06/24/15 0220 06/24/15 0840 06/24/15 1404  TROPONINI <0.03 <0.03 <0.03 <0.03     Signed:  Kathie Dike. MD [facility] 06/25/2015, 7:48 AM  By signing my name below, I, Rosalie Doctor, attest that this documentation has been prepared under the direction and in the presence of [facility]. MD Electronically Signed: Rosalie Doctor, Scribe. 06/25/2015 10:25am   I, Dr. Kathie Dike, personally performed the services described in this documentaiton. All medical record entries made by the scribe were at my direction and in my presence. I have reviewed the chart and agree that the record reflects my personal performance and is accurate and complete  Kathie Dike, MD, 06/25/2015 5:14 PM

## 2015-06-25 NOTE — Evaluation (Signed)
Occupational Therapy Evaluation Patient Details Name: Sabrina Curry MRN: [id number] DOB: [birth year] Today's Date: 06/25/2015    History of Present Illness HPI: Sabrina Curry is a 64 y.o. female with a history of Asthma, HTN, who presents to the ED with complaints of intermittent Chest pain over the past few months but was worse today described as sharp pain radiating into her Left Arm associated with SOB, Nausea and Lightheadedness but no Vomiting or Diaphoresis. She reports swelling of both of her legs over the past 2 days. She also reports having intermittent slurring of her speech as well for several months and has a episode during there day which was noticed by her daughter on a phone call. The episode resolved. She reports having lower ABD pain during the past 2 days and denies any diarrhea or dysuria and also denies any fevers or chills.    Clinical Impression   Pt sitting at EOB on OT arrival, awake and alert. Pt demonstrates BUE range of motion WNL, strength 4/5. Pt reports independence with basic ADL tasks, has difficulty with household chores at times. 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 They are trying to obtain assistance for caregiving tasks at this time. Pt demonstrates independence in bed mobility and ADL tasks this am. She should be able transition back home with no difficulties. Educated pt on energy conservation strategies and discussed prioritizing daily activities to reduce stress and fatigue. No further OT services required at this time.    Follow Up Recommendations  No OT follow up    Equipment Recommendations  None recommended by OT       Precautions / Restrictions Precautions Precautions: None Restrictions Weight Bearing Restrictions: No      Mobility Bed Mobility Overal bed mobility: Modified Independent                        ADL Overall ADL's : Modified independent Eating/Feeding: Independent                    Lower Body Dressing: Modified independent;Sitting/lateral leans                       Vision Vision Assessment?: No apparent visual deficits          Pertinent Vitals/Pain Pain Assessment: No/denies pain     Hand Dominance Right   Extremity/Trunk Assessment Upper Extremity Assessment Upper Extremity Assessment: Generalized weakness   Lower Extremity Assessment Lower Extremity Assessment: Defer to PT evaluation       Communication Communication Communication: Expressive difficulties (slurred speech)   Cognition Arousal/Alertness: Awake/alert Behavior During Therapy: WFL for tasks assessed/performed Overall Cognitive Status: Within Functional Limits for tasks assessed                                Home Living Family/patient expects to be discharged to:: Private residence Living Arrangements: Spouse/significant other Available Help at Discharge: Family;Available 24 hours/day               Bathroom Shower/Tub: Occupational psychologist: Standard     Home Equipment: Environmental consultant - 2 wheels;Cane - single point;Shower seat          Prior Functioning/Environment Level of Independence: Independent              End of Session    Activity Tolerance: Patient tolerated treatment well  Patient left: in bed;with call bell/phone within reach   Time: 0824-0840 OT Time Calculation (min): 16 min Charges:  OT General Charges $OT Visit: 1 Procedure OT Evaluation $Initial OT Evaluation Tier I: 1 Procedure  Guadelupe Sabin, OTR/L  [phone]  06/25/2015, 8:43 AM

## 2015-06-25 NOTE — Care Management Note (Signed)
Case Management Note  Patient Details  Name: Dezarai Prew MRN: [id number] Date of Birth: February 23, 1951  Expected Discharge Date:                  Expected Discharge Plan:  [place]  In-House Referral:  NA  Discharge planning Services  CM Consult  Post Acute Care Choice:  Durable Medical Equipment, Home Health Choice offered to:  Patient  DME Arranged:  [place] rolling DME Agency:  [place]:    [place]:     Status of Service:  Completed, signed off  Medicare Important Message Given:    Date Medicare IM Given:    Medicare IM give by:    Date Additional Medicare IM Given:    Additional Medicare Important Message give by:     If discussed at [place] of Stay Meetings, dates discussed:    Additional Comments: Anticipate DC home in next 24hrs with [facility] RN services through [facility], per pt choice. Romualdo Bolk, of [facility], made aware of DC plan and will obtain pt info from chart. Pt aware [facility] has 48 hours to make first visit. No further CM needs noted.  Sherald Barge, RN 06/25/2015, 3:02 PM

## 2015-06-25 NOTE — Progress Notes (Signed)
EEG Completed; Results Pending  

## 2015-06-26 LAB — RPR: RPR Ser Ql: NONREACTIVE

## 2015-06-27 LAB — HOMOCYSTEINE: HOMOCYSTEINE-NORM: 15.4 umol/L — AB (ref 0.0–15.0)

## 2015-06-28 NOTE — Procedures (Signed)
  [place] A. Merlene Laughter, MD     [email or web address]           HISTORY:  The patient presents with recurrent episodes of dysarthria and ataxia. This study is being done to evaluate for complex partial seizures as a cause of these episodes.   MEDICATIONS: Scheduled Meds: Continuous Infusions: PRN Meds:.  Prior to Admission medications   Medication Sig Start Date End Date Taking? Authorizing Provider  amLODipine (NORVASC) 10 MG tablet TAKE ONE TABLET BY MOUTH DAILY. 06/21/15   Herminio Commons, MD  aspirin 81 MG tablet Take 2 tablets (162 mg total) by mouth daily. 06/25/15   Kathie Dike, MD  chlorthalidone (HYGROTON) 25 MG tablet Take 1 tablet (25 mg total) by mouth daily. 12/22/14   Herminio Commons, MD  isosorbide mononitrate (IMDUR) 30 MG 24 hr tablet Take 1 tablet (30 mg total) by mouth daily. 12/22/14   Herminio Commons, MD  lisinopril (PRINIVIL,ZESTRIL) 40 MG tablet Take 1 tablet (40 mg total) by mouth daily. 11/16/14   Herminio Commons, MD  metoprolol (LOPRESSOR) 100 MG tablet TAKE ONE TABLET BY MOUTH TWICE DAILY. 04/19/15   Herminio Commons, MD  pantoprazole (PROTONIX) 40 MG tablet Take 1 tablet (40 mg total) by mouth daily. 06/25/15   Kathie Dike, MD  potassium chloride (K-DUR) 10 MEQ tablet Take 1 tablet (10 mEq total) by mouth 2 (two) times daily. 12/22/14   Herminio Commons, MD  sulfamethoxazole-trimethoprim (BACTRIM DS,SEPTRA DS) 800-160 MG tablet Take 1 tablet by mouth every 12 (twelve) hours. 06/25/15   Kathie Dike, MD  vitamin B-12 (CYANOCOBALAMIN) 1000 MCG tablet Take 1 tablet (1,000 mcg total) by mouth daily. 06/25/15   Kathie Dike, MD      ANALYSIS: A 16 channel recording using standard 10 20 measurements is conducted for 25 minutes. There is a well-formed posterior dominant rhythm of 10.5 Hz which attenuates with eye opening. There is beta activity observed in the frontal areas. Awake and drowsy activities are observed. Photic  stimulation and hyperventilation are not carried out. There are no focal or lateralized slowing observed. There is no epileptiform activity observed.     IMPRESSION:  This is a normal recording of the awake and drowsy states.        Antoney Biven A. Merlene Laughter, M.D.  Diplomate, Tax adviser of Psychiatry and Neurology ( Neurology).

## 2015-08-11 ENCOUNTER — Other Ambulatory Visit (HOSPITAL_COMMUNITY): Payer: Self-pay | Admitting: Respiratory Therapy

## 2015-08-11 DIAGNOSIS — I6990 Unspecified sequelae of unspecified cerebrovascular disease: Secondary | ICD-10-CM

## 2015-08-11 DIAGNOSIS — G4452 New daily persistent headache (NDPH): Secondary | ICD-10-CM

## 2015-08-11 DIAGNOSIS — I10 Essential (primary) hypertension: Secondary | ICD-10-CM

## 2015-08-11 DIAGNOSIS — G4733 Obstructive sleep apnea (adult) (pediatric): Secondary | ICD-10-CM

## 2015-08-11 DIAGNOSIS — R5383 Other fatigue: Secondary | ICD-10-CM

## 2015-10-25 ENCOUNTER — Telehealth (HOSPITAL_COMMUNITY): Payer: Self-pay

## 2015-10-25 NOTE — Telephone Encounter (Signed)
No transportation today R/S for 11-04-15.

## 2015-10-26 ENCOUNTER — Ambulatory Visit (HOSPITAL_COMMUNITY): Payer: Medicaid Other | Admitting: Speech Pathology

## 2015-11-04 ENCOUNTER — Ambulatory Visit (HOSPITAL_COMMUNITY): Payer: Medicaid Other | Attending: Neurology | Admitting: Speech Pathology

## 2015-11-04 DIAGNOSIS — I69922 Dysarthria following unspecified cerebrovascular disease: Secondary | ICD-10-CM | POA: Insufficient documentation

## 2015-11-04 DIAGNOSIS — I69319 Unspecified symptoms and signs involving cognitive functions following cerebral infarction: Secondary | ICD-10-CM | POA: Diagnosis present

## 2015-11-04 DIAGNOSIS — IMO0002 Reserved for concepts with insufficient information to code with codable children: Secondary | ICD-10-CM

## 2015-11-04 NOTE — Therapy (Signed)
[place], [place], [address] Phone: [phone]   Fax:  [phone]  Speech Language Pathology Evaluation  Patient Details  Name: Sabrina Curry MRN: [id number] Date of Birth: [birth year] Referring Provider: Phillips Odor  Encounter Date: 11/04/2015      End of Session - 11/04/15 1853    Visit Number 1   Number of Visits 12   Date for SLP Re-Evaluation 01/27/16   Authorization Type Medicaid   Authorization Time Period will request for 11/11/2015-01/27/2016   SLP Start Time 15   SLP Stop Time  1700   SLP Time Calculation (min) 60 min   Activity Tolerance Patient tolerated treatment well      Past Medical History  Diagnosis Date  . Asthma   . Anxiety   . GERD (gastroesophageal reflux disease)   . Headache(784.0)   . Arthritis   . Chronic back pain   . Hypertension   . Obstructive sleep apnea     On CPAP by nasal prong  . Thoracic aortic aneurysm [facility]) 12/2012     12/2012: 4.3 cm fusiform aneurysm-ascending thoracic aorta  . Palpitations 12/2012    ? SVT  . H/O dizziness   . Seasonal allergies   . Hx of migraines   . Knee pain, bilateral   . H/O shortness of breath     Past Surgical History  Procedure Laterality Date  . Abdominal hysterectomy    . Cholecystectomy      [facility]  . Back surgery      had staff infection of back  . Umbilical hernia repair    . Hernia repair  2009    inscional hernia repair x2-3  . Appendectomy      with gallbladder removal  . Cataract extraction w/phaco  01/16/2012    Procedure: CATARACT EXTRACTION PHACO AND INTRAOCULAR LENS PLACEMENT (IOC);  Surgeon: Elta Guadeloupe T. Gershon Crane, MD;  Location: [facility];  Service: Ophthalmology;  Laterality: Right;  CDE 6.12    There were no vitals filed for this visit.  Visit Diagnosis: Dysarthria due to cerebrovascular accident - Plan: SLP plan of care cert/re-cert  Cognitive deficits following cerebral infarction - Plan: SLP plan of  care cert/re-cert      Subjective Assessment - 11/04/15 1842    Subjective "I have trouble getting people to understand me."   Special Tests Informal cog-ling measures   Currently in Pain? No/denies            SLP Evaluation [facility] - 11/04/15 1842    SLP Visit Information   SLP Received On 11/04/15   Referring Provider Kofi [place]   Onset Date 06/23/2015   Medical Diagnosis s/p CVA   Subjective   Subjective "I have trouble getting people to understand me."   Patient/Family Stated Goal "My speech"   General Information   HPI [name] is a 65 yo female who was referred by A. Florene Glen, NP for Phillips Odor for speech therapy s/p CVA 06/23/2015. She never received skilled SLP therapy following her stroke and is bothered by her decreased speech intelligibility. She also reports memory difficulties since her stroke. She lives at home with her husband and her mother and is the primary caregiver for both. She does not drive and she has been on disability for several years.She finished the 9th grade of high school.   Behavioral/Cognition Alert and cooperative   Mobility Status Ambulatory   Prior Functional Status   Cognitive/Linguistic Baseline Within functional limits  per patient    Lives With Spouse   Education 9th grade   Vocation On disability   Cognition   Overall Cognitive Status Impaired/Different from baseline   Area of Impairment Memory   Memory Decreased short-term memory   Memory Impaired   Memory Impairment Retrieval deficit;Decreased recall of new information;Prospective memory;Decreased short term memory   Decreased Short Term Memory Verbal complex   Awareness Appears intact   Problem Solving Appears intact   Executive Function Self Monitoring  for speech   Self Monitoring Impaired   Self Monitoring Impairment Verbal complex   Auditory Comprehension   Overall Auditory Comprehension Appears within functional limits for tasks assessed   Yes/No Questions  Within Functional Limits   Commands Within Functional Limits   Conversation Complex   Interfering Components Working memory   EffectiveTechniques Repetition   Psychologist, counselling Within Function Limits   Reading Comprehension   Reading Status Within funtional limits   Expression   Primary Mode of Expression Verbal   Verbal Expression   Overall Verbal Expression Impaired   Initiation No impairment   Level of Generative/Spontaneous Verbalization Conversation   Repetition No impairment   Naming Impairment   Responsive 76-100% accurate   Confrontation 75-100% accurate   Convergent 75-100% accurate   Divergent 75-100% accurate   Pragmatics No impairment   Interfering Components Speech intelligibility   Non-Verbal Means of Communication Not applicable   Written Expression   Dominant Hand Right   Written Expression Not tested   Oral Motor/Sensory Function   Overall Oral Motor/Sensory Function Impaired   Labial ROM --  decreased coordination   Motor Speech   Overall Motor Speech Impaired   Respiration Impaired   Level of Impairment Phrase   Phonation Normal   Resonance Within functional limits   Articulation Impaired   Level of Impairment Phrase   Intelligibility Intelligibility reduced   Word 50-74% accurate   Phrase 50-74% accurate   Sentence 25-49% accurate   Conversation 25-49% accurate   Motor Planning Witnin functional limits   Motor Speech Errors Not applicable   Effective Techniques Slow rate;Pause;Pacing;Over-articulate   Phonation WFL   Standardized Assessments   Standardized Assessments  Other Assessment  portions of standardized assessments and other informal meas                         SLP Education - 11/04/15 1852    Education provided Yes   Education Details Provided pt with speech intelligibility strategies, word fnding strategies, and memory strategies; discussed plan of care   Person(s) Educated  Patient   Methods Explanation;Handout   Comprehension Verbalized understanding;Need further instruction          SLP Short Term Goals - 11/04/15 1902    SLP SHORT TERM GOAL #1   Title Pt will implement speech intelligibility strategies during 1:1 conversation with SLP with min cues to increase speech intelligibility to 90%   Baseline mod cues for 70% intelligible at conversation level   Time 12   Period Weeks   Status New   SLP SHORT TERM GOAL #2   Title Pt will increase speech intelligibility to 90% during oral reading of short paragraph (4-5 sentences) with min cues from SLP and use of strategies.   Baseline 50% paragraph   Time 12   Period Weeks   Status New   SLP SHORT TERM GOAL #3   Title Pt will complete functional memory activities with 90% acc  with min assist and use of strategies.   Baseline 66% acc   Time 12   Period Weeks   Status New   SLP SHORT TERM GOAL #4   Title Pt will be able to talk on the phone for 4-5 minutes and accurately relay information with 95% intelligibility with use of strategies.   Baseline Pt does not talk on the phone at this time   Time 12   Period Weeks   Status New          SLP Long Term Goals - 11/04/15 1909    SLP LONG TERM GOAL #1   Title Pt will increase speech intelligibility to [facility] for short conversations with use of strategies as needed.   Baseline mod impairment   Time 12   Period Weeks   Status New   SLP LONG TERM GOAL #2   Title Pt will use memory strategies in functional daily activities to increase recall of pertinant information   Baseline min impairment   Time [place] - 11/04/15 1854    Clinical Impression Statement Mrs. Horsey presents with mild cogntive deficits characterized by decreased immediate, working, short term, and prospective memory; mild/mod decreased speech intelligibility due to dysarthria from stroke in October. Pt is the primary caregiver for her husband and  her mother. She reports feeling very frustrated when she is asked to repeat herself because others do not understand her. She is unable to be understood on the telephone and has a strong desire to improve her speech intelligibility. SLP had difficulty understanding pt about 50% of the time during conversation due. Pt responded well to cues to decrease rate which improved speech intelligibility. Pt never received SLP therapy following her stroke in October and would benefit from skilled SLP therapy in outpatient setting to address current deficits from stroke and increase quality of life and independence. She may also benefit from OT/PT consult due to reports of decreased balance with near fall episodes and numbness in her right hand which precludes her from tying her shoes.    Speech Therapy Frequency 1x /week   Duration --  12 weeks   Treatment/Interventions SLP instruction and feedback;Compensatory techniques;Cognitive reorganization;Cueing hierarchy;Internal/external aids;Compensatory strategies;Patient/family education   Potential to Achieve Goals Good   SLP Home Exercise Plan Pt will be independent with HEP as assigned to facilitate carryover of treatment strategies in home and community environment   Consulted and Agree with Plan of Care Patient        Problem List Patient Active Problem List   Diagnosis Date Noted  . Chest pain 06/24/2015  . TIA (transient ischemic attack) 06/24/2015  . UTI (lower urinary tract infection) 06/24/2015  . Difficulty speaking   . Slurred speech   . Skin eruption 01/30/2013  . Obstructive sleep apnea   . Thoracic aortic aneurysm ([place]) 12/17/2012  . Paroxysmal supraventricular tachycardia-nonsustained 12/08/2012  . Morbid obesity ([place]) 12/08/2012  . Asthma, chronic 12/08/2012  . Hypertension 12/08/2012   Thank you,  Genene Churn, [place]  [facility] 11/04/2015, 7:15 PM  [place] [address] [place], [place], [address] Phone: [phone]   Fax:  [phone]  Name: Sabrina Curry MRN: [id number] Date of Birth: 1951-05-24

## 2015-11-14 ENCOUNTER — Ambulatory Visit: Payer: Medicaid Other | Attending: Neurology | Admitting: Sleep Medicine

## 2015-11-14 DIAGNOSIS — R5383 Other fatigue: Secondary | ICD-10-CM | POA: Insufficient documentation

## 2015-11-14 DIAGNOSIS — I6990 Unspecified sequelae of unspecified cerebrovascular disease: Secondary | ICD-10-CM | POA: Insufficient documentation

## 2015-11-14 DIAGNOSIS — I10 Essential (primary) hypertension: Secondary | ICD-10-CM | POA: Diagnosis not present

## 2015-11-14 DIAGNOSIS — G4452 New daily persistent headache (NDPH): Secondary | ICD-10-CM | POA: Diagnosis not present

## 2015-11-14 DIAGNOSIS — G4733 Obstructive sleep apnea (adult) (pediatric): Secondary | ICD-10-CM | POA: Insufficient documentation

## 2015-11-18 ENCOUNTER — Telehealth (HOSPITAL_COMMUNITY): Payer: Self-pay

## 2015-11-18 NOTE — Telephone Encounter (Signed)
Call patient to ste up appt and she donot want P T at this time.

## 2015-11-19 NOTE — Sleep Study (Signed)
[place] A. Merlene Laughter, MD     [email or web address]             NOCTURNAL POLYSOMNOGRAPHY   LOCATION: [facility]  Patient Name: Sabrina Curry, Sabrina Curry Date: 11/14/2015 Gender: Female D.O.B: 20-Oct-1950 Age (years): 11 Referring Provider: Not Available Height (inches): 58 Interpreting Physician: Phillips Odor MD, ABSM Weight (lbs): 256 RPSGT: Peak, Robert BMI: 53 MRN: [id number] Neck Size: 16.00 CLINICAL INFORMATION Sleep Study Type: NPSG Indication for sleep study: Hypertension Epworth Sleepiness Score: 6 SLEEP STUDY TECHNIQUE As per the AASM Manual for the Scoring of Sleep and Associated Events v2.3 (April 2016) with a hypopnea requiring 4% desaturations. The channels recorded and monitored were frontal, central and occipital EEG, electrooculogram (EOG), submentalis EMG (chin), nasal and oral airflow, thoracic and abdominal wall motion, anterior tibialis EMG, snore microphone, electrocardiogram, and pulse oximetry. MEDICATIONS Patient's medications include: N/A. Medications self-administered by patient during sleep study : No sleep medicine administered.   Current outpatient prescriptions:  .  amLODipine (NORVASC) 10 MG tablet, TAKE ONE TABLET BY MOUTH DAILY., Disp: 30 tablet, Rfl: 11 .  aspirin 81 MG tablet, Take 2 tablets (162 mg total) by mouth daily., Disp: 30 tablet, Rfl:  .  chlorthalidone (HYGROTON) 25 MG tablet, Take 1 tablet (25 mg total) by mouth daily., Disp: 90 tablet, Rfl: 3 .  isosorbide mononitrate (IMDUR) 30 MG 24 hr tablet, Take 1 tablet (30 mg total) by mouth daily., Disp: 90 tablet, Rfl: 3 .  lisinopril (PRINIVIL,ZESTRIL) 40 MG tablet, Take 1 tablet (40 mg total) by mouth daily., Disp: 90 tablet, Rfl: 3 .  metoprolol (LOPRESSOR) 100 MG tablet, TAKE ONE TABLET BY MOUTH TWICE DAILY., Disp: 60 tablet, Rfl: 3 .  pantoprazole (PROTONIX) 40 MG tablet, Take 1 tablet (40 mg total) by mouth daily., Disp: 30 tablet, Rfl: 1 .  potassium chloride (K-DUR)  10 MEQ tablet, Take 1 tablet (10 mEq total) by mouth 2 (two) times daily., Disp: 180 tablet, Rfl: 3 .  sulfamethoxazole-trimethoprim (BACTRIM DS,SEPTRA DS) 800-160 MG tablet, Take 1 tablet by mouth every 12 (twelve) hours., Disp: 12 tablet, Rfl: 0 .  vitamin B-12 (CYANOCOBALAMIN) 1000 MCG tablet, Take 1 tablet (1,000 mcg total) by mouth daily., Disp: 30 tablet, Rfl: 1  SLEEP ARCHITECTURE The study was initiated at 10:13:14 PM and ended at 4:41:10 AM. Sleep onset time was 6.1 minutes and the sleep efficiency was 76.2%. The total sleep time was 295.5 minutes. Stage REM latency was 261.0 minutes. The patient spent 10.32% of the night in stage N1 sleep, 66.16% in stage N2 sleep, 13.37% in stage N3 and 10.15% in REM. Alpha intrusion was absent. Supine sleep was 100.00%. RESPIRATORY PARAMETERS The overall apnea/hypopnea index (AHI) was 5.1 per hour. There were 1 total apneas, including 1 obstructive, 0 central and 0 mixed apneas. There were 24 hypopneas and 18 RERAs. The AHI during Stage REM sleep was 36.0 per hour. AHI while supine was 5.1 per hour. The mean oxygen saturation was 91.45%. The minimum SpO2 during sleep was 81.00%. Snoring was noted during this study. Oxygen was started after the first hour of recording due to persistent desaturation without obstructive events. The total minutes with the saturation less than 88% is 87 minutes. She was placed on 2 L nocturnal oxygen which brought a saturation up to the 90s. This likely mitigated the desaturations observed during the second half of the study where she had the obstructive events. CARDIAC DATA The 2 lead EKG demonstrated sinus rhythm. The mean heart rate was 73.29  beats per minute. Other EKG findings include: None. LEG MOVEMENT DATA The total PLMS were 0 with a resulting PLMS index of 0.00. Associated arousal with leg movement index was 0.0.    IMPRESSIONS - Mild to moderate obstructive sleep apnea worse during REM sleep. Positive  pressure treatment is not required. - Hypoventilation syndrome; total minutes with oxygen saturation less than 88% is 87 minutes. Two L nocturnal oxygen is recommended.    Delano Metz, MD Diplomate, American Board of Sleep Medicine.

## 2015-11-29 ENCOUNTER — Telehealth (HOSPITAL_COMMUNITY): Payer: Self-pay

## 2015-11-29 ENCOUNTER — Ambulatory Visit (HOSPITAL_COMMUNITY): Payer: Medicaid Other | Admitting: Speech Pathology

## 2015-11-29 NOTE — Telephone Encounter (Signed)
Patient had a appt mix up

## 2015-12-15 ENCOUNTER — Ambulatory Visit (HOSPITAL_COMMUNITY): Payer: Medicaid Other | Admitting: Speech Pathology

## 2015-12-15 ENCOUNTER — Other Ambulatory Visit: Payer: Self-pay | Admitting: Cardiovascular Disease

## 2015-12-23 ENCOUNTER — Ambulatory Visit (HOSPITAL_COMMUNITY): Payer: Medicaid Other | Admitting: Speech Pathology

## 2015-12-30 ENCOUNTER — Encounter (HOSPITAL_COMMUNITY): Payer: Self-pay | Admitting: Speech Pathology

## 2015-12-30 ENCOUNTER — Ambulatory Visit (HOSPITAL_COMMUNITY): Payer: Medicaid Other | Attending: Neurology | Admitting: Speech Pathology

## 2015-12-30 DIAGNOSIS — R41841 Cognitive communication deficit: Secondary | ICD-10-CM | POA: Diagnosis present

## 2015-12-30 DIAGNOSIS — R471 Dysarthria and anarthria: Secondary | ICD-10-CM | POA: Insufficient documentation

## 2015-12-30 NOTE — Therapy (Signed)
[place], [place], [address] Phone: [phone]   Fax:  [phone]  Speech Language Pathology Treatment  Patient Details  Name: Sabrina Curry MRN: [id number] Date of Birth: [birth year] Referring Provider: Phillips Curry  Encounter Date: 12/30/2015      End of Session - 12/30/15 1831    Visit Number 2   Number of Visits 12   Date for SLP Re-Evaluation 01/27/16   Authorization Type Medicaid   Authorization Time Period will request for 11/11/2015-01/27/2016   Authorization - Visit Number 1   Authorization - Number of Visits 12   SLP Start Time 1696   SLP Stop Time  1430   SLP Time Calculation (min) 44 min   Activity Tolerance Patient tolerated treatment well      Past Medical History  Diagnosis Date  . Asthma   . Anxiety   . GERD (gastroesophageal reflux disease)   . Headache(784.0)   . Arthritis   . Chronic back pain   . Hypertension   . Obstructive sleep apnea     On CPAP by nasal prong  . Thoracic aortic aneurysm [facility]) 12/2012     12/2012: 4.3 cm fusiform aneurysm-ascending thoracic aorta  . Palpitations 12/2012    ? SVT  . H/O dizziness   . Seasonal allergies   . Hx of migraines   . Knee pain, bilateral   . H/O shortness of breath     Past Surgical History  Procedure Laterality Date  . Abdominal hysterectomy    . Cholecystectomy      [facility]  . Back surgery      had staff infection of back  . Umbilical hernia repair    . Hernia repair  2009    inscional hernia repair x2-3  . Appendectomy      with gallbladder removal  . Cataract extraction w/phaco  01/16/2012    Procedure: CATARACT EXTRACTION PHACO AND INTRAOCULAR LENS PLACEMENT (IOC);  Surgeon: Sabrina Guadeloupe T. Gershon Crane, MD;  Location: [facility];  Service: Ophthalmology;  Laterality: Right;  CDE 6.12    There were no vitals filed for this visit.      Subjective Assessment - 12/30/15 1827    Subjective "I have good days and bad days."    Special Tests Informal cog-ling measures   Currently in Pain? No/denies               ADULT SLP TREATMENT - 12/30/15 1828    General Information   Behavior/Cognition Alert;Cooperative;Pleasant mood   Patient Positioning Upright in chair   Oral care provided N/Sabrina   HPI [name] is Sabrina 65 yo female who was referred by Sabrina. Florene Glen, NP for Sabrina Curry for speech therapy s/p CVA 06/23/2015. She never received skilled SLP therapy following her stroke and is bothered by her decreased speech intelligibility. She also reports memory difficulties since her stroke. She lives at home with her husband and her mother and is the primary caregiver for both. She does not drive and she has been on disability for several years.She finished the 9th grade of high school   Treatment Provided   Treatment provided Cognitive-Linquistic   Pain Assessment   Pain Assessment No/denies pain   Cognitive-Linquistic Treatment   Treatment focused on Dysarthria;Cognition;Patient/family/caregiver education   Skilled Treatment speech intelligibility strategies, memory strategies   Assessment / Recommendations / Plan   Plan Continue with current plan of care   Progression Toward Goals   Progression toward goals  Progressing toward goals            SLP Short Term Goals - 12/30/15 1843    SLP SHORT TERM GOAL #1   Title Pt will implement speech intelligibility strategies during 1:1 conversation with SLP with min cues to increase speech intelligibility to 90%   Baseline mod cues for 70% intelligible at conversation level   Time 12   Period Weeks   Status On-going   SLP SHORT TERM GOAL #2   Title Pt will increase speech intelligibility to 90% during oral reading of short paragraph (4-5 sentences) with min cues from SLP and use of strategies.   Baseline 50% paragraph   Time 12   Period Weeks   Status On-going   SLP SHORT TERM GOAL #3   Title Pt will complete functional memory activities with 90% acc with  min assist and use of strategies.   Baseline 66% acc   Time 12   Period Weeks   Status On-going   SLP SHORT TERM GOAL #4   Title Pt will be able to talk on the phone for 4-5 minutes and accurately relay information with 95% intelligibility with use of strategies.   Baseline Pt does not talk on the phone at this time   Time 12   Period Weeks   Status On-going          SLP Long Term Goals - 12/30/15 1843    SLP LONG TERM GOAL #1   Title Pt will increase speech intelligibility to [facility] for short conversations with use of strategies as needed.   Baseline mod impairment   Time 12   Period Weeks   Status On-going   SLP LONG TERM GOAL #2   Title Pt will use memory strategies in functional daily activities to increase recall of pertinant information   Baseline min impairment   Time 12   Period Weeks   Status On-going          Plan - 12/30/15 1832    Clinical Impression Statement Sabrina Curry apologized for missing previous appointment due to transportation difficulties. She reports that she feels like her speech is "the same" since her evaluation in mid February. She also reports "good days and bad days" regarding her speech intelligibility. She makes attempts to implement strategies when it is apparent that her conversation partner does not understand. SLP reviewed memory and speech intelligibility strategies and encouraged pt to focus on decreasing rate as most important factor. Pt required min/mod cues during conversational speech to decrease rate at times. She identified that she thought she was supposed to be using oxygen at night following Sabrina sleep study in early March, but had not heard from Sabrina Curry. Pt avoids talking on the phone due to fear of not being understood. SLP provided outline for pt to follow and had pt leave Sabrina voice mail at Sabrina Curry office. She did an excellent job when given min cues to decrease rate and only had difficulty stating her date of birth with clarity.  SLP also reported that she did not like the Cymbalta prescribed by neurology and wished to go back to Xanax. She emphasised that she does not feel depressed, she feels anxious. She was asked to share this with her doctor as well. Continue plan of care with focus on increasing speech intelligibility in conversations.   Speech Therapy Frequency 1x /week   Duration --  12 weeks   Treatment/Interventions SLP instruction and feedback;Compensatory techniques;Cognitive reorganization;Cueing hierarchy;Internal/external aids;Compensatory strategies;Patient/family  education   Potential to Achieve Goals Good   SLP Home Exercise Plan Pt will be independent with HEP as assigned to facilitate carryover of treatment strategies in home and community environment   Consulted and Agree with Plan of Care Patient      Patient will benefit from skilled therapeutic intervention in order to improve the following deficits and impairments:   Dysarthria and anarthria  Cognitive communication deficit    Problem List Patient Active Problem List   Diagnosis Date Noted  . Chest pain 06/24/2015  . TIA (transient ischemic attack) 06/24/2015  . UTI (lower urinary tract infection) 06/24/2015  . Difficulty speaking   . Slurred speech   . Skin eruption 01/30/2013  . Obstructive sleep apnea   . Thoracic aortic aneurysm ([place]) 12/17/2012  . Paroxysmal supraventricular tachycardia-nonsustained 12/08/2012  . Morbid obesity ([place]) 12/08/2012  . Asthma, chronic 12/08/2012  . Hypertension 12/08/2012   Thank you,  Genene Churn, [place]  [facility] 12/30/2015, 6:47 PM  [place] [address] [place], [place], [address] Phone: [phone]   Fax:  [phone]   Name: Sabrina Curry MRN: [id number] Date of Birth: [birth year]

## 2016-01-05 ENCOUNTER — Telehealth (HOSPITAL_COMMUNITY): Payer: Self-pay | Admitting: Speech Pathology

## 2016-01-05 ENCOUNTER — Ambulatory Visit (HOSPITAL_COMMUNITY): Payer: Medicaid Other | Admitting: Speech Pathology

## 2016-01-05 NOTE — Telephone Encounter (Signed)
Speech Pathology  Pt did not show for her 9:45 AM appointment. SLP left voice mail regarding missed appointment and asked that she call our clinic to confirm next appointment.  Thank you,  Genene Churn, CCC-SLP [phone]

## 2016-01-11 ENCOUNTER — Encounter: Payer: Self-pay | Admitting: Adult Health

## 2016-01-11 ENCOUNTER — Other Ambulatory Visit: Payer: Self-pay | Admitting: Neurology

## 2016-01-11 ENCOUNTER — Ambulatory Visit (INDEPENDENT_AMBULATORY_CARE_PROVIDER_SITE_OTHER): Payer: Medicaid Other | Admitting: Adult Health

## 2016-01-11 ENCOUNTER — Other Ambulatory Visit (HOSPITAL_COMMUNITY): Payer: Self-pay | Admitting: Neurology

## 2016-01-11 ENCOUNTER — Ambulatory Visit (HOSPITAL_COMMUNITY)
Admission: RE | Admit: 2016-01-11 | Discharge: 2016-01-11 | Disposition: A | Discharge status: Home / Self Care | Payer: Medicaid Other | Source: Ambulatory Visit | Attending: Neurology | Admitting: Neurology

## 2016-01-11 VITALS — BP 144/84 | HR 86 | Ht <= 58 in | Wt 248.0 lb

## 2016-01-11 DIAGNOSIS — M25561 Pain in right knee: Secondary | ICD-10-CM | POA: Diagnosis present

## 2016-01-11 DIAGNOSIS — M25861 Other specified joint disorders, right knee: Secondary | ICD-10-CM | POA: Insufficient documentation

## 2016-01-11 DIAGNOSIS — R609 Edema, unspecified: Secondary | ICD-10-CM

## 2016-01-11 DIAGNOSIS — M25461 Effusion, right knee: Secondary | ICD-10-CM | POA: Diagnosis present

## 2016-01-11 DIAGNOSIS — I10 Essential (primary) hypertension: Secondary | ICD-10-CM

## 2016-01-11 DIAGNOSIS — G458 Other transient cerebral ischemic attacks and related syndromes: Secondary | ICD-10-CM | POA: Diagnosis not present

## 2016-01-11 DIAGNOSIS — G4730 Sleep apnea, unspecified: Secondary | ICD-10-CM

## 2016-01-11 NOTE — Patient Instructions (Addendum)
Your physician recommends that you schedule a follow-up appointment in: 6 Weeks with Jory Sims, NP   Your physician recommends that you continue on your current medications as directed. Please refer to the Current Medication list given to you today.  We will order CPAP follow- up with Dr. Luan Pulling for management.   If you need a refill on your cardiac medications before your next appointment, please call your pharmacy.  Thank you for choosing [facility]!

## 2016-01-11 NOTE — Progress Notes (Signed)
Cardiology Office Note   Date:  01/11/2016   ID:  [name], Sabrina Curry 01-May-1951, MRN [id number]  PCP:  Rosita Fire, MD  Cardiologist: Woodroe Chen, NP   Chief Complaint  Patient presents with  . Hypertension      History of Present Illness: Sabrina Curry is a 65 y.o. female who presents for and post - hospitalization with history of  GERD, hypertension, and obstructive sleep apnea.the patient was diagnosed with a urinary tract infection, and also had some mild episodes of slurred speech during the admission with a head CT unremarkable, MRI of brain revealed remote lacunar infarct in the right subinsular white matter and inferior lentiform nucleus, but no evidence of acute infarct.    ECHO - Left ventricle: The cavity size was normal. Wall thickness was normal. Systolic function was normal. The estimated ejection fraction was in the range of 60% to 65%. Wall motion was normal; there were no regional wall motion abnormalities. - Aortic valve: Mildly calcified annulus. Trileaflet. There was trivial regurgitation. - Mitral valve: Calcified annulus. There was mild regurgitation. - Left atrium: The atrium was mildly dilated. - Right atrium: Central venous pressure (est): 8 mm Hg. - Tricuspid valve: There was trivial regurgitation. - Pulmonary arteries: PA peak pressure: 24 mm Hg (S). - Pericardium, extracardiac: There was no pericardial effusion.  She returned home on amlodipine aspirin isosorbide lisinopril and metoprolol. Since being discharged she saw primary care and she was placed on  chlorthalidone 25 mg daily.she states that her blood pressure is better. She also has a history of obstructive sleep apnea, was told this through a sleep study, but her primary care physician did not order CPAP. She has not been followed by a pulmonologist either.    Past Medical History  Diagnosis Date  . Asthma   . Anxiety   . GERD (gastroesophageal reflux disease)   .  Headache(784.0)   . Arthritis   . Chronic back pain   . Hypertension   . Obstructive sleep apnea     On CPAP by nasal prong  . Thoracic aortic aneurysm [facility]) 12/2012     12/2012: 4.3 cm fusiform aneurysm-ascending thoracic aorta  . Palpitations 12/2012    ? SVT  . H/O dizziness   . Seasonal allergies   . Hx of migraines   . Knee pain, bilateral   . H/O shortness of breath     Past Surgical History  Procedure Laterality Date  . Abdominal hysterectomy    . Cholecystectomy      [facility]  . Back surgery      had staff infection of back  . Umbilical hernia repair    . Hernia repair  2009    inscional hernia repair x2-3  . Appendectomy      with gallbladder removal  . Cataract extraction w/phaco  01/16/2012    Procedure: CATARACT EXTRACTION PHACO AND INTRAOCULAR LENS PLACEMENT (IOC);  Surgeon: Elta Guadeloupe T. Gershon Crane, MD;  Location: [facility];  Service: Ophthalmology;  Laterality: Right;  CDE 6.12     Current Outpatient Prescriptions  Medication Sig Dispense Refill  . amLODipine (NORVASC) 10 MG tablet TAKE ONE TABLET BY MOUTH DAILY. 30 tablet 11  . aspirin 81 MG tablet Take 2 tablets (162 mg total) by mouth daily. 30 tablet   . chlorthalidone (HYGROTON) 25 MG tablet Take 1 tablet (25 mg total) by mouth daily. 90 tablet 3  . isosorbide mononitrate (IMDUR) 30 MG 24 hr tablet TAKE ONE TABLET BY MOUTH ONCE DAILY.  30 tablet 3  . lisinopril (PRINIVIL,ZESTRIL) 40 MG tablet Take 1 tablet (40 mg total) by mouth daily. 90 tablet 3  . metoprolol (LOPRESSOR) 100 MG tablet TAKE ONE TABLET BY MOUTH TWICE DAILY. 60 tablet 3  . pantoprazole (PROTONIX) 40 MG tablet Take 1 tablet (40 mg total) by mouth daily. 30 tablet 1  . potassium chloride (K-DUR) 10 MEQ tablet TAKE ONE TABLET BY MOUTH TWICE DAILY. 60 tablet 3  . vitamin B-12 (CYANOCOBALAMIN) 1000 MCG tablet Take 1 tablet (1,000 mcg total) by mouth daily. 30 tablet 1   No current facility-administered medications for this visit.    Allergies:    Vancomycin cross reactors    Social History:  The patient  reports that she has never smoked. She has never used smokeless tobacco. She reports that she does not drink alcohol or use illicit drugs.   Family History:  The patient's family history includes Diabetes in her mother; Hyperlipidemia in her father; Hypertension in her mother. There is no history of Anesthesia problems, Hypotension, Pseudochol deficiency, or Malignant hyperthermia.    ROS: All other systems are reviewed and negative. Unless otherwise mentioned in H&P    PHYSICAL EXAM: VS:  BP 144/84 mmHg  Pulse 86  Ht 4' 10"  (1.473 m)  Wt 248 lb (112.492 kg)  BMI 51.85 kg/m2  SpO2 92% , BMI Body mass index is 51.85 kg/(m^2). GEN: Well nourished, well developed, in no acute distressobese HEENT: normal Neck: no JVD, carotid bruits, or masses Cardiac:RRR; no murmurs, rubs, or gallops,no edema  Respiratory: Clear to auscultation bilaterally, normal work of breathing GI: soft, nontender, nondistended, + BS MS: no deformity or atrophy Skin: warm and dry, no rash Neuro:  Strength and sensation are intact Psych: euthymic mood, full affect  Recent Labs: 06/23/2015: ALT 5* 06/24/2015: BUN 14; Creatinine, Ser 0.93; Hemoglobin 11.9*; Platelets 144*; Potassium 3.8; Sodium 141 06/25/2015: TSH 1.551    Lipid Panel    Component Value Date/Time   CHOL 128 06/24/2015 0404   TRIG 79 06/24/2015 0404   HDL 35* 06/24/2015 0404   CHOLHDL 3.7 06/24/2015 0404   VLDL 16 06/24/2015 0404   LDLCALC 77 06/24/2015 0404      Wt Readings from Last 3 Encounters:  01/11/16 248 lb (112.492 kg)  11/14/15 256 lb (116.121 kg)  06/24/15 256 lb 6.4 oz (116.302 kg)    ASSESSMENT AND PLAN:  1. Hypertension: on multiple medications for control. Most recently chlorthalidone was added. The patient has obstructive sleep apnea, but has not received her CPAP for primary care. We'll go ahead and order it for her but will not manage it. She is being referred  to Dr. Luan Pulling for CPAP management, and oxygenation parameters.  2. Chest pain: no evidence of ischemia her labs or EKG. Will not plan any invasive testing at this time as she is without complaint. We'll get her feeling better on CPAP and blood pressure control. She continues to be symptomatic, can consider a nuclear medicine study or right heart cath.  3. History of multiple TIAs with lacunar infarcts. She is to continue to follow with neurology. She is on aspirin. No further invasive testing at this time.  Current medicines are reviewed at length with the patient today.    Labs/ tests ordered today include:  No orders of the defined types were placed in this encounter.     Disposition:   FU with 6 weeks. Signed, Jory Sims, NP  01/11/2016 1:16 PM    [facility] [address], [place], [place] [address] Phone: [phone]; Fax: [phone]

## 2016-01-11 NOTE — Progress Notes (Signed)
Name: Sabrina Curry    DOB: [birth year]  Age: 65 y.o.  MR#: [id number]       PCP:  Rosita Fire, MD      Insurance: Payor: MEDICAID [place] / Plan: MEDICAID [place] ACCESS / Product Type: *No Product type* /   CC:    Chief Complaint  Patient presents with  . Hypertension    VS Filed Vitals:   01/11/16 1313  BP: 144/84  Pulse: 86  Height: 4' 10"  (1.473 m)  Weight: 248 lb (112.492 kg)  SpO2: 92%    Weights Current Weight  01/11/16 248 lb (112.492 kg)  11/14/15 256 lb (116.121 kg)  06/24/15 256 lb 6.4 oz (116.302 kg)    Blood Pressure  BP Readings from Last 3 Encounters:  01/11/16 144/84  06/25/15 148/72  03/17/15 194/110     Admit date:  (Not on file) Last encounter with RMR:  Visit date not found   Allergy Vancomycin cross reactors  Current Outpatient Prescriptions  Medication Sig Dispense Refill  . amLODipine (NORVASC) 10 MG tablet TAKE ONE TABLET BY MOUTH DAILY. 30 tablet 11  . aspirin 81 MG tablet Take 2 tablets (162 mg total) by mouth daily. 30 tablet   . chlorthalidone (HYGROTON) 25 MG tablet Take 1 tablet (25 mg total) by mouth daily. 90 tablet 3  . isosorbide mononitrate (IMDUR) 30 MG 24 hr tablet TAKE ONE TABLET BY MOUTH ONCE DAILY. 30 tablet 3  . lisinopril (PRINIVIL,ZESTRIL) 40 MG tablet Take 1 tablet (40 mg total) by mouth daily. 90 tablet 3  . metoprolol (LOPRESSOR) 100 MG tablet TAKE ONE TABLET BY MOUTH TWICE DAILY. 60 tablet 3  . pantoprazole (PROTONIX) 40 MG tablet Take 1 tablet (40 mg total) by mouth daily. 30 tablet 1  . potassium chloride (K-DUR) 10 MEQ tablet TAKE ONE TABLET BY MOUTH TWICE DAILY. 60 tablet 3  . TRAZODONE HCL PO Take by mouth.    . vitamin B-12 (CYANOCOBALAMIN) 1000 MCG tablet Take 1 tablet (1,000 mcg total) by mouth daily. 30 tablet 1   No current facility-administered medications for this visit.    Discontinued Meds:    Medications Discontinued During This Encounter  Medication Reason  . chlorthalidone (HYGROTON) 25 MG tablet  Error  . sulfamethoxazole-trimethoprim (BACTRIM DS,SEPTRA DS) 800-160 MG tablet Error    Patient Active Problem List   Diagnosis Date Noted  . Chest pain 06/24/2015  . TIA (transient ischemic attack) 06/24/2015  . UTI (lower urinary tract infection) 06/24/2015  . Difficulty speaking   . Slurred speech   . Skin eruption 01/30/2013  . Obstructive sleep apnea   . Thoracic aortic aneurysm ([place]) 12/17/2012  . Paroxysmal supraventricular tachycardia-nonsustained 12/08/2012  . Morbid obesity ([place]) 12/08/2012  . Asthma, chronic 12/08/2012  . Hypertension 12/08/2012    LABS    Component Value Date/Time   NA 141 06/24/2015 0404   NA 141 06/23/2015 2340   NA 142 02/26/2013 1437   K 3.8 06/24/2015 0404   K 3.9 06/23/2015 2340   K 4.2 02/26/2013 1437   CL 106 06/24/2015 0404   CL 107 06/23/2015 2340   CL 108 02/26/2013 1437   CO2 29 06/24/2015 0404   CO2 27 06/23/2015 2340   CO2 27 02/26/2013 1437   GLUCOSE 107* 06/24/2015 0404   GLUCOSE 90 06/23/2015 2340   GLUCOSE 93 02/26/2013 1437   BUN 14 06/24/2015 0404   BUN 16 06/23/2015 2340   BUN 16 03/12/2015 1137   CREATININE 0.93 06/24/2015 0404  CREATININE 1.01* 06/23/2015 2340   CREATININE 1.11* 03/12/2015 1137   CREATININE 1.30* 01/21/2014 0753   CREATININE 1.17* 02/26/2013 1437   CREATININE 0.85 12/08/2012 0446   CALCIUM 8.5* 06/24/2015 0404   CALCIUM 8.6* 06/23/2015 2340   CALCIUM 9.4 02/26/2013 1437   GFRNONAA >60 06/24/2015 0404   GFRNONAA 58* 06/23/2015 2340   GFRNONAA 74* 12/08/2012 0446   GFRAA >60 06/24/2015 0404   GFRAA >60 06/23/2015 2340   GFRAA 85* 12/08/2012 0446   CMP     Component Value Date/Time   NA 141 06/24/2015 0404   K 3.8 06/24/2015 0404   CL 106 06/24/2015 0404   CO2 29 06/24/2015 0404   GLUCOSE 107* 06/24/2015 0404   BUN 14 06/24/2015 0404   CREATININE 0.93 06/24/2015 0404   CREATININE 1.11* 03/12/2015 1137   CALCIUM 8.5* 06/24/2015 0404   PROT 7.2 06/23/2015 2340   ALBUMIN 3.7  06/23/2015 2340   AST 21 06/23/2015 2340   ALT 5* 06/23/2015 2340   ALKPHOS 66 06/23/2015 2340   BILITOT 0.5 06/23/2015 2340   GFRNONAA >60 06/24/2015 0404   GFRAA >60 06/24/2015 0404       Component Value Date/Time   WBC 7.4 06/24/2015 0404   WBC 8.4 06/23/2015 2300   WBC 7.3 12/08/2012 0446   HGB 11.9* 06/24/2015 0404   HGB 13.7 06/23/2015 2300   HGB 13.3 12/08/2012 0446   HCT 37.8 06/24/2015 0404   HCT 42.4 06/23/2015 2300   HCT 40.5 12/08/2012 0446   MCV 95.7 06/24/2015 0404   MCV 96.1 06/23/2015 2300   MCV 94.2 12/08/2012 0446    Lipid Panel     Component Value Date/Time   CHOL 128 06/24/2015 0404   TRIG 79 06/24/2015 0404   HDL 35* 06/24/2015 0404   CHOLHDL 3.7 06/24/2015 0404   VLDL 16 06/24/2015 0404   LDLCALC 77 06/24/2015 0404    ABG No results found for: PHART, PCO2ART, PO2ART, HCO3, TCO2, ACIDBASEDEF, O2SAT   Lab Results  Component Value Date   TSH 1.551 06/25/2015   BNP (last 3 results) No results for input(s): BNP in the last 8760 hours.  ProBNP (last 3 results) No results for input(s): PROBNP in the last 8760 hours.  Cardiac Panel (last 3 results) No results for input(s): CKTOTAL, CKMB, TROPONINI, RELINDX in the last 72 hours.  Iron/TIBC/Ferritin/ %Sat No results found for: IRON, TIBC, FERRITIN, IRONPCTSAT   EKG Orders placed or performed during the hospital encounter of 06/23/15  . EKG 12-Lead  . EKG 12-Lead  . EKG     Prior Assessment and Plan Problem List as of 01/11/2016      Cardiovascular and Mediastinum   Paroxysmal supraventricular tachycardia-nonsustained   Last Assessment & Plan 03/04/2013 Office Visit Written 03/04/2013  3:35 PM by Yehuda Savannah, MD    Symptoms not controlled with moderate dose beta blocker. Patient has a borderline first degree AV block and intermittent mild sinus bradycardia.  Low dose diltiazem will be added with monitoring of vital signs and periodic assessment of electrocardiographic intervals.       Hypertension   Last Assessment & Plan 03/04/2013 Office Visit Written 03/04/2013  3:34 PM by Yehuda Savannah, MD    Blood pressure control is adequate today, but suboptimal based upon patient's measurements. 24-hour ambulatory monitoring will be performed with subsequent adjustment of medication if necessary. Diltiazem will be added to her medical regime in an attempt to control tachycardia as well as hypertension.  Thoracic aortic aneurysm [facility])   TIA (transient ischemic attack)     Respiratory   Asthma, chronic   Last Assessment & Plan 03/04/2013 Office Visit Written 03/04/2013  3:33 PM by Yehuda Savannah, MD    No recent symptoms to suggest active asthma.      Obstructive sleep apnea     Musculoskeletal and Integument   Skin eruption   Last Assessment & Plan 01/30/2013 Office Visit Written 02/05/2013 10:25 PM by Yehuda Savannah, MD    Papular rash over the malar eminences. Due to its very restricted distribution, doubt that this is a drug rash, but furosemide will be discontinued for now with substitution of chlorthalidone.  Seborrhea, rosacea and atopic dermatitis are among the diagnostic possibilities. Patient advised to use an OTC steroid cream and to seek attention from a dermatologist if rash persists.         Genitourinary   UTI (lower urinary tract infection)     Other   Morbid obesity [facility])   Last Assessment & Plan 01/09/2013 Office Visit Written 01/09/2013  2:34 PM by Yehuda Savannah, MD    Weight loss and exercise advised.      Chest pain   Difficulty speaking   Slurred speech       Imaging: No results found.

## 2016-01-12 ENCOUNTER — Ambulatory Visit (HOSPITAL_COMMUNITY): Payer: Medicaid Other | Admitting: Speech Pathology

## 2016-01-12 ENCOUNTER — Telehealth (HOSPITAL_COMMUNITY): Payer: Self-pay | Admitting: Speech Pathology

## 2016-01-12 NOTE — Telephone Encounter (Signed)
Speech Pathology- Telephone call  Telephone call placed to Mrs. Ruhe due to second missed appointment. Message left on her cell phone to please call to confirm that she will attend her next appointment next Tuesday at 2:30 PM.  Thank you,  Genene Churn, [place] [phone]

## 2016-01-18 ENCOUNTER — Ambulatory Visit (HOSPITAL_COMMUNITY): Payer: Medicaid Other | Attending: Neurology | Admitting: Speech Pathology

## 2016-01-19 ENCOUNTER — Ambulatory Visit (HOSPITAL_COMMUNITY): Payer: Medicaid Other | Admitting: Speech Pathology

## 2016-01-25 ENCOUNTER — Ambulatory Visit (HOSPITAL_COMMUNITY): Payer: Medicaid Other | Admitting: Speech Pathology

## 2016-01-26 ENCOUNTER — Ambulatory Visit (HOSPITAL_COMMUNITY): Payer: Medicaid Other | Admitting: Speech Pathology

## 2016-02-06 ENCOUNTER — Emergency Department (HOSPITAL_COMMUNITY): Payer: Medicaid Other

## 2016-02-06 ENCOUNTER — Encounter (HOSPITAL_COMMUNITY): Payer: Self-pay | Admitting: Emergency Medicine

## 2016-02-06 ENCOUNTER — Inpatient Hospital Stay (HOSPITAL_COMMUNITY)
Admission: EM | Admit: 2016-02-06 | Discharge: 2016-02-09 | DRG: 190 | Disposition: A | Discharge status: Home / Self Care | Payer: Medicaid Other | Attending: Internal Medicine | Admitting: Internal Medicine

## 2016-02-06 DIAGNOSIS — K219 Gastro-esophageal reflux disease without esophagitis: Secondary | ICD-10-CM | POA: Diagnosis present

## 2016-02-06 DIAGNOSIS — E662 Morbid (severe) obesity with alveolar hypoventilation: Secondary | ICD-10-CM | POA: Diagnosis present

## 2016-02-06 DIAGNOSIS — J441 Chronic obstructive pulmonary disease with (acute) exacerbation: Principal | ICD-10-CM | POA: Diagnosis present

## 2016-02-06 DIAGNOSIS — G4733 Obstructive sleep apnea (adult) (pediatric): Secondary | ICD-10-CM | POA: Diagnosis not present

## 2016-02-06 DIAGNOSIS — R0902 Hypoxemia: Secondary | ICD-10-CM | POA: Diagnosis present

## 2016-02-06 DIAGNOSIS — J449 Chronic obstructive pulmonary disease, unspecified: Secondary | ICD-10-CM | POA: Diagnosis not present

## 2016-02-06 DIAGNOSIS — F419 Anxiety disorder, unspecified: Secondary | ICD-10-CM | POA: Diagnosis present

## 2016-02-06 DIAGNOSIS — J45901 Unspecified asthma with (acute) exacerbation: Secondary | ICD-10-CM | POA: Diagnosis present

## 2016-02-06 DIAGNOSIS — I471 Supraventricular tachycardia, unspecified: Secondary | ICD-10-CM | POA: Diagnosis present

## 2016-02-06 DIAGNOSIS — Z8673 Personal history of transient ischemic attack (TIA), and cerebral infarction without residual deficits: Secondary | ICD-10-CM

## 2016-02-06 DIAGNOSIS — M549 Dorsalgia, unspecified: Secondary | ICD-10-CM

## 2016-02-06 DIAGNOSIS — Z6841 Body Mass Index (BMI) 40.0 and over, adult: Secondary | ICD-10-CM

## 2016-02-06 DIAGNOSIS — G8929 Other chronic pain: Secondary | ICD-10-CM

## 2016-02-06 DIAGNOSIS — J9601 Acute respiratory failure with hypoxia: Secondary | ICD-10-CM | POA: Diagnosis not present

## 2016-02-06 DIAGNOSIS — I10 Essential (primary) hypertension: Secondary | ICD-10-CM | POA: Diagnosis present

## 2016-02-06 HISTORY — DX: Chronic obstructive pulmonary disease, unspecified: J44.9

## 2016-02-06 HISTORY — DX: Cerebral infarction, unspecified: I63.9

## 2016-02-06 LAB — BASIC METABOLIC PANEL
ANION GAP: 8 (ref 5–15)
BUN: 12 mg/dL (ref 6–20)
CHLORIDE: 104 mmol/L (ref 101–111)
CO2: 29 mmol/L (ref 22–32)
Calcium: 9.2 mg/dL (ref 8.9–10.3)
Creatinine, Ser: 1.16 mg/dL — ABNORMAL HIGH (ref 0.44–1.00)
GFR calc non Af Amer: 49 mL/min — ABNORMAL LOW (ref >60)
GFR, EST AFRICAN AMERICAN: 57 mL/min — AB (ref >60)
Glucose, Bld: 114 mg/dL — ABNORMAL HIGH (ref 65–99)
Potassium: 3.3 mmol/L — ABNORMAL LOW (ref 3.5–5.1)
Sodium: 141 mmol/L (ref 135–145)

## 2016-02-06 LAB — BRAIN NATRIURETIC PEPTIDE: B Natriuretic Peptide: 172 pg/mL — ABNORMAL HIGH (ref 0.0–100.0)

## 2016-02-06 LAB — CBC WITH DIFFERENTIAL/PLATELET
BASOS PCT: 0 %
Basophils Absolute: 0 10*3/uL (ref 0.0–0.1)
Eosinophils Absolute: 0.2 10*3/uL (ref 0.0–0.7)
Eosinophils Relative: 2 %
HEMATOCRIT: 42.2 % (ref 36.0–46.0)
Hemoglobin: 13.2 g/dL (ref 12.0–15.0)
LYMPHS ABS: 2.3 10*3/uL (ref 0.7–4.0)
Lymphocytes Relative: 23 %
MCH: 30.4 pg (ref 26.0–34.0)
MCHC: 31.3 g/dL (ref 30.0–36.0)
MCV: 97.2 fL (ref 78.0–100.0)
MONOS PCT: 3 %
Monocytes Absolute: 0.3 10*3/uL (ref 0.1–1.0)
NEUTROS ABS: 7.1 10*3/uL (ref 1.7–7.7)
Neutrophils Relative %: 72 %
Platelets: 174 10*3/uL (ref 150–400)
RBC: 4.34 MIL/uL (ref 3.87–5.11)
RDW: 13.5 % (ref 11.5–15.5)
WBC: 10 10*3/uL (ref 4.0–10.5)

## 2016-02-06 LAB — I-STAT TROPONIN, ED: Troponin i, poc: 0 ng/mL (ref 0.00–0.08)

## 2016-02-06 MED ORDER — ALBUTEROL SULFATE (2.5 MG/3ML) 0.083% IN NEBU: 2.5000 mg | INHALATION_SOLUTION | RESPIRATORY_TRACT | Status: DC | PRN

## 2016-02-06 MED ORDER — METHYLPREDNISOLONE SODIUM SUCC 125 MG IJ SOLR
80.0000 mg | Freq: Two times a day (BID) | INTRAMUSCULAR | Status: DC
  Administered 2016-02-07 – 2016-02-08 (×3): 80 mg via INTRAVENOUS
  Filled 2016-02-06 (×4): qty 2

## 2016-02-06 MED ORDER — ALBUTEROL SULFATE (2.5 MG/3ML) 0.083% IN NEBU
2.5000 mg | INHALATION_SOLUTION | Freq: Once | RESPIRATORY_TRACT | Status: AC
  Administered 2016-02-06: 2.5 mg via RESPIRATORY_TRACT
  Filled 2016-02-06: qty 3

## 2016-02-06 MED ORDER — METOPROLOL TARTRATE 50 MG PO TABS
100.0000 mg | ORAL_TABLET | Freq: Two times a day (BID) | ORAL | Status: DC
  Administered 2016-02-06 – 2016-02-09 (×5): 100 mg via ORAL
  Filled 2016-02-06 (×6): qty 2

## 2016-02-06 MED ORDER — SODIUM CHLORIDE 0.9 % IV SOLN: 250.0000 mL | INTRAVENOUS | Status: DC | PRN

## 2016-02-06 MED ORDER — ALBUTEROL SULFATE (2.5 MG/3ML) 0.083% IN NEBU
2.5000 mg | INHALATION_SOLUTION | Freq: Four times a day (QID) | RESPIRATORY_TRACT | Status: DC
  Administered 2016-02-06: 2.5 mg via RESPIRATORY_TRACT
  Filled 2016-02-06: qty 3

## 2016-02-06 MED ORDER — ALBUTEROL SULFATE (2.5 MG/3ML) 0.083% IN NEBU: 2.5000 mg | INHALATION_SOLUTION | RESPIRATORY_TRACT | Status: AC | PRN

## 2016-02-06 MED ORDER — PANTOPRAZOLE SODIUM 40 MG PO TBEC
40.0000 mg | DELAYED_RELEASE_TABLET | Freq: Every day | ORAL | Status: DC
  Administered 2016-02-07 – 2016-02-09 (×3): 40 mg via ORAL
  Filled 2016-02-06 (×3): qty 1

## 2016-02-06 MED ORDER — ISOSORBIDE MONONITRATE ER 60 MG PO TB24
30.0000 mg | ORAL_TABLET | Freq: Every day | ORAL | Status: DC
  Administered 2016-02-07 – 2016-02-09 (×3): 30 mg via ORAL
  Filled 2016-02-06 (×3): qty 1

## 2016-02-06 MED ORDER — ASPIRIN EC 81 MG PO TBEC
162.0000 mg | DELAYED_RELEASE_TABLET | Freq: Every day | ORAL | Status: DC
  Administered 2016-02-07 – 2016-02-09 (×3): 162 mg via ORAL
  Filled 2016-02-06 (×5): qty 2

## 2016-02-06 MED ORDER — IPRATROPIUM BROMIDE 0.02 % IN SOLN: 0.5000 mg | Freq: Once | RESPIRATORY_TRACT | Status: DC

## 2016-02-06 MED ORDER — OXYCODONE-ACETAMINOPHEN 5-325 MG PO TABS
1.0000 | ORAL_TABLET | Freq: Four times a day (QID) | ORAL | Status: DC | PRN
  Administered 2016-02-06 – 2016-02-08 (×5): 1 via ORAL
  Filled 2016-02-06 (×5): qty 1

## 2016-02-06 MED ORDER — SODIUM CHLORIDE 0.9% FLUSH: 3.0000 mL | INTRAVENOUS | Status: DC | PRN

## 2016-02-06 MED ORDER — AZITHROMYCIN 250 MG PO TABS
500.0000 mg | ORAL_TABLET | Freq: Every day | ORAL | Status: AC
  Administered 2016-02-06: 500 mg via ORAL
  Filled 2016-02-06: qty 2

## 2016-02-06 MED ORDER — ALBUTEROL SULFATE (2.5 MG/3ML) 0.083% IN NEBU: 5.0000 mg | INHALATION_SOLUTION | Freq: Once | RESPIRATORY_TRACT | Status: DC

## 2016-02-06 MED ORDER — AZITHROMYCIN 250 MG PO TABS
250.0000 mg | ORAL_TABLET | Freq: Every day | ORAL | Status: DC
  Administered 2016-02-07 – 2016-02-09 (×3): 250 mg via ORAL
  Filled 2016-02-06 (×3): qty 1

## 2016-02-06 MED ORDER — FUROSEMIDE 10 MG/ML IJ SOLN
20.0000 mg | Freq: Once | INTRAMUSCULAR | Status: AC
  Administered 2016-02-06: 20 mg via INTRAVENOUS
  Filled 2016-02-06: qty 2

## 2016-02-06 MED ORDER — AMLODIPINE BESYLATE 5 MG PO TABS
10.0000 mg | ORAL_TABLET | Freq: Every day | ORAL | Status: DC
  Administered 2016-02-07 – 2016-02-09 (×3): 10 mg via ORAL
  Filled 2016-02-06 (×3): qty 2

## 2016-02-06 MED ORDER — IPRATROPIUM-ALBUTEROL 0.5-2.5 (3) MG/3ML IN SOLN
3.0000 mL | Freq: Once | RESPIRATORY_TRACT | Status: AC
  Administered 2016-02-06: 3 mL via RESPIRATORY_TRACT
  Filled 2016-02-06: qty 3

## 2016-02-06 MED ORDER — LISINOPRIL 10 MG PO TABS
40.0000 mg | ORAL_TABLET | Freq: Every day | ORAL | Status: DC
  Administered 2016-02-07 – 2016-02-09 (×3): 40 mg via ORAL
  Filled 2016-02-06 (×3): qty 4

## 2016-02-06 MED ORDER — METHYLPREDNISOLONE SODIUM SUCC 125 MG IJ SOLR
125.0000 mg | Freq: Once | INTRAMUSCULAR | Status: AC
  Administered 2016-02-06: 125 mg via INTRAVENOUS
  Filled 2016-02-06: qty 2

## 2016-02-06 MED ORDER — ENOXAPARIN SODIUM 40 MG/0.4ML ~~LOC~~ SOLN
40.0000 mg | SUBCUTANEOUS | Status: DC
  Administered 2016-02-06 – 2016-02-08 (×3): 40 mg via SUBCUTANEOUS
  Filled 2016-02-06 (×2): qty 0.4

## 2016-02-06 MED ORDER — SODIUM CHLORIDE 0.9% FLUSH
3.0000 mL | Freq: Two times a day (BID) | INTRAVENOUS | Status: DC
  Administered 2016-02-06 – 2016-02-09 (×6): 3 mL via INTRAVENOUS

## 2016-02-06 MED ORDER — IPRATROPIUM BROMIDE 0.02 % IN SOLN
0.5000 mg | Freq: Four times a day (QID) | RESPIRATORY_TRACT | Status: DC
  Administered 2016-02-06: 0.5 mg via RESPIRATORY_TRACT
  Filled 2016-02-06: qty 2.5

## 2016-02-06 NOTE — ED Provider Notes (Signed)
CSN: [id number]     Arrival date & time 02/06/16  1604 History   First MD Initiated Contact with Patient 02/06/16 1607     Chief Complaint  Patient presents with  . Shortness of Breath     (Consider location/radiation/quality/duration/timing/severity/associated sxs/prior Treatment) HPI  Expand All Collapse All   Per EMS, pt drove up to their station c/o SOB. Oxygen was 86% on RA. Pt hx of COPD.[name] Kitchen Pt noted to have wheezing in all lung fields. Pt given 55m albuterol and 1285mSolumedrol PTA. Sats 94% upon arrival on 4L nasal cannula. Pt also reports falling a few days ago. Pt has bruise to left shoulder. C/o pain to posterior head after fall. AOx4. VSS.        Past Medical History  Diagnosis Date  . Asthma   . Anxiety   . GERD (gastroesophageal reflux disease)   . Headache(784.0)   . Arthritis   . Chronic back pain   . Hypertension   . Obstructive sleep apnea     On CPAP by nasal prong  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 Palpitations 12/2012    ? SVT  . H/O dizziness   . Seasonal allergies   . Hx of migraines   . Knee pain, bilateral   . H/O shortness of breath   . COPD [place]ch[place] obstructive pulmonary disease) [facility]  [facility]    last [facility]oke Oct 2016   Past Surgical History  Procedure Laterality Date  . Abdominal hysterectomy    . Cholecystectomy      [facility]  . Back surgery      had staff infection of back  . Umbilical hernia repair    . Hernia repair  2009    inscional hernia repair x2-3  . Appendectomy      with gallbladder removal  . Cataract extraction w/phaco  01/16/2012    Procedure: CATARACT EXTRACTION PHACO AND INTRAOCULAR LENS PLACEMENT (IOC);  Surgeon: MaElta Guadeloupe. ShGershon CraneMD;  Location: [facility];  Service: Ophthalmology;  Laterality: Right;  CDE 6.12   Family History  Problem Relation Age of Onset  . Anesthesia problems Neg Hx   . Hypotension Neg Hx   . Pseudochol deficiency  Neg Hx   . Malignant hyperthermia Neg Hx   . Hypertension Mother   . Diabetes Mother   . Hyperlipidemia Father     Also hypertension and an aneurysm   Social History  Substance Use Topics  . Smoking status: Never Smoker   . Smokeless tobacco: Never Used  . Alcohol Use: No   OB History    No data available     Review of Systems    Allergies  Vancomycin cross reactors  Home Medications   Prior to Admission medications   Medication Sig Start Date End Date Taking? Authorizing Provider  amLODipine (NORVASC) 10 MG tablet TAKE ONE TABLET BY MOUTH DAILY. 06/21/15  Yes SuHerminio CommonsMD  aspirin 81 MG tablet Take 2 tablets (162 mg total) by mouth daily. 06/25/15  Yes JeKathie DikeMD  chlorthalidone (HYGROTON) 25 MG tablet Take 1 tablet (25 mg total) by mouth daily. 12/22/14  Yes SuHerminio CommonsMD  isosorbide mononitrate (IMDUR) 30 MG 24 hr tablet TAKE ONE TABLET BY MOUTH ONCE DAILY. 12/15/15  Yes SuHerminio CommonsMD  lisinopril (PRINIVIL,ZESTRIL) 40 MG tablet Take 1 tablet (40 mg total) by mouth daily. 11/16/14  Yes  Herminio Commons, MD  metoprolol (LOPRESSOR) 100 MG tablet TAKE ONE TABLET BY MOUTH TWICE DAILY. 04/19/15  Yes Herminio Commons, MD  pantoprazole (PROTONIX) 40 MG tablet Take 1 tablet (40 mg total) by mouth daily. 06/25/15  Yes Kathie Dike, MD  potassium chloride (K-DUR) 10 MEQ tablet TAKE ONE TABLET BY MOUTH TWICE DAILY. 12/15/15  Yes Herminio Commons, MD  vitamin B-12 (CYANOCOBALAMIN) 1000 MCG tablet Take 1 tablet (1,000 mcg total) by mouth daily. 06/25/15  Yes Kathie Dike, MD   BP 125/76 mmHg  Pulse 105  Temp(Src) 98.4 F (36.9 C) (Oral)  Resp 21  Ht 4' 10"  (1.473 m)  Wt 250 lb (113.399 kg)  BMI 52.26 kg/m2  SpO2 94% Physical Exam  Constitutional: She is oriented to person, place, and time. She appears well-developed and well-nourished. No distress.  HENT:  Head: Normocephalic and atraumatic.  Eyes: Pupils are equal, round, and reactive  to light.  Neck: Normal range of motion.  Cardiovascular: Normal rate and intact distal pulses.   Pulmonary/Chest: She is in respiratory distress. She has wheezes.  Abdominal: Normal appearance. She exhibits no distension. There is no tenderness.  Musculoskeletal: Normal range of motion. She exhibits edema.  Neurological: She is alert and oriented to person, place, and time. No cranial nerve deficit.  Skin: Skin is warm and dry. No rash noted.  Psychiatric: She has a normal mood and affect. Her behavior is normal.  Nursing note and vitals reviewed.   ED Course  Procedures (including critical care time) Labs Review Labs Reviewed  BASIC METABOLIC PANEL - Abnormal; Notable for the following:    Potassium 3.3 (*)    Glucose, Bld 114 (*)    Creatinine, Ser 1.16 (*)    GFR calc non Af Amer 49 (*)    GFR calc Af Amer 57 (*)    All other components within normal limits  BRAIN NATRIURETIC PEPTIDE - Abnormal; Notable for the following:    B Natriuretic Peptide 172.0 (*)    All other components within normal limits  CBC WITH DIFFERENTIAL/PLATELET  Randolm Idol, ED    Imaging Review Dg Chest Portable 1 View  02/06/2016  CLINICAL DATA:  Shortness of breath.  Duration: Earlier today. EXAM: PORTABLE CHEST 1 VIEW COMPARISON:  06/24/2015 FINDINGS: The patient is rotated to the right on today's radiograph, reducing diagnostic sensitivity and specificity. Mildly elevated right hemidiaphragm with suspected atelectasis along the right hemidiaphragm. The left lung appears clear. Thoracic spondylosis noted. IMPRESSION: 1. Borderline cardiomegaly. 2. Mildly elevated right hemidiaphragm with suspected atelectasis along the right hemidiaphragm. 3. Thoracic spondylosis. Electronically Signed   By: Van Clines M.D.   On: 02/06/2016 16:39   I have personally reviewed and evaluated these images and lab results as part of my medical decision-making.   EKG Interpretation   Date/Time:  Sunday Feb 06 2016 16:11:48 EDT Ventricular Rate:  93 PR Interval:  214 QRS Duration: 90 QT Interval:  373 QTC Calculation: 464 R Axis:   51 Text Interpretation:  Sinus rhythm Borderline prolonged PR interval Low  voltage, precordial leads Probable anteroseptal infarct, old Borderline T  abnormalities, inferior leads Abnormal ekg Confirmed by Victormanuel Mclure  MD, Maedell Hedger  ([id number]) on 02/06/2016 4:17:50 PM      MDM   Final diagnoses:  Acute respiratory failure with hypoxia ([place])  COPD exacerbation (HCC)        Leonard Schwartz, MD 02/06/16 [id number]

## 2016-02-06 NOTE — H&P (Signed)
PCP:   FANTA,TESFAYE, MD   Chief Complaint:  Sob , wheezing  HPI: 65 yo female h/o OSA in the process of getting her cpap set up at home thru dr Merlene Laughter, morbid obesity, asthma, htn, CVA comes in with over one day of worsening sob, cough and wheezing.  Denies fevers.  No sick contacts.  No nasal congestion.  She does report her legs a little more swollen than usual.  No leg pain.  No chest pain.  Not on steroids or abx frequently.  Pt found to be hypoxic at 84% on RA and diffusely wheezing on arrival.  Pt given solumedrol and nebs and she is moving better air now.  Pt referred for admission for her hypoxia and asthma exacerbation.  Review of Systems:  Positive and negative as per HPI otherwise all other systems are negative  Past Medical History: Past Medical History  Diagnosis Date  . Asthma   . Anxiety   . GERD (gastroesophageal reflux disease)   . Headache(784.0)   . Arthritis   . Chronic back pain   . Hypertension   . Obstructive sleep apnea     On CPAP by nasal prong  . 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 Palpitations 12/2012    ? SVT  . H/O dizziness   . Seasonal allergies   . Hx of migraines   . Knee pain, bilateral   . H/O shortness of breath   . COPD (chronic obstructive pulmonary disease) ([place])   . Stroke [facility])     last stoke Oct 2016   Past Surgical History  Procedure Laterality Date  . Abdominal hysterectomy    . Cholecystectomy      [facility]  . Back surgery      had staff infection of back  . Umbilical hernia repair    . Hernia repair  2009    inscional hernia repair x2-3  . Appendectomy      with gallbladder removal  . Cataract extraction w/phaco  01/16/2012    Procedure: CATARACT EXTRACTION PHACO AND INTRAOCULAR LENS PLACEMENT (IOC);  Surgeon: Elta Guadeloupe T. Gershon Crane, MD;  Location: [facility];  Service: Ophthalmology;  Laterality: Right;  CDE 6.12    Medications: Prior to Admission  medications   Medication Sig Start Date End Date Taking? Authorizing Provider  amLODipine (NORVASC) 10 MG tablet TAKE ONE TABLET BY MOUTH DAILY. 06/21/15  Yes Herminio Commons, MD  aspirin 81 MG tablet Take 2 tablets (162 mg total) by mouth daily. 06/25/15  Yes Kathie Dike, MD  chlorthalidone (HYGROTON) 25 MG tablet Take 1 tablet (25 mg total) by mouth daily. 12/22/14  Yes Herminio Commons, MD  isosorbide mononitrate (IMDUR) 30 MG 24 hr tablet TAKE ONE TABLET BY MOUTH ONCE DAILY. 12/15/15  Yes Herminio Commons, MD  lisinopril (PRINIVIL,ZESTRIL) 40 MG tablet Take 1 tablet (40 mg total) by mouth daily. 11/16/14  Yes Herminio Commons, MD  metoprolol (LOPRESSOR) 100 MG tablet TAKE ONE TABLET BY MOUTH TWICE DAILY. 04/19/15  Yes Herminio Commons, MD  pantoprazole (PROTONIX) 40 MG tablet Take 1 tablet (40 mg total) by mouth daily. 06/25/15  Yes Kathie Dike, MD  potassium chloride (K-DUR) 10 MEQ tablet TAKE ONE TABLET BY MOUTH TWICE DAILY. 12/15/15  Yes Herminio Commons, MD  vitamin B-12 (CYANOCOBALAMIN) 1000 MCG tablet Take 1 tablet (1,000 mcg total) by mouth daily. 06/25/15  Yes Kathie Dike, MD    Allergies:  Allergies  Allergen Reactions  . Vancomycin Cross Reactors Anaphylaxis    Social History:  reports that she has never smoked. She has never used smokeless tobacco. She reports that she does not drink alcohol or use illicit drugs.  Family History: Family History  Problem Relation Age of Onset  . Anesthesia problems Neg Hx   . Hypotension Neg Hx   . Pseudochol deficiency Neg Hx   . Malignant hyperthermia Neg Hx   . Hypertension Mother   . Diabetes Mother   . Hyperlipidemia Father     Also hypertension and an aneurysm    Physical Exam: Filed Vitals:   02/06/16 1730 02/06/16 1800 02/06/16 1830 02/06/16 1900  BP: 139/66 128/83 127/68 125/76  Pulse: 93 105 106 105  Temp:      TempSrc:      Resp: 20 22 21 21   Height:      Weight:      SpO2: 96% 97% 93% 94%    General appearance: alert, cooperative and no distress Head: Normocephalic, without obvious abnormality, atraumatic Eyes: negative Nose: Nares normal. Septum midline. Mucosa normal. No drainage or sinus tenderness. Neck: no JVD and supple, symmetrical, trachea midline Lungs: wheezes bibasilar expiratory, no crackles/rales/rhonchi with good air movement Heart: regular rate and rhythm, S1, S2 normal, no murmur, click, rub or gallop Abdomen: soft, non-tender; bowel sounds normal; no masses,  no organomegaly Extremities: edema trace Pulses: 2+ and symmetric Skin: Skin color, texture, turgor normal. No rashes or lesions Neurologic: Grossly normal   Labs on Admission:   Recent Labs  02/06/16 1723  NA 141  K 3.3*  CL 104  CO2 29  GLUCOSE 114*  BUN 12  CREATININE 1.16*  CALCIUM 9.2    Recent Labs  02/06/16 1723  WBC 10.0  NEUTROABS 7.1  HGB 13.2  HCT 42.2  MCV 97.2  PLT 174    Radiological Exams on Admission: Dg Chest Portable 1 View  02/06/2016  CLINICAL DATA:  Shortness of breath.  Duration: Earlier today. EXAM: PORTABLE CHEST 1 VIEW COMPARISON:  06/24/2015 FINDINGS: The patient is rotated to the right on today's radiograph, reducing diagnostic sensitivity and specificity. Mildly elevated right hemidiaphragm with suspected atelectasis along the right hemidiaphragm. The left lung appears clear. Thoracic spondylosis noted. IMPRESSION: 1. Borderline cardiomegaly. 2. Mildly elevated right hemidiaphragm with suspected atelectasis along the right hemidiaphragm. 3. Thoracic spondylosis. Electronically Signed   By: Van Clines M.D.   On: 02/06/2016 16:39   ekg reviewed nsr with twi inf leads and qw ant leads, new changes c/w with old ekgs   Assessment/Plan  65 yo female with acute hypoxic respiratory failure secondary to asthma exacerbation  Principal Problem:   Acute respiratory failure with hypoxia ([place])- due to asthma.  See below, supplemental oxygen and wean as  tolerates  Active Problems:   Asthma exacerbation - nonsmoker, iv solumedrol.  freq nebs.  zpack.   Morbid obesity ([place])- noted   Hypertension- noted   Obstructive sleep apnea- in the process of getting cpap set up at home thru dr Merlene Laughter office   Hypoxia- due to asthma exac.  See above   ekg changes - check cardiac echo.  Stress testing can be done outpatient with pcp.   ?query component of chf - mild fluid retention in legs.  Check echo.  High risk for diastolic dysfunction.  Not on lasix at home.  Give lasix 34m iv once tonight.  Admit to medical bed.  Full code.  Jonell Krontz A 02/06/2016, 7:31  PM

## 2016-02-06 NOTE — ED Notes (Signed)
Per EMS, pt drove up to their station c/o SOB. Oxygen was 86% on RA. Pt hx of COPD and is supposed to wear oxygen at home, but has not been compliant. Pt noted to have wheezing in all lung fields. Pt given 8m albuterol and 1225mSolumedrol PTA. Sats 94% upon arrival on 4L nasal cannula. Pt also reports falling a few days ago. Pt has bruise to left shoulder. C/o pain to posterior head after fall. AOx4. VSS.

## 2016-02-06 NOTE — ED Notes (Signed)
EDP at bedside  

## 2016-02-06 NOTE — ED Notes (Signed)
X-ray at bedside

## 2016-02-07 ENCOUNTER — Observation Stay (HOSPITAL_BASED_OUTPATIENT_CLINIC_OR_DEPARTMENT_OTHER): Payer: Medicaid Other

## 2016-02-07 DIAGNOSIS — E662 Morbid (severe) obesity with alveolar hypoventilation: Secondary | ICD-10-CM | POA: Diagnosis present

## 2016-02-07 DIAGNOSIS — R9431 Abnormal electrocardiogram [ECG] [EKG]: Secondary | ICD-10-CM

## 2016-02-07 DIAGNOSIS — Z8673 Personal history of transient ischemic attack (TIA), and cerebral infarction without residual deficits: Secondary | ICD-10-CM | POA: Diagnosis not present

## 2016-02-07 DIAGNOSIS — I10 Essential (primary) hypertension: Secondary | ICD-10-CM | POA: Diagnosis present

## 2016-02-07 DIAGNOSIS — K219 Gastro-esophageal reflux disease without esophagitis: Secondary | ICD-10-CM | POA: Diagnosis present

## 2016-02-07 DIAGNOSIS — J9601 Acute respiratory failure with hypoxia: Secondary | ICD-10-CM | POA: Diagnosis present

## 2016-02-07 DIAGNOSIS — Z6841 Body Mass Index (BMI) 40.0 and over, adult: Secondary | ICD-10-CM | POA: Diagnosis not present

## 2016-02-07 DIAGNOSIS — R0602 Shortness of breath: Secondary | ICD-10-CM | POA: Diagnosis not present

## 2016-02-07 DIAGNOSIS — F419 Anxiety disorder, unspecified: Secondary | ICD-10-CM | POA: Diagnosis present

## 2016-02-07 DIAGNOSIS — I471 Supraventricular tachycardia: Secondary | ICD-10-CM | POA: Diagnosis present

## 2016-02-07 DIAGNOSIS — J441 Chronic obstructive pulmonary disease with (acute) exacerbation: Secondary | ICD-10-CM | POA: Diagnosis present

## 2016-02-07 DIAGNOSIS — J45901 Unspecified asthma with (acute) exacerbation: Secondary | ICD-10-CM | POA: Diagnosis present

## 2016-02-07 LAB — BASIC METABOLIC PANEL
Anion gap: 7 (ref 5–15)
BUN: 14 mg/dL (ref 6–20)
CALCIUM: 8.8 mg/dL — AB (ref 8.9–10.3)
CO2: 28 mmol/L (ref 22–32)
CREATININE: 1.04 mg/dL — AB (ref 0.44–1.00)
Chloride: 103 mmol/L (ref 101–111)
GFR calc Af Amer: >60 mL/min (ref >60)
GFR, EST NON AFRICAN AMERICAN: 56 mL/min — AB (ref >60)
GLUCOSE: 154 mg/dL — AB (ref 65–99)
Potassium: 3.9 mmol/L (ref 3.5–5.1)
SODIUM: 138 mmol/L (ref 135–145)

## 2016-02-07 LAB — ECHOCARDIOGRAM COMPLETE
HEIGHTINCHES: 58 in
WEIGHTICAEL: 4162.28 [oz_av]

## 2016-02-07 LAB — CBC
HEMATOCRIT: 38.1 % (ref 36.0–46.0)
Hemoglobin: 12.1 g/dL (ref 12.0–15.0)
MCH: 30.4 pg (ref 26.0–34.0)
MCHC: 31.8 g/dL (ref 30.0–36.0)
MCV: 95.7 fL (ref 78.0–100.0)
PLATELETS: 177 10*3/uL (ref 150–400)
RBC: 3.98 MIL/uL (ref 3.87–5.11)
RDW: 13.4 % (ref 11.5–15.5)
WBC: 10.4 10*3/uL (ref 4.0–10.5)

## 2016-02-07 MED ORDER — IPRATROPIUM-ALBUTEROL 0.5-2.5 (3) MG/3ML IN SOLN
3.0000 mL | Freq: Four times a day (QID) | RESPIRATORY_TRACT | Status: DC
  Administered 2016-02-07 – 2016-02-08 (×8): 3 mL via RESPIRATORY_TRACT
  Filled 2016-02-07 (×8): qty 3

## 2016-02-07 NOTE — Progress Notes (Signed)
Subjective: Patient was admitted yesterday due to worsening shortness of breath and wheezing. She is a known case of bronchial asthma and OSA. Patient feels slightly better.   Objective: Vital signs in last 24 hours: Temp:  [98 F (36.7 C)-98.4 F (36.9 C)] 98 F (36.7 C) (05/22 0601) Pulse Rate:  [70-108] 70 (05/22 0601) Resp:  [15-28] 18 (05/22 0601) BP: (115-161)/(66-94) 115/88 mmHg (05/22 0601) SpO2:  [91 %-98 %] 96 % (05/22 0601) Weight:  [113.399 kg (250 lb)-118 kg (260 lb 2.3 oz)] 118 kg (260 lb 2.3 oz) (05/21 2012) Weight change:  Last BM Date: 02/06/16  Intake/Output from previous day: 05/21 0701 - 05/22 0700 In: -  Out: 1100 [Urine:1100]  PHYSICAL EXAM General appearance: alert and no distress Resp: diminished breath sounds bilaterally and wheezes bilaterally Cardio: S1, S2 normal GI: soft, non-tender; bowel sounds normal; no masses,  no organomegaly Extremities: extremities normal, atraumatic, no cyanosis or edema  Lab Results:  Results for orders placed or performed during the hospital encounter of 02/06/16 (from the past 48 hour(s))  Basic metabolic panel     Status: Abnormal   Collection Time: 02/06/16  5:23 PM  Result Value Ref Range   Sodium 141 135 - 145 mmol/L   Potassium 3.3 (L) 3.5 - 5.1 mmol/L   Chloride 104 101 - 111 mmol/L   CO2 29 22 - 32 mmol/L   Glucose, Bld 114 (H) 65 - 99 mg/dL   BUN 12 6 - 20 mg/dL   Creatinine, Ser 1.16 (H) 0.44 - 1.00 mg/dL   Calcium 9.2 8.9 - 10.3 mg/dL   GFR calc non Af Amer 49 (L) >60 mL/min   GFR calc Af Amer 57 (L) >60 mL/min    Comment: (NOTE) The eGFR has been calculated using the CKD EPI equation. This calculation has not been validated in all clinical situations. eGFR's persistently <60 mL/min signify possible Chronic Kidney Disease.    Anion gap 8 5 - 15  CBC with Differential/Platelet     Status: None   Collection Time: 02/06/16  5:23 PM  Result Value Ref Range   WBC 10.0 4.0 - 10.5 K/uL   RBC 4.34 3.87  - 5.11 MIL/uL   Hemoglobin 13.2 12.0 - 15.0 g/dL   HCT 42.2 36.0 - 46.0 %   MCV 97.2 78.0 - 100.0 fL   MCH 30.4 26.0 - 34.0 pg   MCHC 31.3 30.0 - 36.0 g/dL   RDW 13.5 11.5 - 15.5 %   Platelets 174 150 - 400 K/uL   Neutrophils Relative % 72 %   Neutro Abs 7.1 1.7 - 7.7 K/uL   Lymphocytes Relative 23 %   Lymphs Abs 2.3 0.7 - 4.0 K/uL   Monocytes Relative 3 %   Monocytes Absolute 0.3 0.1 - 1.0 K/uL   Eosinophils Relative 2 %   Eosinophils Absolute 0.2 0.0 - 0.7 K/uL   Basophils Relative 0 %   Basophils Absolute 0.0 0.0 - 0.1 K/uL  Brain natriuretic peptide     Status: Abnormal   Collection Time: 02/06/16  5:23 PM  Result Value Ref Range   B Natriuretic Peptide 172.0 (H) 0.0 - 100.0 pg/mL  I-stat troponin, ED     Status: None   Collection Time: 02/06/16  5:30 PM  Result Value Ref Range   Troponin i, poc 0.00 0.00 - 0.08 ng/mL   Comment 3            Comment: Due to the release kinetics of cTnI,  a negative result within the first hours of the onset of symptoms does not rule out myocardial infarction with certainty. If myocardial infarction is still suspected, repeat the test at appropriate intervals.   Basic metabolic panel     Status: Abnormal   Collection Time: 02/07/16  5:26 AM  Result Value Ref Range   Sodium 138 135 - 145 mmol/L   Potassium 3.9 3.5 - 5.1 mmol/L   Chloride 103 101 - 111 mmol/L   CO2 28 22 - 32 mmol/L   Glucose, Bld 154 (H) 65 - 99 mg/dL   BUN 14 6 - 20 mg/dL   Creatinine, Ser 1.04 (H) 0.44 - 1.00 mg/dL   Calcium 8.8 (L) 8.9 - 10.3 mg/dL   GFR calc non Af Amer 56 (L) >60 mL/min   GFR calc Af Amer >60 >60 mL/min    Comment: (NOTE) The eGFR has been calculated using the CKD EPI equation. This calculation has not been validated in all clinical situations. eGFR's persistently <60 mL/min signify possible Chronic Kidney Disease.    Anion gap 7 5 - 15  CBC     Status: None   Collection Time: 02/07/16  5:26 AM  Result Value Ref Range   WBC 10.4 4.0 -  10.5 K/uL   RBC 3.98 3.87 - 5.11 MIL/uL   Hemoglobin 12.1 12.0 - 15.0 g/dL   HCT 38.1 36.0 - 46.0 %   MCV 95.7 78.0 - 100.0 fL   MCH 30.4 26.0 - 34.0 pg   MCHC 31.8 30.0 - 36.0 g/dL   RDW 13.4 11.5 - 15.5 %   Platelets 177 150 - 400 K/uL    ABGS No results for input(s): PHART, PO2ART, TCO2, HCO3 in the last 72 hours.  Invalid input(s): PCO2 CULTURES No results found for this or any previous visit (from the past 240 hour(s)). Studies/Results: Dg Chest Portable 1 View  02/06/2016  CLINICAL DATA:  Shortness of breath.  Duration: Earlier today. EXAM: PORTABLE CHEST 1 VIEW COMPARISON:  06/24/2015 FINDINGS: The patient is rotated to the right on today's radiograph, reducing diagnostic sensitivity and specificity. Mildly elevated right hemidiaphragm with suspected atelectasis along the right hemidiaphragm. The left lung appears clear. Thoracic spondylosis noted. IMPRESSION: 1. Borderline cardiomegaly. 2. Mildly elevated right hemidiaphragm with suspected atelectasis along the right hemidiaphragm. 3. Thoracic spondylosis. Electronically Signed   By: Van Clines M.D.   On: 02/06/2016 16:39    Medications: I have reviewed the patient's current medications.  Assesment:   Principal Problem:   Acute respiratory failure with hypoxia (HCC) Active Problems:   Paroxysmal supraventricular tachycardia-nonsustained   Morbid obesity ([place])   Hypertension   Obstructive sleep apnea   COPD exacerbation (H[facility])   Hypoxia    Plan:  Medications reviewed continue IV steroid and nebulizer treatment Will do pulmonary consult Continue regular treatment.     Lyndle Pang 02/07/2016, 7:49 AM

## 2016-02-07 NOTE — Care Management Note (Signed)
Case Management Note  Patient Details  Name: Sabrina Curry MRN: [id number] Date of Birth: 1951/01/21  Subjective/Objective:                  Pt is from home, lives with her husband. Pt has [facility] aid M-F 3hrs per day. Pt has CPAP and neb machine at home. Pt has no home O2 PTA. Pt has cane and walker for mobility. Pt plans to return home with self care and aid.   Action/Plan: No CM needs anticipated.   Expected Discharge Date:    02/07/2016              Expected Discharge Plan:  Home/Self Care  In-House Referral:  NA  Discharge planning Services  CM Consult  Post Acute Care Choice:  NA Choice offered to:  NA  DME Arranged:    DME Agency:     HH Arranged:    HH Agency:     Status of Service:  Completed, signed off  Medicare Important Message Given:    Date Medicare IM Given:    Medicare IM give by:    Date Additional Medicare IM Given:    Additional Medicare Important Message give by:     If discussed at [place] of Stay Meetings, dates discussed:    Additional Comments:  Sherald Barge, RN 02/07/2016, 1:46 PM

## 2016-02-07 NOTE — Consult Note (Signed)
Consult requested by: Dr. Legrand Rams Consult requested for asthma with acute exacerbation:  HPI: This is a 65 year old who says she's had a history of asthma for many years. She has recently been diagnosed with sleep apnea. She had been in her usual state of pretty good health when she developed increasing cough congestion and wheezing over the last week or so. She eventually came to the emergency room where she was treated but did not improve enough for discharge  Past Medical History  Diagnosis Date  . Asthma   . Anxiety   . GERD (gastroesophageal reflux disease)   . Headache(784.0)   . Arthritis   . Chronic back pain   . Hypertension   . Obstructive sleep apnea     On CPAP by nasal prong  . Thoracic aortic aneurysm [facility]) 12/2012     12/2012: 4.3 cm fusiform aneurysm-ascending thoracic aorta  . Palpitations 12/2012    ? SVT  . H/O dizziness   . Seasonal allergies   . Hx of migraines   . Knee pain, bilateral   . H/O shortness of breath   . COPD (chronic obstructive pulmonary disease) ([place])   . Stroke [facility])     last stoke Oct 2016     Family History  Problem Relation Age of Onset  . Anesthesia problems Neg Hx   . Hypotension Neg Hx   . Pseudochol deficiency Neg Hx   . Malignant hyperthermia Neg Hx   . Hypertension Mother   . Diabetes Mother   . Hyperlipidemia Father     Also hypertension and an aneurysm     Social History   Social History  . Marital Status: Married    Spouse Name: N/A  . Number of Children: N/A  . Years of Education: N/A   Occupational History  . CNA-retired     [employer]   Social History Main Topics  . Smoking status: Never Smoker   . Smokeless tobacco: Never Used  . Alcohol Use: No  . Drug Use: No  . Sexual Activity: Yes    Birth Control/ Protection: Post-menopausal, Surgical   Other Topics Concern  . None   Social History Narrative     ROS: She denies fever or chills. She has been coughing up some sputum. No chest pain. She  complains that she's had some swelling of her legs. Otherwise per the history and physical which I have reviewed    Objective: Vital signs in last 24 hours: Temp:  [98 F (36.7 C)-98.4 F (36.9 C)] 98 F (36.7 C) (05/22 0601) Pulse Rate:  [70-108] 70 (05/22 0601) Resp:  [15-28] 18 (05/22 0601) BP: (115-161)/(66-94) 115/88 mmHg (05/22 0601) SpO2:  [91 %-98 %] 94 % (05/22 0831) Weight:  [113.399 kg (250 lb)-118 kg (260 lb 2.3 oz)] 118 kg (260 lb 2.3 oz) (05/21 2012) Weight change:  Last BM Date: 02/06/16  Intake/Output from previous day: 05/21 0701 - 05/22 0700 In: -  Out: 79 [Urine:1100]  PHYSICAL EXAM She is an obese female who is coughing and wheezing during the examination. She is awake and alert. Her pupils are reactive nose and throat are clear mucous members are moist her neck is supple without masses. Chest shows bilateral wheezes. Her heart is regular without gallop. Her abdomen is soft without masses. Extremities showed no edema. Central nervous system exam is grossly intact  Lab Results: Basic Metabolic Panel:  Recent Labs  02/06/16 1723 02/07/16 0526  NA 141 138  K 3.3* 3.9  CL 104 103  CO2 29 28  GLUCOSE 114* 154*  BUN 12 14  CREATININE 1.16* 1.04*  CALCIUM 9.2 8.8*   Liver Function Tests: No results for input(s): AST, ALT, ALKPHOS, BILITOT, PROT, ALBUMIN in the last 72 hours. No results for input(s): LIPASE, AMYLASE in the last 72 hours. No results for input(s): AMMONIA in the last 72 hours. CBC:  Recent Labs  02/06/16 1723 02/07/16 0526  WBC 10.0 10.4  NEUTROABS 7.1  --   HGB 13.2 12.1  HCT 42.2 38.1  MCV 97.2 95.7  PLT 174 177   Cardiac Enzymes: No results for input(s): CKTOTAL, CKMB, CKMBINDEX, TROPONINI in the last 72 hours. BNP: No results for input(s): PROBNP in the last 72 hours. D-Dimer: No results for input(s): DDIMER in the last 72 hours. CBG: No results for input(s): GLUCAP in the last 72 hours. Hemoglobin A1C: No results  for input(s): HGBA1C in the last 72 hours. Fasting Lipid Panel: No results for input(s): CHOL, HDL, LDLCALC, TRIG, CHOLHDL, LDLDIRECT in the last 72 hours. Thyroid Function Tests: No results for input(s): TSH, T4TOTAL, FREET4, T3FREE, THYROIDAB in the last 72 hours. Anemia Panel: No results for input(s): VITAMINB12, FOLATE, FERRITIN, TIBC, IRON, RETICCTPCT in the last 72 hours. Coagulation: No results for input(s): LABPROT, INR in the last 72 hours. Urine Drug Screen: Drugs of Abuse  No results found for: LABOPIA, COCAINSCRNUR, LABBENZ, AMPHETMU, THCU, LABBARB  Alcohol Level: No results for input(s): ETH in the last 72 hours. Urinalysis: No results for input(s): COLORURINE, LABSPEC, PHURINE, GLUCOSEU, HGBUR, BILIRUBINUR, KETONESUR, PROTEINUR, UROBILINOGEN, NITRITE, LEUKOCYTESUR in the last 72 hours.  Invalid input(s): APPERANCEUR Misc. Labs:   ABGS: No results for input(s): PHART, PO2ART, TCO2, HCO3 in the last 72 hours.  Invalid input(s): PCO2   MICROBIOLOGY: No results found for this or any previous visit (from the past 240 hour(s)).  Studies/Results: Dg Chest Portable 1 View  02/06/2016  CLINICAL DATA:  Shortness of breath.  Duration: Earlier today. EXAM: PORTABLE CHEST 1 VIEW COMPARISON:  06/24/2015 FINDINGS: The patient is rotated to the right on today's radiograph, reducing diagnostic sensitivity and specificity. Mildly elevated right hemidiaphragm with suspected atelectasis along the right hemidiaphragm. The left lung appears clear. Thoracic spondylosis noted. IMPRESSION: 1. Borderline cardiomegaly. 2. Mildly elevated right hemidiaphragm with suspected atelectasis along the right hemidiaphragm. 3. Thoracic spondylosis. Electronically Signed   By: Van Clines M.D.   On: 02/06/2016 16:39    Medications:  Prior to Admission:  Prescriptions prior to admission  Medication Sig Dispense Refill Last Dose  . amLODipine (NORVASC) 10 MG tablet TAKE ONE TABLET BY MOUTH DAILY.  30 tablet 11 02/06/2016  . aspirin 81 MG tablet Take 2 tablets (162 mg total) by mouth daily. 30 tablet  02/06/2016  . chlorthalidone (HYGROTON) 25 MG tablet Take 1 tablet (25 mg total) by mouth daily. 90 tablet 3 02/06/2016  . isosorbide mononitrate (IMDUR) 30 MG 24 hr tablet TAKE ONE TABLET BY MOUTH ONCE DAILY. 30 tablet 3 02/06/2016  . lisinopril (PRINIVIL,ZESTRIL) 40 MG tablet Take 1 tablet (40 mg total) by mouth daily. 90 tablet 3 02/06/2016  . metoprolol (LOPRESSOR) 100 MG tablet TAKE ONE TABLET BY MOUTH TWICE DAILY. 60 tablet 3 02/06/2016 at 0600  . oxyCODONE-acetaminophen (PERCOCET/ROXICET) 5-325 MG tablet Take 1 tablet by mouth daily as needed for moderate pain or severe pain.   02/05/2016 at Unknown time  . pantoprazole (PROTONIX) 40 MG tablet Take 1 tablet (40 mg total) by mouth daily. 30 tablet 1  02/06/2016  . potassium chloride (K-DUR) 10 MEQ tablet TAKE ONE TABLET BY MOUTH TWICE DAILY. 60 tablet 3 02/06/2016  . traZODone (DESYREL) 50 MG tablet Take 50-100 mg by mouth at bedtime.   unknown  . vitamin B-12 (CYANOCOBALAMIN) 1000 MCG tablet Take 1 tablet (1,000 mcg total) by mouth daily. 30 tablet 1 02/06/2016   Scheduled: . amLODipine  10 mg Oral Daily  . aspirin EC  162 mg Oral Daily  . azithromycin  250 mg Oral Daily  . enoxaparin (LOVENOX) injection  40 mg Subcutaneous Q24H  . ipratropium-albuterol  3 mL Nebulization Q6H  . isosorbide mononitrate  30 mg Oral Daily  . lisinopril  40 mg Oral Daily  . methylPREDNISolone (SOLU-MEDROL) injection  80 mg Intravenous Q12H  . metoprolol  100 mg Oral BID  . pantoprazole  40 mg Oral Daily  . sodium chloride flush  3 mL Intravenous Q12H   Continuous:  FMM:CRFVOH chloride, albuterol, albuterol, oxyCODONE-acetaminophen, sodium chloride flush  Assesment: She is admitted with asthma exacerbation and acute hypoxic respiratory failure. She has obstructive sleep apnea as well. She has swelling of her legs and echocardiogram has been ordered which I  think is appropriate. This may be related to her amlodipine however Principal Problem:   Acute respiratory failure with hypoxia (HCC) Active Problems:   Paroxysmal supraventricular tachycardia-nonsustained   Morbid obesity ([place])   Hypertension   Obstructive sleep apnea   COPD exacerbation ([place])   Hypoxia    Plan: He is currently on antibiotics and steroids inhaled bronchodilators so I don't think there is much to add. Once she is a bit better we can add combination steroid with long-acting bronchodilator but that would probably be tomorrow. Otherwise continue. Thanks for allowing me to see her with you      Jalila Goodnough L 02/07/2016, 9:13 AM

## 2016-02-07 NOTE — Progress Notes (Signed)
  Echocardiogram 2D Echocardiogram has been performed.  Sabrina Curry 02/07/2016, 11:39 AM

## 2016-02-08 MED ORDER — METHYLPREDNISOLONE SODIUM SUCC 40 MG IJ SOLR
40.0000 mg | Freq: Two times a day (BID) | INTRAMUSCULAR | Status: DC
  Administered 2016-02-08 – 2016-02-09 (×2): 40 mg via INTRAVENOUS
  Filled 2016-02-08 (×2): qty 1

## 2016-02-08 MED ORDER — ONDANSETRON HCL 4 MG/2ML IJ SOLN
4.0000 mg | Freq: Three times a day (TID) | INTRAMUSCULAR | Status: DC | PRN
  Filled 2016-02-08: qty 2

## 2016-02-08 MED ORDER — BENZONATATE 100 MG PO CAPS
200.0000 mg | ORAL_CAPSULE | Freq: Three times a day (TID) | ORAL | Status: DC | PRN
  Administered 2016-02-08: 200 mg via ORAL
  Filled 2016-02-08: qty 2

## 2016-02-08 MED ORDER — OXYCODONE-ACETAMINOPHEN 5-325 MG PO TABS
1.0000 | ORAL_TABLET | ORAL | Status: DC | PRN
  Administered 2016-02-08 – 2016-02-09 (×2): 1 via ORAL
  Filled 2016-02-08 (×2): qty 1

## 2016-02-08 MED ORDER — ALUM & MAG HYDROXIDE-SIMETH 200-200-20 MG/5ML PO SUSP
30.0000 mL | ORAL | Status: DC | PRN
  Administered 2016-02-08: 30 mL via ORAL
  Filled 2016-02-08: qty 30

## 2016-02-08 MED ORDER — IPRATROPIUM-ALBUTEROL 0.5-2.5 (3) MG/3ML IN SOLN
3.0000 mL | Freq: Three times a day (TID) | RESPIRATORY_TRACT | Status: DC
  Administered 2016-02-09 (×2): 3 mL via RESPIRATORY_TRACT
  Filled 2016-02-08 (×2): qty 3

## 2016-02-08 MED ORDER — SALINE SPRAY 0.65 % NA SOLN
1.0000 | NASAL | Status: DC | PRN
  Administered 2016-02-08: 1 via NASAL
  Filled 2016-02-08: qty 44

## 2016-02-08 MED ORDER — ALPRAZOLAM 0.5 MG PO TABS
0.5000 mg | ORAL_TABLET | Freq: Three times a day (TID) | ORAL | Status: DC | PRN
  Administered 2016-02-08: 0.5 mg via ORAL
  Filled 2016-02-08: qty 1

## 2016-02-08 NOTE — Progress Notes (Signed)
Subjective: Patient feels better. She is improving. Cough and shortness breath is less..   Objective: Vital signs in last 24 hours: Temp:  [97.9 F (36.6 C)-98.4 F (36.9 C)] 97.9 F (36.6 C) (05/23 0513) Pulse Rate:  [64-71] 66 (05/23 0513) Resp:  [18-20] 20 (05/23 0513) BP: (95-133)/(52-79) 121/71 mmHg (05/23 0513) SpO2:  [90 %-98 %] 98 % (05/23 0513) Weight:  [116.711 kg (257 lb 4.8 oz)-117.028 kg (258 lb)] 116.711 kg (257 lb 4.8 oz) (05/23 0513) Weight change: 3.629 kg (8 lb) Last BM Date: 02/06/16  Intake/Output from previous day: 05/22 0701 - 05/23 0700 In: 720 [P.O.:720] Out: 1050 [Urine:1050]  PHYSICAL EXAM General appearance: alert and no distress Resp: diminished breath sounds bilaterally and wheezes bilaterally Cardio: S1, S2 normal GI: soft, non-tender; bowel sounds normal; no masses,  no organomegaly Extremities: extremities normal, atraumatic, no cyanosis or edema  Lab Results:  Results for orders placed or performed during the hospital encounter of 02/06/16 (from the past 48 hour(s))  Basic metabolic panel     Status: Abnormal   Collection Time: 02/06/16  5:23 PM  Result Value Ref Range   Sodium 141 135 - 145 mmol/L   Potassium 3.3 (L) 3.5 - 5.1 mmol/L   Chloride 104 101 - 111 mmol/L   CO2 29 22 - 32 mmol/L   Glucose, Bld 114 (H) 65 - 99 mg/dL   BUN 12 6 - 20 mg/dL   Creatinine, Ser 1.16 (H) 0.44 - 1.00 mg/dL   Calcium 9.2 8.9 - 10.3 mg/dL   GFR calc non Af Amer 49 (L) >60 mL/min   GFR calc Af Amer 57 (L) >60 mL/min    Comment: (NOTE) The eGFR has been calculated using the CKD EPI equation. This calculation has not been validated in all clinical situations. eGFR's persistently <60 mL/min signify possible Chronic Kidney Disease.    Anion gap 8 5 - 15  CBC with Differential/Platelet     Status: None   Collection Time: 02/06/16  5:23 PM  Result Value Ref Range   WBC 10.0 4.0 - 10.5 K/uL   RBC 4.34 3.87 - 5.11 MIL/uL   Hemoglobin 13.2 12.0 - 15.0  g/dL   HCT 42.2 36.0 - 46.0 %   MCV 97.2 78.0 - 100.0 fL   MCH 30.4 26.0 - 34.0 pg   MCHC 31.3 30.0 - 36.0 g/dL   RDW 13.5 11.5 - 15.5 %   Platelets 174 150 - 400 K/uL   Neutrophils Relative % 72 %   Neutro Abs 7.1 1.7 - 7.7 K/uL   Lymphocytes Relative 23 %   Lymphs Abs 2.3 0.7 - 4.0 K/uL   Monocytes Relative 3 %   Monocytes Absolute 0.3 0.1 - 1.0 K/uL   Eosinophils Relative 2 %   Eosinophils Absolute 0.2 0.0 - 0.7 K/uL   Basophils Relative 0 %   Basophils Absolute 0.0 0.0 - 0.1 K/uL  Brain natriuretic peptide     Status: Abnormal   Collection Time: 02/06/16  5:23 PM  Result Value Ref Range   B Natriuretic Peptide 172.0 (H) 0.0 - 100.0 pg/mL  I-stat troponin, ED     Status: None   Collection Time: 02/06/16  5:30 PM  Result Value Ref Range   Troponin i, poc 0.00 0.00 - 0.08 ng/mL   Comment 3            Comment: Due to the release kinetics of cTnI, a negative result within the first hours of the onset of  symptoms does not rule out myocardial infarction with certainty. If myocardial infarction is still suspected, repeat the test at appropriate intervals.   Basic metabolic panel     Status: Abnormal   Collection Time: 02/07/16  5:26 AM  Result Value Ref Range   Sodium 138 135 - 145 mmol/L   Potassium 3.9 3.5 - 5.1 mmol/L   Chloride 103 101 - 111 mmol/L   CO2 28 22 - 32 mmol/L   Glucose, Bld 154 (H) 65 - 99 mg/dL   BUN 14 6 - 20 mg/dL   Creatinine, Ser 1.04 (H) 0.44 - 1.00 mg/dL   Calcium 8.8 (L) 8.9 - 10.3 mg/dL   GFR calc non Af Amer 56 (L) >60 mL/min   GFR calc Af Amer >60 >60 mL/min    Comment: (NOTE) The eGFR has been calculated using the CKD EPI equation. This calculation has not been validated in all clinical situations. eGFR's persistently <60 mL/min signify possible Chronic Kidney Disease.    Anion gap 7 5 - 15  CBC     Status: None   Collection Time: 02/07/16  5:26 AM  Result Value Ref Range   WBC 10.4 4.0 - 10.5 K/uL   RBC 3.98 3.87 - 5.11 MIL/uL    Hemoglobin 12.1 12.0 - 15.0 g/dL   HCT 38.1 36.0 - 46.0 %   MCV 95.7 78.0 - 100.0 fL   MCH 30.4 26.0 - 34.0 pg   MCHC 31.8 30.0 - 36.0 g/dL   RDW 13.4 11.5 - 15.5 %   Platelets 177 150 - 400 K/uL    ABGS No results for input(s): PHART, PO2ART, TCO2, HCO3 in the last 72 hours.  Invalid input(s): PCO2 CULTURES No results found for this or any previous visit (from the past 240 hour(s)). Studies/Results: Dg Chest Portable 1 View  02/06/2016  CLINICAL DATA:  Shortness of breath.  Duration: Earlier today. EXAM: PORTABLE CHEST 1 VIEW COMPARISON:  06/24/2015 FINDINGS: The patient is rotated to the right on today's radiograph, reducing diagnostic sensitivity and specificity. Mildly elevated right hemidiaphragm with suspected atelectasis along the right hemidiaphragm. The left lung appears clear. Thoracic spondylosis noted. IMPRESSION: 1. Borderline cardiomegaly. 2. Mildly elevated right hemidiaphragm with suspected atelectasis along the right hemidiaphragm. 3. Thoracic spondylosis. Electronically Signed   By: Van Clines M.D.   On: 02/06/2016 16:39    Medications: I have reviewed the patient's current medications.  Assesment:   Principal Problem:   Acute respiratory failure with hypoxia (HCC) Active Problems:   Paroxysmal supraventricular tachycardia-nonsustained   Morbid obesity ([place])   Hypertension   Obstructive sleep apnea   COPD exacerbation (HCC)   Hypoxia    Plan:  Medications reviewed Will taper IV steroid  pulmonary consult appreciated.    LOS: 1 day   Sabrina Curry 02/08/2016, 8:38 AM

## 2016-02-08 NOTE — Progress Notes (Signed)
Subjective: She looks better. She has no new complaints. She is still coughing but less  Objective: Vital signs in last 24 hours: Temp:  [97.9 F (36.6 C)-98.4 F (36.9 C)] 97.9 F (36.6 C) (05/23 0513) Pulse Rate:  [64-71] 66 (05/23 0513) Resp:  [18-20] 20 (05/23 0513) BP: (95-133)/(52-79) 121/71 mmHg (05/23 0513) SpO2:  [90 %-98 %] 98 % (05/23 0513) Weight:  [116.711 kg (257 lb 4.8 oz)-117.028 kg (258 lb)] 116.711 kg (257 lb 4.8 oz) (05/23 0513) Weight change: 3.629 kg (8 lb) Last BM Date: 02/06/16  Intake/Output from previous day: 05/22 0701 - 05/23 0700 In: 720 [P.O.:720] Out: 1050 [Urine:1050]  PHYSICAL EXAM General appearance: alert, cooperative and mild distress Resp: She still has end expiratory wheezing but less than before Cardio: regular rate and rhythm, S1, S2 normal, no murmur, click, rub or gallop GI: soft, non-tender; bowel sounds normal; no masses,  no organomegaly Extremities: Edema is much better  Lab Results:  Results for orders placed or performed during the hospital encounter of 02/06/16 (from the past 48 hour(s))  Basic metabolic panel     Status: Abnormal   Collection Time: 02/06/16  5:23 PM  Result Value Ref Range   Sodium 141 135 - 145 mmol/Curry   Potassium 3.3 (Curry) 3.5 - 5.1 mmol/Curry   Chloride 104 101 - 111 mmol/Curry   CO2 29 22 - 32 mmol/Curry   Glucose, Bld 114 (H) 65 - 99 mg/dL   BUN 12 6 - 20 mg/dL   Creatinine, Ser 1.16 (H) 0.44 - 1.00 mg/dL   Calcium 9.2 8.9 - 10.3 mg/dL   GFR calc non Af Amer 49 (Curry) >60 mL/min   GFR calc Af Amer 57 (Curry) >60 mL/min    Comment: (NOTE) The eGFR has been calculated using the CKD EPI equation. This calculation has not been validated in all clinical situations. eGFR's persistently <60 mL/min signify possible Chronic Kidney Disease.    Anion gap 8 5 - 15  CBC with Differential/Platelet     Status: None   Collection Time: 02/06/16  5:23 PM  Result Value Ref Range   WBC 10.0 4.0 - 10.5 K/uL   RBC 4.34 3.87 - 5.11  MIL/uL   Hemoglobin 13.2 12.0 - 15.0 g/dL   HCT 42.2 36.0 - 46.0 %   MCV 97.2 78.0 - 100.0 fL   MCH 30.4 26.0 - 34.0 pg   MCHC 31.3 30.0 - 36.0 g/dL   RDW 13.5 11.5 - 15.5 %   Platelets 174 150 - 400 K/uL   Neutrophils Relative % 72 %   Neutro Abs 7.1 1.7 - 7.7 K/uL   Lymphocytes Relative 23 %   Lymphs Abs 2.3 0.7 - 4.0 K/uL   Monocytes Relative 3 %   Monocytes Absolute 0.3 0.1 - 1.0 K/uL   Eosinophils Relative 2 %   Eosinophils Absolute 0.2 0.0 - 0.7 K/uL   Basophils Relative 0 %   Basophils Absolute 0.0 0.0 - 0.1 K/uL  Brain natriuretic peptide     Status: Abnormal   Collection Time: 02/06/16  5:23 PM  Result Value Ref Range   B Natriuretic Peptide 172.0 (H) 0.0 - 100.0 pg/mL  I-stat troponin, ED     Status: None   Collection Time: 02/06/16  5:30 PM  Result Value Ref Range   Troponin i, poc 0.00 0.00 - 0.08 ng/mL   Comment 3            Comment: Due to the release kinetics of  cTnI, a negative result within the first hours of the onset of symptoms does not rule out myocardial infarction with certainty. If myocardial infarction is still suspected, repeat the test at appropriate intervals.   Basic metabolic panel     Status: Abnormal   Collection Time: 02/07/16  5:26 AM  Result Value Ref Range   Sodium 138 135 - 145 mmol/Curry   Potassium 3.9 3.5 - 5.1 mmol/Curry   Chloride 103 101 - 111 mmol/Curry   CO2 28 22 - 32 mmol/Curry   Glucose, Bld 154 (H) 65 - 99 mg/dL   BUN 14 6 - 20 mg/dL   Creatinine, Ser 1.04 (H) 0.44 - 1.00 mg/dL   Calcium 8.8 (Curry) 8.9 - 10.3 mg/dL   GFR calc non Af Amer 56 (Curry) >60 mL/min   GFR calc Af Amer >60 >60 mL/min    Comment: (NOTE) The eGFR has been calculated using the CKD EPI equation. This calculation has not been validated in all clinical situations. eGFR's persistently <60 mL/min signify possible Chronic Kidney Disease.    Anion gap 7 5 - 15  CBC     Status: None   Collection Time: 02/07/16  5:26 AM  Result Value Ref Range   WBC 10.4 4.0 - 10.5 K/uL    RBC 3.98 3.87 - 5.11 MIL/uL   Hemoglobin 12.1 12.0 - 15.0 g/dL   HCT 38.1 36.0 - 46.0 %   MCV 95.7 78.0 - 100.0 fL   MCH 30.4 26.0 - 34.0 pg   MCHC 31.8 30.0 - 36.0 g/dL   RDW 13.4 11.5 - 15.5 %   Platelets 177 150 - 400 K/uL    ABGS No results for input(s): PHART, PO2ART, TCO2, HCO3 in the last 72 hours.  Invalid input(s): PCO2 CULTURES No results found for this or any previous visit (from the past 240 hour(s)). Studies/Results: Dg Chest Portable 1 View  02/06/2016  CLINICAL DATA:  Shortness of breath.  Duration: Earlier today. EXAM: PORTABLE CHEST 1 VIEW COMPARISON:  06/24/2015 FINDINGS: The patient is rotated to the right on today's radiograph, reducing diagnostic sensitivity and specificity. Mildly elevated right hemidiaphragm with suspected atelectasis along the right hemidiaphragm. The left lung appears clear. Thoracic spondylosis noted. IMPRESSION: 1. Borderline cardiomegaly. 2. Mildly elevated right hemidiaphragm with suspected atelectasis along the right hemidiaphragm. 3. Thoracic spondylosis. Electronically Signed   By: Van Clines M.D.   On: 02/06/2016 16:39    Medications:  Prior to Admission:  Prescriptions prior to admission  Medication Sig Dispense Refill Last Dose  . amLODipine (NORVASC) 10 MG tablet TAKE ONE TABLET BY MOUTH DAILY. 30 tablet 11 02/06/2016  . aspirin 81 MG tablet Take 2 tablets (162 mg total) by mouth daily. 30 tablet  02/06/2016  . chlorthalidone (HYGROTON) 25 MG tablet Take 1 tablet (25 mg total) by mouth daily. 90 tablet 3 02/06/2016  . isosorbide mononitrate (IMDUR) 30 MG 24 hr tablet TAKE ONE TABLET BY MOUTH ONCE DAILY. 30 tablet 3 02/06/2016  . lisinopril (PRINIVIL,ZESTRIL) 40 MG tablet Take 1 tablet (40 mg total) by mouth daily. 90 tablet 3 02/06/2016  . metoprolol (LOPRESSOR) 100 MG tablet TAKE ONE TABLET BY MOUTH TWICE DAILY. 60 tablet 3 02/06/2016 at 0600  . oxyCODONE-acetaminophen (PERCOCET/ROXICET) 5-325 MG tablet Take 1 tablet by mouth  daily as needed for moderate pain or severe pain.   02/05/2016 at Unknown time  . pantoprazole (PROTONIX) 40 MG tablet Take 1 tablet (40 mg total) by mouth daily. 30 tablet 1 02/06/2016  .  potassium chloride (K-DUR) 10 MEQ tablet TAKE ONE TABLET BY MOUTH TWICE DAILY. 60 tablet 3 02/06/2016  . traZODone (DESYREL) 50 MG tablet Take 50-100 mg by mouth at bedtime.   unknown  . vitamin B-12 (CYANOCOBALAMIN) 1000 MCG tablet Take 1 tablet (1,000 mcg total) by mouth daily. 30 tablet 1 02/06/2016   Scheduled: . amLODipine  10 mg Oral Daily  . aspirin EC  162 mg Oral Daily  . azithromycin  250 mg Oral Daily  . enoxaparin (LOVENOX) injection  40 mg Subcutaneous Q24H  . ipratropium-albuterol  3 mL Nebulization Q6H  . isosorbide mononitrate  30 mg Oral Daily  . lisinopril  40 mg Oral Daily  . methylPREDNISolone (SOLU-MEDROL) injection  40 mg Intravenous Q12H  . metoprolol  100 mg Oral BID  . pantoprazole  40 mg Oral Daily  . sodium chloride flush  3 mL Intravenous Q12H   Continuous:  EFE:OFHQRF chloride, albuterol, oxyCODONE-acetaminophen, sodium chloride flush  Assesment: She was admitted with acute hypoxic respiratory failure. She has sleep apnea also. She has some element of obesity hypoventilation. She has COPD/asthma exacerbation. She had edema when she came into the hospital but her echocardiogram does not show evidence of heart failure either systolic or diastolic she is improving Principal Problem:   Acute respiratory failure with hypoxia (HCC) Active Problems:   Paroxysmal supraventricular tachycardia-nonsustained   Morbid obesity ([place])   Hypertension   Obstructive sleep apnea   COPD exacerbation ([place])   Hypoxia    Plan: Continue current treatments    LOS: 1 day   Sabrina Curry 02/08/2016, 8:49 AM

## 2016-02-08 NOTE — Progress Notes (Signed)
SATURATION QUALIFICATIONS: (This note is used to comply with regulatory documentation for home oxygen)  Patient Saturations on Room Air at Rest = 94%  Patient Saturations on Room Air while Ambulating = 87-88%  Patient Saturations on 2 Liters of oxygen while Ambulating = 93%

## 2016-02-08 NOTE — Progress Notes (Signed)
Pt stated she was having continuous pain in her back and chest area.  Paged Dr on call; [place].  He ordered another dose of Percocet 5-325 and changed its frequency to every 4 hours PRN.

## 2016-02-09 MED ORDER — AZITHROMYCIN 250 MG PO TABS: 250.0000 mg | ORAL_TABLET | Freq: Once | ORAL | Status: DC

## 2016-02-09 MED ORDER — PREDNISONE 10 MG (21) PO TBPK: 10.0000 mg | ORAL_TABLET | Freq: Every day | ORAL | Status: DC

## 2016-02-09 NOTE — Progress Notes (Signed)
She had some back and chest pain last night. She is much improved this morning. Her breathing is better. Plans are for her to be discharged today so I will of course Sign off.  Thanks for allowing me to see her with you

## 2016-02-09 NOTE — Care Management Note (Signed)
Case Management Note  Patient Details  Name: Sabrina Curry MRN: [id number] Date of Birth: [birth year]  Subjective/Objective:  Spoke with patient again for continued dc planning. Patient will be discharged today with Home O2. Patient has no preference of DME provider. Referral placed with [facility]. Patient has [employer], DME walker, Lives with spouse and depends on family and friends for transportation.                Action/Plan: Home with self care.  Home O2  Expected Discharge Date:                  Expected Discharge Plan:  Home/Self Care  In-House Referral:  NA  Discharge planning Services  CM Consult  Post Acute Care Choice:  NA Choice offered to:  NA  DME Arranged:    DME Agency:     HH Arranged:    HH Agency:     Status of Service:  Completed, signed off  Medicare Important Message Given:    Date Medicare IM Given:    Medicare IM give by:    Date Additional Medicare IM Given:    Additional Medicare Important Message give by:     If discussed at [place] of Stay Meetings, dates discussed:    Additional Comments:  Alvie Heidelberg, RN 02/09/2016, 1:05 PM

## 2016-02-09 NOTE — Progress Notes (Addendum)
Reviewed discharge instructions with pt and answered all questions at this time.  Awaiting for oxygen to be delivered for [place], then pt ready to discharge home.

## 2016-02-22 ENCOUNTER — Encounter: Payer: Self-pay | Admitting: Adult Health

## 2016-02-22 ENCOUNTER — Ambulatory Visit (INDEPENDENT_AMBULATORY_CARE_PROVIDER_SITE_OTHER): Payer: Medicaid Other | Admitting: Adult Health

## 2016-02-22 VITALS — BP 130/90 | HR 82 | Ht <= 58 in | Wt 253.0 lb

## 2016-02-22 DIAGNOSIS — R0689 Other abnormalities of breathing: Secondary | ICD-10-CM

## 2016-02-22 DIAGNOSIS — I10 Essential (primary) hypertension: Secondary | ICD-10-CM

## 2016-02-22 DIAGNOSIS — R0600 Dyspnea, unspecified: Secondary | ICD-10-CM | POA: Diagnosis not present

## 2016-02-22 NOTE — Progress Notes (Signed)
.  zrr

## 2016-02-22 NOTE — Progress Notes (Signed)
Cardiology Office Note   Date:  02/22/2016   ID:  Sabrina, Curry [birth year], MRN [id number]  PCP:  Rosita Fire, MD  Cardiologist: Woodroe Chen, NP   Chief Complaint  Patient presents with  . Hypertension      History of Present Illness: Sabrina Curry is a 65 y.o. female who presents for ongoing assessment and management of hypertension with hx of OSA, CVA and morbid obesity. Recent admission to [facility] in May of 2017 for worsening dyspnea and acute respiratory failure.   She is without complaint today with the exception of some mild lower extremity edema. She does have some pain in her legs from this. She continues on oxygen 2 L per nasal cannula. She continues to have dyspnea on exertion. She denies any  significant chest pain. She states that she is having trouble decreasing salty foods in her diet. She is otherwise medically compliant.  Past Medical History  Diagnosis Date  . Asthma   . Anxiety   . GERD (gastroesophageal reflux disease)   . Headache(784.0)   . Arthritis   . Chronic back pain   . Hypertension   . Obstructive sleep apnea     On CPAP by nasal prong  . Thoracic aortic aneurysm [facility]) 12/2012     12/2012: 4.3 cm fusiform aneurysm-ascending thoracic aorta  . Palpitations 12/2012    ? SVT  . H/O dizziness   . Seasonal allergies   . Hx of migraines   . Knee pain, bilateral   . H/O shortness of breath   . COPD (chronic obstructive pulmonary disease) ([place])   . Stroke [facility])     last stoke Oct 2016    Past Surgical History  Procedure Laterality Date  . Abdominal hysterectomy    . Cholecystectomy      [facility]  . Back surgery      had staff infection of back  . Umbilical hernia repair    . Hernia repair  2009    inscional hernia repair x2-3  . Appendectomy      with gallbladder removal  . Cataract extraction w/phaco  01/16/2012    Procedure: CATARACT EXTRACTION PHACO AND INTRAOCULAR LENS PLACEMENT (IOC);  Surgeon: Elta Guadeloupe T. Gershon Crane, MD;   Location: [facility];  Service: Ophthalmology;  Laterality: Right;  CDE 6.12     Current Outpatient Prescriptions  Medication Sig Dispense Refill  . amLODipine (NORVASC) 10 MG tablet TAKE ONE TABLET BY MOUTH DAILY. 30 tablet 11  . aspirin 81 MG tablet Take 2 tablets (162 mg total) by mouth daily. 30 tablet   . azithromycin (ZITHROMAX) 250 MG tablet Take 1 tablet (250 mg total) by mouth once. 2 each 0  . chlorthalidone (HYGROTON) 25 MG tablet Take 1 tablet (25 mg total) by mouth daily. 90 tablet 3  . isosorbide mononitrate (IMDUR) 30 MG 24 hr tablet TAKE ONE TABLET BY MOUTH ONCE DAILY. 30 tablet 3  . lisinopril (PRINIVIL,ZESTRIL) 40 MG tablet Take 1 tablet (40 mg total) by mouth daily. 90 tablet 3  . metoprolol (LOPRESSOR) 100 MG tablet TAKE ONE TABLET BY MOUTH TWICE DAILY. 60 tablet 3  . oxyCODONE-acetaminophen (PERCOCET/ROXICET) 5-325 MG tablet Take 1 tablet by mouth daily as needed for moderate pain or severe pain.    . pantoprazole (PROTONIX) 40 MG tablet Take 1 tablet (40 mg total) by mouth daily. 30 tablet 1  . potassium chloride (K-DUR) 10 MEQ tablet TAKE ONE TABLET BY MOUTH TWICE DAILY. 60 tablet 3  . predniSONE (STERAPRED  UNI-PAK 21 TAB) 10 MG (21) TBPK tablet Take 1 tablet (10 mg total) by mouth daily. 4 tab po daily for 3 days, 3 tab po daily for 3 days, 2 tab po daily for 3 days, 1 tab po daily for 3 days. 30 tablet 0  . traZODone (DESYREL) 50 MG tablet Take 50-100 mg by mouth at bedtime.    . vitamin B-12 (CYANOCOBALAMIN) 1000 MCG tablet Take 1 tablet (1,000 mcg total) by mouth daily. 30 tablet 1   No current facility-administered medications for this visit.    Allergies:   Vancomycin cross reactors    Social History:  The patient  reports that she has never smoked. She has never used smokeless tobacco. She reports that she does not drink alcohol or use illicit drugs.   Family History:  The patient's family history includes Diabetes in her mother; Hyperlipidemia in her father;  Hypertension in her mother. There is no history of Anesthesia problems, Hypotension, Pseudochol deficiency, or Malignant hyperthermia.    ROS: All other systems are reviewed and negative. Unless otherwise mentioned in H&P    PHYSICAL EXAM: VS:  There were no vitals taken for this visit. , BMI There is no weight on file to calculate BMI. GEN: Well nourished, well developed, in no acute distress HEENT: normal Neck: no JVD, carotid bruits, or masses Cardiac: RRR; no murmurs, rubs, or gallops,no edema  Respiratory:  clear to auscultation bilaterally, normal work of breathing. Wearing O2 at 2 L. GI: soft, nontender, nondistended, + BS. Obese nondistended MS: no deformity or atrophynonpitting edema in the dependent position. Skin: warm and dry, no rash Neuro:  Strength and sensation are intact Psych: euthymic mood, full affect   Recent Labs: 06/23/2015: ALT 5* 06/25/2015: TSH 1.551 02/06/2016: B Natriuretic Peptide 172.0* 02/07/2016: BUN 14; Creatinine, Ser 1.04*; Hemoglobin 12.1; Platelets 177; Potassium 3.9; Sodium 138    Lipid Panel    Component Value Date/Time   CHOL 128 06/24/2015 0404   TRIG 79 06/24/2015 0404   HDL 35* 06/24/2015 0404   CHOLHDL 3.7 06/24/2015 0404   VLDL 16 06/24/2015 0404   LDLCALC 77 06/24/2015 0404      Wt Readings from Last 3 Encounters:  02/09/16 254 lb 11.2 oz (115.531 kg)  01/11/16 248 lb (112.492 kg)  11/14/15 256 lb (116.121 kg)     ASSESSMENT AND PLAN:  1. Hypertension: Blood pressure is currently controlled on medication regimen. Some of her lower extremity edema may be related to amlodipine along with isosorbide. I have given her reassurance concerning this. Her weight is actually down 1 pound since discharge. The patient is advised on a low sodium diet which is given to her in written form. She will continue chlorthalidone and lisinopril. Potassium replacement.  2. Chest pain:noncardiac in etiology. Continue aspirin daily  3. Chronic knee  pain especially on the right.there were plans to schedule her for a stress test preoperatively. Did do to recent hospitalization and chronic COPD and lung disease, will await pulmonary to optimize her medical regimen and symptom management. May need to revisit this when we see her again in 3 months. At this time we'll not plan any cardiac testing for preoperative evaluation.   Current medicines are reviewed at length with the patient today.    Labs/ tests ordered today include: None No orders of the defined types were placed in this encounter.     Disposition:   FU with 3 months Signed, Jory Sims, NP  02/22/2016 12:49 PM  [facility] [address] [place], [place] [address] Phone: [phone]; Fax: [phone]

## 2016-02-22 NOTE — Progress Notes (Signed)
Name: Sabrina Curry    DOB: 1951-03-29  Age: 65 y.o.  MR#: [id number]       PCP:  Rosita Fire, MD      Insurance: Payor: MEDICAID [place] / Plan: MEDICAID [place] ACCESS / Product Type: *No Product type* /   CC:    Chief Complaint  Patient presents with  . Hypertension    VS Filed Vitals:   02/22/16 1314  BP: 130/90  Pulse: 82  Height: 4' 10"  (1.473 m)  Weight: 253 lb (114.76 kg)  SpO2: 95%    Weights Current Weight  02/22/16 253 lb (114.76 kg)  02/09/16 254 lb 11.2 oz (115.531 kg)  01/11/16 248 lb (112.492 kg)    Blood Pressure  BP Readings from Last 3 Encounters:  02/22/16 130/90  02/09/16 137/76  01/11/16 144/84     Admit date:  (Not on file) Last encounter with RMR:  01/11/2016   Allergy Vancomycin cross reactors  Current Outpatient Prescriptions  Medication Sig Dispense Refill  . amLODipine (NORVASC) 10 MG tablet TAKE ONE TABLET BY MOUTH DAILY. 30 tablet 11  . aspirin 81 MG tablet Take 2 tablets (162 mg total) by mouth daily. 30 tablet   . chlorthalidone (HYGROTON) 25 MG tablet Take 1 tablet (25 mg total) by mouth daily. 90 tablet 3  . isosorbide mononitrate (IMDUR) 30 MG 24 hr tablet TAKE ONE TABLET BY MOUTH ONCE DAILY. 30 tablet 3  . lisinopril (PRINIVIL,ZESTRIL) 40 MG tablet Take 1 tablet (40 mg total) by mouth daily. 90 tablet 3  . metoprolol (LOPRESSOR) 100 MG tablet TAKE ONE TABLET BY MOUTH TWICE DAILY. 60 tablet 3  . pantoprazole (PROTONIX) 40 MG tablet Take 1 tablet (40 mg total) by mouth daily. 30 tablet 1  . potassium chloride (K-DUR) 10 MEQ tablet TAKE ONE TABLET BY MOUTH TWICE DAILY. 60 tablet 3  . promethazine (PHENERGAN) 25 MG tablet Take 25 mg by mouth every 6 (six) hours as needed for nausea or vomiting.    . topiramate (TOPAMAX) 25 MG capsule Take 25 mg by mouth 2 (two) times daily.    . traZODone (DESYREL) 50 MG tablet Take 50-100 mg by mouth at bedtime.    . vitamin B-12 (CYANOCOBALAMIN) 1000 MCG tablet Take 1 tablet (1,000 mcg total) by mouth  daily. 30 tablet 1  . oxyCODONE-acetaminophen (PERCOCET/ROXICET) 5-325 MG tablet Take 1 tablet by mouth daily as needed for moderate pain or severe pain.     No current facility-administered medications for this visit.    Discontinued Meds:    Medications Discontinued During This Encounter  Medication Reason  . azithromycin (ZITHROMAX) 250 MG tablet Error  . predniSONE (STERAPRED UNI-PAK 21 TAB) 10 MG (21) TBPK tablet Error    Patient Active Problem List   Diagnosis Date Noted  . COPD exacerbation ([place]) 02/06/2016  . Acute respiratory failure with hypoxia ([place]) 02/06/2016  . Hypoxia 02/06/2016  . COPD (chronic obstructive pulmonary disease) ([place])   . Chronic back pain   . Chronic obstructive pulmonary disease ([place])   . Chest pain 06/24/2015  . TIA (transient ischemic attack) 06/24/2015  . UTI (lower urinary tract infection) 06/24/2015  . Difficulty speaking   . Slurred speech   . Skin eruption 01/30/2013  . Obstructive sleep apnea   . Thoracic aortic aneurysm ([place]) 12/17/2012  . Paroxysmal supraventricular tachycardia-nonsustained 12/08/2012  . Morbid obesity ([place]) 12/08/2012  . Asthma, chronic 12/08/2012  . Hypertension 12/08/2012    LABS    Component Value Date/Time   NA  138 02/07/2016 0526   NA 141 02/06/2016 1723   NA 141 06/24/2015 0404   K 3.9 02/07/2016 0526   K 3.3* 02/06/2016 1723   K 3.8 06/24/2015 0404   CL 103 02/07/2016 0526   CL 104 02/06/2016 1723   CL 106 06/24/2015 0404   CO2 28 02/07/2016 0526   CO2 29 02/06/2016 1723   CO2 29 06/24/2015 0404   GLUCOSE 154* 02/07/2016 0526   GLUCOSE 114* 02/06/2016 1723   GLUCOSE 107* 06/24/2015 0404   BUN 14 02/07/2016 0526   BUN 12 02/06/2016 1723   BUN 14 06/24/2015 0404   CREATININE 1.04* 02/07/2016 0526   CREATININE 1.16* 02/06/2016 1723   CREATININE 0.93 06/24/2015 0404   CREATININE 1.11* 03/12/2015 1137   CREATININE 1.30* 01/21/2014 0753   CREATININE 1.17* 02/26/2013 1437   CALCIUM 8.8*  02/07/2016 0526   CALCIUM 9.2 02/06/2016 1723   CALCIUM 8.5* 06/24/2015 0404   GFRNONAA 56* 02/07/2016 0526   GFRNONAA 49* 02/06/2016 1723   GFRNONAA >60 06/24/2015 0404   GFRAA >60 02/07/2016 0526   GFRAA 57* 02/06/2016 1723   GFRAA >60 06/24/2015 0404   CMP     Component Value Date/Time   NA 138 02/07/2016 0526   K 3.9 02/07/2016 0526   CL 103 02/07/2016 0526   CO2 28 02/07/2016 0526   GLUCOSE 154* 02/07/2016 0526   BUN 14 02/07/2016 0526   CREATININE 1.04* 02/07/2016 0526   CREATININE 1.11* 03/12/2015 1137   CALCIUM 8.8* 02/07/2016 0526   PROT 7.2 06/23/2015 2340   ALBUMIN 3.7 06/23/2015 2340   AST 21 06/23/2015 2340   ALT 5* 06/23/2015 2340   ALKPHOS 66 06/23/2015 2340   BILITOT 0.5 06/23/2015 2340   GFRNONAA 56* 02/07/2016 0526   GFRAA >60 02/07/2016 0526       Component Value Date/Time   WBC 10.4 02/07/2016 0526   WBC 10.0 02/06/2016 1723   WBC 7.4 06/24/2015 0404   HGB 12.1 02/07/2016 0526   HGB 13.2 02/06/2016 1723   HGB 11.9* 06/24/2015 0404   HCT 38.1 02/07/2016 0526   HCT 42.2 02/06/2016 1723   HCT 37.8 06/24/2015 0404   MCV 95.7 02/07/2016 0526   MCV 97.2 02/06/2016 1723   MCV 95.7 06/24/2015 0404    Lipid Panel     Component Value Date/Time   CHOL 128 06/24/2015 0404   TRIG 79 06/24/2015 0404   HDL 35* 06/24/2015 0404   CHOLHDL 3.7 06/24/2015 0404   VLDL 16 06/24/2015 0404   LDLCALC 77 06/24/2015 0404    ABG No results found for: PHART, PCO2ART, PO2ART, HCO3, TCO2, ACIDBASEDEF, O2SAT   Lab Results  Component Value Date   TSH 1.551 06/25/2015   BNP (last 3 results)  Recent Labs  02/06/16 1723  BNP 172.0*    ProBNP (last 3 results) No results for input(s): PROBNP in the last 8760 hours.  Cardiac Panel (last 3 results) No results for input(s): CKTOTAL, CKMB, TROPONINI, RELINDX in the last 72 hours.  Iron/TIBC/Ferritin/ %Sat No results found for: IRON, TIBC, FERRITIN, IRONPCTSAT   EKG Orders placed or performed during the  hospital encounter of 02/06/16  . EKG 12-Lead  . EKG 12-Lead     Prior Assessment and Plan Problem List as of 02/22/2016      Cardiovascular and Mediastinum   Paroxysmal supraventricular tachycardia-nonsustained   Last Assessment & Plan 03/04/2013 Office Visit Written 03/04/2013  3:35 PM by Yehuda Savannah, MD    Symptoms not controlled  with moderate dose beta blocker. Patient has a borderline first degree AV block and intermittent mild sinus bradycardia.  Low dose diltiazem will be added with monitoring of vital signs and periodic assessment of electrocardiographic intervals.      Hypertension   Last Assessment & Plan 03/04/2013 Office Visit Written 03/04/2013  3:34 PM by Yehuda Savannah, MD    Blood pressure control is adequate today, but suboptimal based upon patient's measurements. 24-hour ambulatory monitoring will be performed with subsequent adjustment of medication if necessary. Diltiazem will be added to her medical regime in an attempt to control tachycardia as well as hypertension.      Thoracic aortic aneurysm [facility])   TIA (transient ischemic attack)     Respiratory   Asthma, chronic   Last Assessment & Plan 03/04/2013 Office Visit Written 03/04/2013  3:33 PM by Yehuda Savannah, MD    No recent symptoms to suggest active asthma.      Obstructive sleep apnea   COPD (chronic obstructive pulmonary disease) ([facility])   COPD exacerbation ([facility])   Acute respiratory failure with hypoxia ([facility])   Hypoxia   Chronic obstructive pulmonary disease [facility])     Musculoskeletal and Integument   Skin eruption   Last Assessment & Plan 01/30/2013 Office Visit Written 02/05/2013 10:25 PM by Yehuda Savannah, MD    Papular rash over the malar eminences. Due to its very restricted distribution, doubt that this is a drug rash, but furosemide will be discontinued for now with substitution of chlorthalidone.  Seborrhea, rosacea and atopic dermatitis are among the diagnostic possibilities. Patient  advised to use an OTC steroid cream and to seek attention from a dermatologist if rash persists.         Genitourinary   UTI (lower urinary tract infection)     Other   Morbid obesity [facility])   Last Assessment & Plan 01/09/2013 Office Visit Written 01/09/2013  2:34 PM by Yehuda Savannah, MD    Weight loss and exercise advised.      Chest pain   Difficulty speaking   Slurred speech   Chronic back pain       Imaging: Dg Chest Portable 1 View  02/06/2016  CLINICAL DATA:  Shortness of breath.  Duration: Earlier today. EXAM: PORTABLE CHEST 1 VIEW COMPARISON:  06/24/2015 FINDINGS: The patient is rotated to the right on today's radiograph, reducing diagnostic sensitivity and specificity. Mildly elevated right hemidiaphragm with suspected atelectasis along the right hemidiaphragm. The left lung appears clear. Thoracic spondylosis noted. IMPRESSION: 1. Borderline cardiomegaly. 2. Mildly elevated right hemidiaphragm with suspected atelectasis along the right hemidiaphragm. 3. Thoracic spondylosis. Electronically Signed   By: Van Clines M.D.   On: 02/06/2016 16:39

## 2016-02-22 NOTE — Progress Notes (Signed)
Name: Sabrina Curry    DOB: [birth year]  Age: 65 y.o.  MR#: [id number]       PCP:  Rosita Fire, MD      Insurance: Payor: MEDICAID [place] / Plan: MEDICAID  [place] ACCESS / Product Type: *No Product type* /   CC:    Chief Complaint  Patient presents with  . Hypertension    VS Filed Vitals:   02/22/16 1314  BP: 130/90  Pulse: 82  Height: 4' 10"  (1.473 m)  Weight: 253 lb (114.76 kg)  SpO2: 95%    Weights Current Weight  02/22/16 253 lb (114.76 kg)  02/09/16 254 lb 11.2 oz (115.531 kg)  01/11/16 248 lb (112.492 kg)    Blood Pressure  BP Readings from Last 3 Encounters:  02/22/16 130/90  02/09/16 137/76  01/11/16 144/84     Admit date:  (Not on file) Last encounter with RMR:  01/11/2016   Allergy Vancomycin cross reactors  Current Outpatient Prescriptions  Medication Sig Dispense Refill  . amLODipine (NORVASC) 10 MG tablet TAKE ONE TABLET BY MOUTH DAILY. 30 tablet 11  . aspirin 81 MG tablet Take 2 tablets (162 mg total) by mouth daily. 30 tablet   . chlorthalidone (HYGROTON) 25 MG tablet Take 1 tablet (25 mg total) by mouth daily. 90 tablet 3  . isosorbide mononitrate (IMDUR) 30 MG 24 hr tablet TAKE ONE TABLET BY MOUTH ONCE DAILY. 30 tablet 3  . lisinopril (PRINIVIL,ZESTRIL) 40 MG tablet Take 1 tablet (40 mg total) by mouth daily. 90 tablet 3  . metoprolol (LOPRESSOR) 100 MG tablet TAKE ONE TABLET BY MOUTH TWICE DAILY. 60 tablet 3  . pantoprazole (PROTONIX) 40 MG tablet Take 1 tablet (40 mg total) by mouth daily. 30 tablet 1  . potassium chloride (K-DUR) 10 MEQ tablet TAKE ONE TABLET BY MOUTH TWICE DAILY. 60 tablet 3  . promethazine (PHENERGAN) 25 MG tablet Take 25 mg by mouth every 6 (six) hours as needed for nausea or vomiting.    . topiramate (TOPAMAX) 25 MG capsule Take 25 mg by mouth 2 (two) times daily.    . traZODone (DESYREL) 50 MG tablet Take 50-100 mg by mouth at bedtime.    . vitamin B-12 (CYANOCOBALAMIN) 1000 MCG tablet Take 1 tablet (1,000 mcg total) by mouth  daily. 30 tablet 1  . oxyCODONE-acetaminophen (PERCOCET/ROXICET) 5-325 MG tablet Take 1 tablet by mouth daily as needed for moderate pain or severe pain.     No current facility-administered medications for this visit.    Discontinued Meds:    Medications Discontinued During This Encounter  Medication Reason  . azithromycin (ZITHROMAX) 250 MG tablet Error  . predniSONE (STERAPRED UNI-PAK 21 TAB) 10 MG (21) TBPK tablet Error    Patient Active Problem List   Diagnosis Date Noted  . COPD exacerbation ([place]) 02/06/2016  . Acute respiratory failure with hypoxia ([place]) 02/06/2016  . Hypoxia 02/06/2016  . COPD (chronic obstructive pulmonary disease) ([place])   . Chronic back pain   . Chronic obstructive pulmonary disease ([place])   . Chest pain 06/24/2015  . TIA (transient ischemic attack) 06/24/2015  . UTI (lower urinary tract infection) 06/24/2015  . Difficulty speaking   . Slurred speech   . Skin eruption 01/30/2013  . Obstructive sleep apnea   . Thoracic aortic aneurysm ([place]) 12/17/2012  . Paroxysmal supraventricular tachycardia-nonsustained 12/08/2012  . Morbid obesity ([place]) 12/08/2012  . Asthma, chronic 12/08/2012  . Hypertension 12/08/2012    LABS    Component Value Date/Time   NA  138 02/07/2016 0526   NA 141 02/06/2016 1723   NA 141 06/24/2015 0404   K 3.9 02/07/2016 0526   K 3.3* 02/06/2016 1723   K 3.8 06/24/2015 0404   CL 103 02/07/2016 0526   CL 104 02/06/2016 1723   CL 106 06/24/2015 0404   CO2 28 02/07/2016 0526   CO2 29 02/06/2016 1723   CO2 29 06/24/2015 0404   GLUCOSE 154* 02/07/2016 0526   GLUCOSE 114* 02/06/2016 1723   GLUCOSE 107* 06/24/2015 0404   BUN 14 02/07/2016 0526   BUN 12 02/06/2016 1723   BUN 14 06/24/2015 0404   CREATININE 1.04* 02/07/2016 0526   CREATININE 1.16* 02/06/2016 1723   CREATININE 0.93 06/24/2015 0404   CREATININE 1.11* 03/12/2015 1137   CREATININE 1.30* 01/21/2014 0753   CREATININE 1.17* 02/26/2013 1437   CALCIUM 8.8*  02/07/2016 0526   CALCIUM 9.2 02/06/2016 1723   CALCIUM 8.5* 06/24/2015 0404   GFRNONAA 56* 02/07/2016 0526   GFRNONAA 49* 02/06/2016 1723   GFRNONAA >60 06/24/2015 0404   GFRAA >60 02/07/2016 0526   GFRAA 57* 02/06/2016 1723   GFRAA >60 06/24/2015 0404   CMP     Component Value Date/Time   NA 138 02/07/2016 0526   K 3.9 02/07/2016 0526   CL 103 02/07/2016 0526   CO2 28 02/07/2016 0526   GLUCOSE 154* 02/07/2016 0526   BUN 14 02/07/2016 0526   CREATININE 1.04* 02/07/2016 0526   CREATININE 1.11* 03/12/2015 1137   CALCIUM 8.8* 02/07/2016 0526   PROT 7.2 06/23/2015 2340   ALBUMIN 3.7 06/23/2015 2340   AST 21 06/23/2015 2340   ALT 5* 06/23/2015 2340   ALKPHOS 66 06/23/2015 2340   BILITOT 0.5 06/23/2015 2340   GFRNONAA 56* 02/07/2016 0526   GFRAA >60 02/07/2016 0526       Component Value Date/Time   WBC 10.4 02/07/2016 0526   WBC 10.0 02/06/2016 1723   WBC 7.4 06/24/2015 0404   HGB 12.1 02/07/2016 0526   HGB 13.2 02/06/2016 1723   HGB 11.9* 06/24/2015 0404   HCT 38.1 02/07/2016 0526   HCT 42.2 02/06/2016 1723   HCT 37.8 06/24/2015 0404   MCV 95.7 02/07/2016 0526   MCV 97.2 02/06/2016 1723   MCV 95.7 06/24/2015 0404    Lipid Panel     Component Value Date/Time   CHOL 128 06/24/2015 0404   TRIG 79 06/24/2015 0404   HDL 35* 06/24/2015 0404   CHOLHDL 3.7 06/24/2015 0404   VLDL 16 06/24/2015 0404   LDLCALC 77 06/24/2015 0404    ABG No results found for: PHART, PCO2ART, PO2ART, HCO3, TCO2, ACIDBASEDEF, O2SAT   Lab Results  Component Value Date   TSH 1.551 06/25/2015   BNP (last 3 results)  Recent Labs  02/06/16 1723  BNP 172.0*    ProBNP (last 3 results) No results for input(s): PROBNP in the last 8760 hours.  Cardiac Panel (last 3 results) No results for input(s): CKTOTAL, CKMB, TROPONINI, RELINDX in the last 72 hours.  Iron/TIBC/Ferritin/ %Sat No results found for: IRON, TIBC, FERRITIN, IRONPCTSAT   EKG Orders placed or performed during the  hospital encounter of 02/06/16  . EKG 12-Lead  . EKG 12-Lead     Prior Assessment and Plan Problem List as of 02/22/2016      Cardiovascular and Mediastinum   Paroxysmal supraventricular tachycardia-nonsustained   Last Assessment & Plan 03/04/2013 Office Visit Written 03/04/2013  3:35 PM by Yehuda Savannah, MD    Symptoms not controlled  with moderate dose beta blocker. Patient has a borderline first degree AV block and intermittent mild sinus bradycardia.  Low dose diltiazem will be added with monitoring of vital signs and periodic assessment of electrocardiographic intervals.      Hypertension   Last Assessment & Plan 03/04/2013 Office Visit Written 03/04/2013  3:34 PM by Yehuda Savannah, MD    Blood pressure control is adequate today, but suboptimal based upon patient's measurements. 24-hour ambulatory monitoring will be performed with subsequent adjustment of medication if necessary. Diltiazem will be added to her medical regime in an attempt to control tachycardia as well as hypertension.      Thoracic aortic aneurysm [facility])   TIA (transient ischemic attack)     Respiratory   Asthma, chronic   Last Assessment & Plan 03/04/2013 Office Visit Written 03/04/2013  3:33 PM by Yehuda Savannah, MD    No recent symptoms to suggest active asthma.      Obstructive sleep apnea   COPD (chronic obstructive pulmonary disease) (H[facility])   COPD exacerbation ([facility])   Acute respiratory failure with hypoxia (H[facility])   Hypoxia   Chronic obstructive pulmonary disease [facility])     Musculoskeletal and Integument   Skin eruption   Last Assessment & Plan 01/30/2013 Office Visit Written 02/05/2013 10:25 PM by Yehuda Savannah, MD    Papular rash over the malar eminences. Due to its very restricted distribution, doubt that this is a drug rash, but furosemide will be discontinued for now with substitution of chlorthalidone.  Seborrhea, rosacea and atopic dermatitis are among the diagnostic possibilities. Patient  advised to use an OTC steroid cream and to seek attention from a dermatologist if rash persists.         Genitourinary   UTI (lower urinary tract infection)     Other   Morbid obesity [facility])   Last Assessment & Plan 01/09/2013 Office Visit Written 01/09/2013  2:34 PM by Yehuda Savannah, MD    Weight loss and exercise advised.      Chest pain   Difficulty speaking   Slurred speech   Chronic back pain       Imaging: Dg Chest Portable 1 View  02/06/2016  CLINICAL DATA:  Shortness of breath.  Duration: Earlier today. EXAM: PORTABLE CHEST 1 VIEW COMPARISON:  06/24/2015 FINDINGS: The patient is rotated to the right on today's radiograph, reducing diagnostic sensitivity and specificity. Mildly elevated right hemidiaphragm with suspected atelectasis along the right hemidiaphragm. The left lung appears clear. Thoracic spondylosis noted. IMPRESSION: 1. Borderline cardiomegaly. 2. Mildly elevated right hemidiaphragm with suspected atelectasis along the right hemidiaphragm. 3. Thoracic spondylosis. Electronically Signed   By: Van Clines M.D.   On: 02/06/2016 16:39

## 2016-02-22 NOTE — Patient Instructions (Signed)
Your physician recommends that you schedule a follow-up appointment in: 3 Months with Dr. Bronson Ing  Your physician recommends that you continue on your current medications as directed. Please refer to the Current Medication list given to you today.  If you need a refill on your cardiac medications before your next appointment, please call your pharmacy.  Thank you for choosing [facility]!

## 2016-02-24 NOTE — Discharge Summary (Signed)
Physician Discharge Summary  Patient ID: Sabrina Curry MRN: [id number] DOB/AGE: [birth year] 65 y.o. Primary Care Physician:Kynli Chou, MD Admit date: 02/06/2016 Discharge date: 02/09/2016   Discharge Diagnoses:   Principal Problem:   Acute respiratory failure with hypoxia [facility]) Active Problems:   Paroxysmal supraventricular tachycardia-nonsustained   Morbid obesity ([place])   Hypertension   Obstructive sleep apnea   COPD exacerbation ([place])   Hypoxia     Medication List    TAKE these medications        amLODipine 10 MG tablet  Commonly known as:  NORVASC  TAKE ONE TABLET BY MOUTH DAILY.     aspirin 81 MG tablet  Take 2 tablets (162 mg total) by mouth daily.     chlorthalidone 25 MG tablet  Commonly known as:  HYGROTON  Take 1 tablet (25 mg total) by mouth daily.     isosorbide mononitrate 30 MG 24 hr tablet  Commonly known as:  IMDUR  TAKE ONE TABLET BY MOUTH ONCE DAILY.     lisinopril 40 MG tablet  Commonly known as:  PRINIVIL,ZESTRIL  Take 1 tablet (40 mg total) by mouth daily.     metoprolol 100 MG tablet  Commonly known as:  LOPRESSOR  TAKE ONE TABLET BY MOUTH TWICE DAILY.     oxyCODONE-acetaminophen 5-325 MG tablet  Commonly known as:  PERCOCET/ROXICET  Take 1 tablet by mouth daily as needed for moderate pain or severe pain.     pantoprazole 40 MG tablet  Commonly known as:  PROTONIX  Take 1 tablet (40 mg total) by mouth daily.     potassium chloride 10 MEQ tablet  Commonly known as:  K-DUR  TAKE ONE TABLET BY MOUTH TWICE DAILY.     traZODone 50 MG tablet  Commonly known as:  DESYREL  Take 50-100 mg by mouth at bedtime.     vitamin B-12 1000 MCG tablet  Commonly known as:  CYANOCOBALAMIN  Take 1 tablet (1,000 mcg total) by mouth daily.        Discharged Condition: improved    Consults: pulmonary  Significant Diagnostic Studies: Dg Chest Portable 1 View  02/06/2016  CLINICAL DATA:  Shortness of breath.  Duration: Earlier today. EXAM:  PORTABLE CHEST 1 VIEW COMPARISON:  06/24/2015 FINDINGS: The patient is rotated to the right on today's radiograph, reducing diagnostic sensitivity and specificity. Mildly elevated right hemidiaphragm with suspected atelectasis along the right hemidiaphragm. The left lung appears clear. Thoracic spondylosis noted. IMPRESSION: 1. Borderline cardiomegaly. 2. Mildly elevated right hemidiaphragm with suspected atelectasis along the right hemidiaphragm. 3. Thoracic spondylosis. Electronically Signed   By: Van Clines M.D.   On: 02/06/2016 16:39    Lab Results: Basic Metabolic Panel: No results for input(s): NA, K, CL, CO2, GLUCOSE, BUN, CREATININE, CALCIUM, MG, PHOS in the last 72 hours. Liver Function Tests: No results for input(s): AST, ALT, ALKPHOS, BILITOT, PROT, ALBUMIN in the last 72 hours.   CBC: No results for input(s): WBC, NEUTROABS, HGB, HCT, MCV, PLT in the last 72 hours.  No results found for this or any previous visit (from the past 240 hour(s)).   Hospital Course:   This a 65 years old female with history of multiple medical illnesses was admitted due to shortness of breath secondary to acute exacerbation of COPD. Patient was treated with iv steroid, nebulizer and and antibiotics. Patient was evaluated by pulmonologist. Patient improved and discharge  On oral antibiotics and home oxygen to be followed in out patient.  Discharge Exam: Blood pressure  137/76, pulse 61, temperature 98.2 F (36.8 C), temperature source Oral, resp. rate 20, height 4' 10"  (1.473 m), weight 115.531 kg (254 lb 11.2 oz), SpO2 94 %.    Disposition:  Home        Follow-up Information    Follow up with [facility], MD On 02/16/2016.   Specialty:  Internal Medicine   Why:  at 8:50 am   Contact information:   [place] [address] [phone]       Signed: Rosita Fire   02/24/2016, 8:06 AM

## 2016-03-15 ENCOUNTER — Ambulatory Visit (INDEPENDENT_AMBULATORY_CARE_PROVIDER_SITE_OTHER): Payer: Medicaid Other | Admitting: Cardiothoracic Surgery

## 2016-03-15 VITALS — BP 134/98 | HR 93 | Resp 16 | Ht <= 58 in | Wt 263.0 lb

## 2016-03-15 DIAGNOSIS — I712 Thoracic aortic aneurysm, without rupture: Secondary | ICD-10-CM

## 2016-03-15 DIAGNOSIS — I10 Essential (primary) hypertension: Secondary | ICD-10-CM | POA: Diagnosis not present

## 2016-03-15 DIAGNOSIS — I7121 Aneurysm of the ascending aorta, without rupture: Secondary | ICD-10-CM

## 2016-03-15 NOTE — Progress Notes (Signed)
PCP is Rosita Fire, MD Referring Provider is Delfina Redwood, MD  Chief Complaint  Patient presents with  . 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 The patient has hypertension and is on multiple medications. She states she is compliant with her medication schedule. She has had a fusiform aneurysm since 2014. He does not show significant change in size despite her hypertension. The patient is morbidly obese. Last month she was admitted to an outside hospital and placed on home oxygen for chronic lung disease. She denies smoking.  Review of the CT scan taken recently shows the ascending aorta remained at 4.4 cm without mural thickening or penetrating ulceration. The descending thoracic aorta appears of normal caliber. No significant at risk pulmonary nodules noted  The patient had a transthoracic 2-D echocardiogram last  Month which I personally reviewed. She has normal LV function without significant  aortic valve disease or MR. The aortic root appears to be only mildly enlarged. Past Medical History  Diagnosis Date  . Asthma   . Anxiety   . GERD (gastroesophageal reflux disease)   . Headache(784.0)   . Arthritis   . Chronic back pain   . Hypertension   . Obstructive sleep apnea     On CPAP by nasal prong  . Thoracic aortic aneurysm [facility]) 12/2012     12/2012: 4.3 cm fusiform aneurysm-ascending thoracic aorta  . Palpitations 12/2012    ? SVT  . H/O dizziness   . Seasonal allergies   . Hx of migraines   . Knee pain, bilateral   . H/O shortness of breath   . COPD (chronic obstructive pulmonary disease) ([place])   . Stroke [facility])     last stoke Oct 2016    Past Surgical History  Procedure Laterality Date  . Abdominal hysterectomy    . Cholecystectomy      [facility]  . Back surgery      had staff infection of back  . Umbilical hernia repair     . Hernia repair  2009    inscional hernia repair x2-3  . Appendectomy      with gallbladder removal  . Cataract extraction w/phaco  01/16/2012    Procedure: CATARACT EXTRACTION PHACO AND INTRAOCULAR LENS PLACEMENT (IOC);  Surgeon: Elta Guadeloupe T. Gershon Crane, MD;  Location: [facility];  Service: Ophthalmology;  Laterality: Right;  CDE 6.12    Family History  Problem Relation Age of Onset  . Anesthesia problems Neg Hx   . Hypotension Neg Hx   . Pseudochol deficiency Neg Hx   . Malignant hyperthermia Neg Hx   . Hypertension Mother   . Diabetes Mother   . Hyperlipidemia Father     Also hypertension and an aneurysm    Social History Social History  Substance Use Topics  . Smoking status: Never Smoker   . Smokeless tobacco: Never Used  . Alcohol Use: No    Current Outpatient Prescriptions  Medication Sig Dispense Refill  . albuterol (PROAIR HFA) 108 (90 Base) MCG/ACT inhaler Inhale 1 puff into the lungs every 6 (six) hours as needed for wheezing or shortness of breath.    [name] Kitchen amLODipine (NORVASC) 10 MG tablet TAKE ONE TABLET BY MOUTH DAILY. 30 tablet 11  . aspirin 81 MG tablet Take 2 tablets (162 mg total) by mouth daily. 30 tablet   . budesonide-formoterol (SYMBICORT) 160-4.5 MCG/ACT inhaler Inhale  2 puffs into the lungs daily.    . chlorthalidone (HYGROTON) 25 MG tablet Take 1 tablet (25 mg total) by mouth daily. 90 tablet 3  . isosorbide mononitrate (IMDUR) 30 MG 24 hr tablet TAKE ONE TABLET BY MOUTH ONCE DAILY. 30 tablet 3  . lisinopril (PRINIVIL,ZESTRIL) 40 MG tablet Take 1 tablet (40 mg total) by mouth daily. 90 tablet 3  . metoprolol (LOPRESSOR) 100 MG tablet TAKE ONE TABLET BY MOUTH TWICE DAILY. 60 tablet 3  . oxyCODONE-acetaminophen (PERCOCET/ROXICET) 5-325 MG tablet Take 1 tablet by mouth daily as needed for moderate pain or severe pain.    . OXYGEN Inhale into the lungs.    . pantoprazole (PROTONIX) 40 MG tablet Take 1 tablet (40 mg total) by mouth daily. 30 tablet 1  . potassium  chloride (K-DUR) 10 MEQ tablet TAKE ONE TABLET BY MOUTH TWICE DAILY. 60 tablet 3  . promethazine (PHENERGAN) 25 MG tablet Take 25 mg by mouth every 6 (six) hours as needed for nausea or vomiting.    . topiramate (TOPAMAX) 25 MG capsule Take 25 mg by mouth 2 (two) times daily.    . traZODone (DESYREL) 50 MG tablet Take 50-100 mg by mouth at bedtime.    . vitamin B-12 (CYANOCOBALAMIN) 1000 MCG tablet Take 1 tablet (1,000 mcg total) by mouth daily. 30 tablet 1   No current facility-administered medications for this visit.    Allergies  Allergen Reactions  . Vancomycin Cross Reactors Anaphylaxis    Review of Systems   The patient is morbidly obese in a wheelchair and has very poor mobility She complains of chronic bilateral ankle swelling left greater than right   BP 134/98 mmHg  Pulse 93  Resp 16  Ht 4' 10"  (1.473 m)  Wt 263 lb (119.296 kg)  BMI 54.98 kg/m2  SpO2 93% Physical Exam      Exam    General- alert and comfortable, morbidly obese in a wheelchair   Lungs- clear without rales, wheezes breath sounds distant   Cor- regular rate and rhythm, no murmur , gallop   Abdomen- soft, non-tender   Extremities - warm, non-tender, minimal edema   Neuro- oriented, appropriate, no focal weakness   Diagnostic Tests: CT scan performed last month personally reviewed with patient. The fusiform 4.4 cm ascending aneurysm is stable and has not changed since 2014  Impression: Severe hypertension managed with multiple medications Stable 4.4 cm ascending fusiform aneurysm, asymptomatic Chronic lung disease on home oxygen Plan: Continue serial  surveillance CT scans. Surgical intervention is not indicated. The patient would be very poor candidate for surgery and may not even be operable. Best therapy is prevention with blood pressure control and she understands this.  Len Childs, MD [facility] [phone]

## 2016-04-12 ENCOUNTER — Other Ambulatory Visit: Payer: Self-pay | Admitting: Cardiovascular Disease

## 2016-05-06 LAB — BASIC METABOLIC PANEL: GLUCOSE: 103 mg/dL

## 2016-06-05 ENCOUNTER — Ambulatory Visit: Payer: Medicaid Other | Admitting: Cardiovascular Disease

## 2016-06-16 ENCOUNTER — Encounter: Payer: Self-pay | Admitting: Cardiovascular Disease

## 2016-06-16 ENCOUNTER — Ambulatory Visit (INDEPENDENT_AMBULATORY_CARE_PROVIDER_SITE_OTHER): Payer: Medicaid Other | Admitting: Cardiovascular Disease

## 2016-06-16 VITALS — BP 160/100 | HR 81 | Wt 250.0 lb

## 2016-06-16 DIAGNOSIS — I10 Essential (primary) hypertension: Secondary | ICD-10-CM

## 2016-06-16 DIAGNOSIS — R0600 Dyspnea, unspecified: Secondary | ICD-10-CM | POA: Diagnosis not present

## 2016-06-16 DIAGNOSIS — R0689 Other abnormalities of breathing: Secondary | ICD-10-CM

## 2016-06-16 DIAGNOSIS — R079 Chest pain, unspecified: Secondary | ICD-10-CM

## 2016-06-16 DIAGNOSIS — I712 Thoracic aortic aneurysm, without rupture, unspecified: Secondary | ICD-10-CM

## 2016-06-16 DIAGNOSIS — I471 Supraventricular tachycardia, unspecified: Secondary | ICD-10-CM

## 2016-06-16 DIAGNOSIS — G459 Transient cerebral ischemic attack, unspecified: Secondary | ICD-10-CM

## 2016-06-16 MED ORDER — HYDRALAZINE HCL 25 MG PO TABS: 25.0000 mg | ORAL_TABLET | Freq: Three times a day (TID) | ORAL | 3 refills | Status: DC

## 2016-06-16 NOTE — Patient Instructions (Addendum)
Your physician recommends that you schedule a follow-up appointment in: 3 months       START Hydralazine 25 mg three times a day      Thank you for choosing [facility] !

## 2016-06-16 NOTE — Progress Notes (Signed)
SUBJECTIVE: The patient has a history of generalized anxiety disorder, PSVT, malignant hypertension, thoracic aortic aneurysm and chest pain.   She has occasional chest pains but has a chronic headache. She checks her blood pressure at home and it has been as high as 173/110. When it gets that high is when she experiences chest pain and shortness of breath. She uses 2 L of oxygen 24/7.   Review of Systems: As per "subjective", otherwise negative.  Allergies  Allergen Reactions  . Vancomycin Cross Reactors Anaphylaxis    Current Outpatient Prescriptions  Medication Sig Dispense Refill  . albuterol (PROAIR HFA) 108 (90 Base) MCG/ACT inhaler Inhale 1 puff into the lungs every 6 (six) hours as needed for wheezing or shortness of breath.    [name] Kitchen amLODipine (NORVASC) 10 MG tablet TAKE ONE TABLET BY MOUTH DAILY. 30 tablet 11  . aspirin 81 MG tablet Take 2 tablets (162 mg total) by mouth daily. 30 tablet   . budesonide-formoterol (SYMBICORT) 160-4.5 MCG/ACT inhaler Inhale 2 puffs into the lungs daily.    . chlorthalidone (HYGROTON) 25 MG tablet Take 1 tablet (25 mg total) by mouth daily. 90 tablet 3  . chlorthalidone (HYGROTON) 25 MG tablet TAKE ONE TABLET BY MOUTH DAILY. 30 tablet 3  . isosorbide mononitrate (IMDUR) 30 MG 24 hr tablet TAKE ONE TABLET BY MOUTH ONCE DAILY. 30 tablet 3  . lisinopril (PRINIVIL,ZESTRIL) 40 MG tablet Take 1 tablet (40 mg total) by mouth daily. 90 tablet 3  . metoprolol (LOPRESSOR) 100 MG tablet TAKE ONE TABLET BY MOUTH TWICE DAILY. 60 tablet 3  . oxyCODONE-acetaminophen (PERCOCET/ROXICET) 5-325 MG tablet Take 1 tablet by mouth daily as needed for moderate pain or severe pain.    . OXYGEN Inhale into the lungs.    . pantoprazole (PROTONIX) 40 MG tablet Take 1 tablet (40 mg total) by mouth daily. 30 tablet 1  . potassium chloride (K-DUR) 10 MEQ tablet TAKE ONE TABLET BY MOUTH TWICE DAILY. 60 tablet 3  . promethazine (PHENERGAN) 25 MG tablet Take 25 mg by mouth  every 6 (six) hours as needed for nausea or vomiting.    . topiramate (TOPAMAX) 25 MG capsule Take 25 mg by mouth 2 (two) times daily.    . traZODone (DESYREL) 50 MG tablet Take 50-100 mg by mouth at bedtime.    . vitamin B-12 (CYANOCOBALAMIN) 1000 MCG tablet Take 1 tablet (1,000 mcg total) by mouth daily. 30 tablet 1   No current facility-administered medications for this visit.     Past Medical History:  Diagnosis Date  . Anxiety   . Arthritis   . Asthma   . Chronic back pain   . COPD (chronic obstructive pulmonary disease) ([place])   . GERD (gastroesophageal reflux disease)   . H/O dizziness   . H/O shortness of breath   . Headache(784.0)   . Hx of migraines   . Hypertension   . Knee pain, bilateral   . Obstructive sleep apnea    On CPAP by nasal prong  . Palpitations 12/2012   ? SVT  . Seasonal allergies   . Stroke [facility])    last stoke Oct 2016  . Thoracic aortic aneurysm [facility]) 12/2012    12/2012: 4.3 cm fusiform aneurysm-ascending thoracic aorta    Past Surgical History:  Procedure Laterality Date  . ABDOMINAL HYSTERECTOMY    . APPENDECTOMY     with gallbladder removal  . BACK SURGERY     had staff infection of  back  . CATARACT EXTRACTION W/PHACO  01/16/2012   Procedure: CATARACT EXTRACTION PHACO AND INTRAOCULAR LENS PLACEMENT (IOC);  Surgeon: Elta Guadeloupe T. Gershon Crane, MD;  Location: [facility];  Service: Ophthalmology;  Laterality: Right;  CDE 6.12  . CHOLECYSTECTOMY     [facility]  . HERNIA REPAIR  2009   inscional hernia repair x2-3  . UMBILICAL HERNIA REPAIR      Social History   Social History  . Marital status: Married    Spouse name: N/A  . Number of children: N/A  . Years of education: N/A   Occupational History  . CNA-retired     [employer]   Social History Main Topics  . Smoking status: Never Smoker  . Smokeless tobacco: Never Used  . Alcohol use No  . Drug use: No  . Sexual activity: Yes    Birth control/ protection: Post-menopausal, Surgical     Other Topics Concern  . Not on file   Social History Narrative  . No narrative on file     Vitals:   06/16/16 1315  BP: (!) 160/100  Pulse: 81  SpO2: 96%  Weight: 250 lb (113.4 kg)    PHYSICAL EXAM General: NAD HEENT: Normal. Neck: No JVD, no thyromegaly. Lungs: Clear to auscultation bilaterally with normal respiratory effort. CV: Nondisplaced PMI.  Regular rate and rhythm, normal S1/S2, no S3/S4, no murmur. No pretibial or periankle edema.  No carotid bruit.   Abdomen: Morbidly obese.  Neurologic: Alert and oriented.  Psych: Normal affect. Skin: Normal. Musculoskeletal: No gross deformities.    ECG: Most recent ECG reviewed.      ASSESSMENT AND PLAN: 1. Chest pain: Symptoms appear to be stable. Aim to control BP. Normal dobutamine stress echocardiogram on 07-10-2012.   2. Malignant hypertension: Elevated. Add hydralazine 25 mg TID.  3. PSVT: Symptoms appear to be reasonably well controlled on metoprolol 100 mg twice daily. No changes.  4. Thoracic aortic aneurysm: Followed by CT surgery. Remains mild when most recently evaluated at 4.4 cm in 06/24/2015.   5. TIA: Continue ASA. Aggressive BP control warranted.  Dispo: fu 2 months.   Kate Sable, M.D., F.A.C.C.

## 2016-08-15 ENCOUNTER — Other Ambulatory Visit: Payer: Self-pay | Admitting: Adult Health

## 2016-09-01 ENCOUNTER — Ambulatory Visit (INDEPENDENT_AMBULATORY_CARE_PROVIDER_SITE_OTHER): Payer: Medicaid Other | Admitting: Cardiovascular Disease

## 2016-09-01 ENCOUNTER — Encounter: Payer: Self-pay | Admitting: Cardiovascular Disease

## 2016-09-01 VITALS — BP 176/94 | HR 85 | Ht 59.0 in | Wt 268.0 lb

## 2016-09-01 DIAGNOSIS — I8390 Asymptomatic varicose veins of unspecified lower extremity: Secondary | ICD-10-CM | POA: Diagnosis not present

## 2016-09-01 DIAGNOSIS — I712 Thoracic aortic aneurysm, without rupture, unspecified: Secondary | ICD-10-CM

## 2016-09-01 DIAGNOSIS — R600 Localized edema: Secondary | ICD-10-CM | POA: Diagnosis not present

## 2016-09-01 DIAGNOSIS — I10 Essential (primary) hypertension: Secondary | ICD-10-CM | POA: Diagnosis not present

## 2016-09-01 DIAGNOSIS — I471 Supraventricular tachycardia, unspecified: Secondary | ICD-10-CM

## 2016-09-01 MED ORDER — HYDRALAZINE HCL 50 MG PO TABS: 50.0000 mg | ORAL_TABLET | Freq: Three times a day (TID) | ORAL | 3 refills | Status: DC

## 2016-09-01 NOTE — Progress Notes (Signed)
SUBJECTIVE: The patient has a history of generalized anxiety disorder, PSVT, malignant hypertension, thoracic aortic aneurysm and chest pain.   Echocardiogram 02/07/16: Normal left ventricular systolic and diastolic function, LVEF 94-49%, mild mitral regurgitation, mildly dilated ascending aorta.  She is not wearing her oxygen today.  Seldom has chest pains. Primary complaint is bilateral calf and ankle swelling and venous varicosities. Has chronic exertional dyspnea.   Review of Systems: As per "subjective", otherwise negative.  Allergies  Allergen Reactions  . Vancomycin Cross Reactors Anaphylaxis    Current Outpatient Prescriptions  Medication Sig Dispense Refill  . albuterol (PROAIR HFA) 108 (90 Base) MCG/ACT inhaler Inhale 1 puff into the lungs every 6 (six) hours as needed for wheezing or shortness of breath.    [name] Kitchen amLODipine (NORVASC) 10 MG tablet TAKE ONE TABLET BY MOUTH DAILY. 30 tablet 11  . aspirin 81 MG tablet Take 2 tablets (162 mg total) by mouth daily. 30 tablet   . budesonide-formoterol (SYMBICORT) 160-4.5 MCG/ACT inhaler Inhale 2 puffs into the lungs daily.    . chlorthalidone (HYGROTON) 25 MG tablet Take 1 tablet (25 mg total) by mouth daily. 90 tablet 3  . chlorthalidone (HYGROTON) 25 MG tablet TAKE ONE TABLET BY MOUTH DAILY. 30 tablet 3  . hydrALAZINE (APRESOLINE) 25 MG tablet Take 1 tablet (25 mg total) by mouth 3 (three) times daily. 270 tablet 3  . isosorbide mononitrate (IMDUR) 30 MG 24 hr tablet TAKE ONE TABLET BY MOUTH ONCE DAILY. 30 tablet 6  . lisinopril (PRINIVIL,ZESTRIL) 40 MG tablet Take 1 tablet (40 mg total) by mouth daily. 90 tablet 3  . metoprolol (LOPRESSOR) 100 MG tablet TAKE ONE TABLET BY MOUTH TWICE DAILY. 60 tablet 3  . oxyCODONE-acetaminophen (PERCOCET/ROXICET) 5-325 MG tablet Take 1 tablet by mouth daily as needed for moderate pain or severe pain.    . OXYGEN Inhale into the lungs.    . pantoprazole (PROTONIX) 40 MG tablet Take 1 tablet  (40 mg total) by mouth daily. 30 tablet 1  . potassium chloride (K-DUR) 10 MEQ tablet TAKE ONE TABLET BY MOUTH TWICE DAILY. 60 tablet 6  . promethazine (PHENERGAN) 25 MG tablet Take 25 mg by mouth every 6 (six) hours as needed for nausea or vomiting.    . topiramate (TOPAMAX) 25 MG capsule Take 25 mg by mouth 2 (two) times daily.    . traZODone (DESYREL) 50 MG tablet Take 50-100 mg by mouth at bedtime.    . vitamin B-12 (CYANOCOBALAMIN) 1000 MCG tablet Take 1 tablet (1,000 mcg total) by mouth daily. 30 tablet 1   No current facility-administered medications for this visit.     Past Medical History:  Diagnosis Date  . Anxiety   . Arthritis   . Asthma   . Chronic back pain   . COPD (chronic obstructive pulmonary disease) ([place])   . GERD (gastroesophageal reflux disease)   . H/O dizziness   . H/O shortness of breath   . Headache(784.0)   . Hx of migraines   . Hypertension   . Knee pain, bilateral   . Obstructive sleep apnea    On CPAP by nasal prong  . Palpitations 12/2012   ? SVT  . Seasonal allergies   . Stroke [facility])    last stoke Oct 2016  . Thoracic aortic aneurysm [facility]) 12/2012    12/2012: 4.3 cm fusiform aneurysm-ascending thoracic aorta    Past Surgical History:  Procedure Laterality Date  . ABDOMINAL HYSTERECTOMY    .  APPENDECTOMY     with gallbladder removal  . BACK SURGERY     had staff infection of back  . CATARACT EXTRACTION W/PHACO  01/16/2012   Procedure: CATARACT EXTRACTION PHACO AND INTRAOCULAR LENS PLACEMENT (IOC);  Surgeon: Elta Guadeloupe T. Gershon Crane, MD;  Location: [facility];  Service: Ophthalmology;  Laterality: Right;  CDE 6.12  . CHOLECYSTECTOMY     [facility]  . HERNIA REPAIR  2009   inscional hernia repair x2-3  . UMBILICAL HERNIA REPAIR      Social History   Social History  . Marital status: Married    Spouse name: N/A  . Number of children: N/A  . Years of education: N/A   Occupational History  . CNA-retired     [employer]   Social  History Main Topics  . Smoking status: Never Smoker  . Smokeless tobacco: Never Used  . Alcohol use No  . Drug use: No  . Sexual activity: Yes    Birth control/ protection: Post-menopausal, Surgical   Other Topics Concern  . Not on file   Social History Narrative  . No narrative on file     Vitals:   09/01/16 1301  BP: (!) 176/94  Pulse: 85  SpO2: 92%  Weight: 268 lb (121.6 kg)  Height: 4' 11"  (1.499 m)    PHYSICAL EXAM General: NAD HEENT: Normal. Neck: No JVD, no thyromegaly. Lungs: Clear to auscultation bilaterally with normal respiratory effort. CV: Nondisplaced PMI.  Regular rate and rhythm, normal S1/S2, no S3/S4, no murmur. 1+ left periankle edema. Bilateral venous varicosities. Abdomen: Morbidly obese.  Neurologic: Alert and oriented.  Psych: Normal affect. Skin: Normal.      ECG: Most recent ECG reviewed.      ASSESSMENT AND PLAN: 1. Chest pain: Symptoms appear to be stable. Aim to control BP. Normal dobutamine stress echocardiogram on 07-10-2012.   2. Malignant hypertension: Elevated. Will increase hydralazine to 50 mg TID.  3. PSVT: Symptoms appear to be reasonably well controlled on metoprolol 100 mg twice daily. No changes.  4. Thoracic aortic aneurysm: Followed by CT surgery. Remains mild when most recently evaluated at 4.4 cm in 06/24/2015.   5. TIA: Continue ASA. Aggressive BP control warranted.  6. Bilateral venous varicosities and left ankle swelling: Will obtain b/l venous Dopplers to exclude DVT. Will make a referral to vascular surgery.  Dispo: fu 6 months.   Kate Sable, M.D., F.A.C.C.

## 2016-09-01 NOTE — Patient Instructions (Signed)
Your physician wants you to follow-up in: 6 months Dr Virgina Jock will receive a reminder letter in the mail two months in advance. If you don't receive a letter, please call our office to schedule the follow-up appointment.    INCREASE Hydralazine to 50 mg three times a day    Your physician has requested that you have a lower or upper extremity venous duplex. This test is an ultrasound of the veins in the legs or arms. It looks at venous blood flow that carries blood from the heart to the legs or arms. Allow one hour for a Lower Venous exam. Allow thirty minutes for an Upper Venous exam. There are no restrictions or special instructions.    You have been referred to Vascular Surgery [address]   [place] [address]    [phone]     Thank you for choosing [facility] !

## 2016-09-21 ENCOUNTER — Ambulatory Visit: Payer: Medicaid Other

## 2016-09-21 DIAGNOSIS — R600 Localized edema: Secondary | ICD-10-CM

## 2016-10-05 ENCOUNTER — Encounter: Payer: Self-pay | Admitting: Vascular Surgery

## 2016-10-09 ENCOUNTER — Encounter: Payer: Medicaid Other | Admitting: Vascular Surgery

## 2016-10-10 ENCOUNTER — Encounter: Payer: Self-pay | Admitting: Vascular Surgery

## 2016-10-10 ENCOUNTER — Ambulatory Visit (INDEPENDENT_AMBULATORY_CARE_PROVIDER_SITE_OTHER): Payer: Medicaid Other | Admitting: Vascular Surgery

## 2016-10-10 VITALS — BP 148/101 | HR 101 | Temp 99.1°F | Resp 16 | Ht 59.0 in | Wt 265.0 lb

## 2016-10-10 DIAGNOSIS — M7989 Other specified soft tissue disorders: Secondary | ICD-10-CM | POA: Diagnosis not present

## 2016-10-10 DIAGNOSIS — I8393 Asymptomatic varicose veins of bilateral lower extremities: Secondary | ICD-10-CM | POA: Diagnosis not present

## 2016-10-10 DIAGNOSIS — I83893 Varicose veins of bilateral lower extremities with other complications: Secondary | ICD-10-CM | POA: Insufficient documentation

## 2016-10-10 NOTE — Progress Notes (Signed)
Vitals:   10/10/16 1516  BP: (!) 147/94  Pulse: (!) 102  Resp: 16  Temp: 99.1 F (37.3 C)  SpO2: 93%  Weight: 265 lb (120.2 kg)  Height: 4' 11"  (1.499 m)

## 2016-10-10 NOTE — Progress Notes (Signed)
Subjective:     Patient ID: Sabrina Curry, female   DOB: 1950-12-08, 66 y.o.   MRN: [id number]  HPI This 66 year old female was referred by Dr.Koneswaran evaluation of bilateral varicose veins with swelling. He has had swelling in both legs for many years. She has a history of coronary artery disease with multiple symptoms, COPD, supraventricular tachycardia paroxysmal, hypertension, morbid obesity. She has no history of DVT, thrombophlebitis, stasis ulcers, or bleeding. She is not real elastic compression stockings. She cannot elevate her legs at night because of her COPD. She is on home oxygen.  Past Medical History:  Diagnosis Date  . Anxiety   . Arthritis   . Asthma   . Chronic back pain   . COPD (chronic obstructive pulmonary disease) ([place])   . GERD (gastroesophageal reflux disease)   . H/O dizziness   . H/O shortness of breath   . Headache(784.0)   . Hx of migraines   . Hypertension   . Knee pain, bilateral   . Obstructive sleep apnea    On CPAP by nasal prong  . Palpitations 12/2012   ? SVT  . Seasonal allergies   . Stroke [facility])    last stoke Oct 2016  . Thoracic aortic aneurysm [facility]) 12/2012    12/2012: 4.3 cm fusiform aneurysm-ascending thoracic aorta  . Varicose veins of both legs with edema     Social History  Substance Use Topics  . Smoking status: Never Smoker  . Smokeless tobacco: Never Used  . Alcohol use No    Family History  Problem Relation Age of Onset  . Hypertension Mother   . Diabetes Mother   . Hyperlipidemia Father     Also hypertension and an aneurysm  . Anesthesia problems Neg Hx   . Hypotension Neg Hx   . Pseudochol deficiency Neg Hx   . Malignant hyperthermia Neg Hx     Allergies  Allergen Reactions  . Vancomycin Cross Reactors Anaphylaxis     Current Outpatient Prescriptions:  .  albuterol (PROAIR HFA) 108 (90 Base) MCG/ACT inhaler, Inhale 1 puff into the lungs every 6 (six) hours as needed for wheezing or shortness of breath., Disp: ,  Rfl:  .  amLODipine (NORVASC) 10 MG tablet, TAKE ONE TABLET BY MOUTH DAILY., Disp: 30 tablet, Rfl: 11 .  aspirin 81 MG tablet, Take 2 tablets (162 mg total) by mouth daily., Disp: 30 tablet, Rfl:  .  budesonide-formoterol (SYMBICORT) 160-4.5 MCG/ACT inhaler, Inhale 2 puffs into the lungs daily., Disp: , Rfl:  .  chlorthalidone (HYGROTON) 25 MG tablet, Take 1 tablet (25 mg total) by mouth daily., Disp: 90 tablet, Rfl: 3 .  chlorthalidone (HYGROTON) 25 MG tablet, TAKE ONE TABLET BY MOUTH DAILY., Disp: 30 tablet, Rfl: 3 .  cloNIDine (CATAPRES) 0.1 MG tablet, Take 0.1 mg by mouth 2 (two) times daily., Disp: , Rfl:  .  hydrALAZINE (APRESOLINE) 50 MG tablet, Take 1 tablet (50 mg total) by mouth 3 (three) times daily., Disp: 270 tablet, Rfl: 3 .  isosorbide mononitrate (IMDUR) 30 MG 24 hr tablet, TAKE ONE TABLET BY MOUTH ONCE DAILY., Disp: 30 tablet, Rfl: 6 .  lisinopril (PRINIVIL,ZESTRIL) 40 MG tablet, Take 1 tablet (40 mg total) by mouth daily., Disp: 90 tablet, Rfl: 3 .  metoprolol (LOPRESSOR) 100 MG tablet, TAKE ONE TABLET BY MOUTH TWICE DAILY., Disp: 60 tablet, Rfl: 3 .  oxyCODONE-acetaminophen (PERCOCET/ROXICET) 5-325 MG tablet, Take 1 tablet by mouth daily as needed for moderate pain or severe pain., Disp: ,  Rfl:  .  OXYGEN, Inhale into the lungs., Disp: , Rfl:  .  pantoprazole (PROTONIX) 40 MG tablet, Take 1 tablet (40 mg total) by mouth daily., Disp: 30 tablet, Rfl: 1 .  potassium chloride (K-DUR) 10 MEQ tablet, TAKE ONE TABLET BY MOUTH TWICE DAILY., Disp: 60 tablet, Rfl: 6 .  promethazine (PHENERGAN) 25 MG tablet, Take 25 mg by mouth every 6 (six) hours as needed for nausea or vomiting., Disp: , Rfl:  .  topiramate (TOPAMAX) 25 MG capsule, Take 25 mg by mouth 2 (two) times daily., Disp: , Rfl:  .  traZODone (DESYREL) 50 MG tablet, Take 50-100 mg by mouth at bedtime., Disp: , Rfl:  .  vitamin B-12 (CYANOCOBALAMIN) 1000 MCG tablet, Take 1 tablet (1,000 mcg total) by mouth daily., Disp: 30 tablet,  Rfl: 1  Vitals:   10/10/16 1516 10/10/16 1523  BP: (!) 147/94 (!) 148/101  Pulse: (!) 102 (!) 101  Resp: 16   Temp: 99.1 F (37.3 C)   SpO2: 93%   Weight: 265 lb (120.2 kg)   Height: 4' 11"  (1.499 m)     Body mass index is 53.52 kg/m.         Review of Systems   ulcerative for chest pain, dyspnea on exertion, PND, and orthopnea. Patient has COPD and is on home oxygen. Has bilateral lower extremity edema. Objective:   Physical Exam BP (!) 148/101 (BP Location: Right Arm, Patient Position: Sitting, Cuff Size: Large)   Pulse (!) 101   Temp 99.1 F (37.3 C)   Resp 16   Ht 4' 11"  (1.499 m)   Wt 265 lb (120.2 kg)   SpO2 93%   BMI 53.52 kg/m   Gen. morbidly obese female in no apparent distress alert and oriented 3 Lungs no rhonchi or wheezing Abdomen morbidly obese Both lower extremities with occasional spider veins in the thigh and medial calf areas. 1+ edema bilaterally. No hyperpigmentation or bulging varicosities noted. 3+ dorsalis pedis pulse palpable.  Patient had a recent bilateral venous ultrasound performed January 4 and [place] which revealed no deep venous obstruction or deep or superficial venous incompetence     Assessment:     Bilateral lower extremity edema-no evidence of DVT or superficial or deep valve incompetence- Morbid obesity Coronary artery disease COPD Sleep apnea    Plan:     There is no indication for any vascular intervention on this morbidly obese female with chronic lower extremity edema She is unable to elevate foot of her bed because of her home oxygen and dyspnea on exertion and at rest I recommended short leg elastic compression stockings but have no other suggestions

## 2016-11-20 ENCOUNTER — Emergency Department (HOSPITAL_COMMUNITY): Payer: Medicaid Other

## 2016-11-20 ENCOUNTER — Observation Stay (HOSPITAL_COMMUNITY)
Admission: EM | Admit: 2016-11-20 | Discharge: 2016-11-22 | Disposition: A | Discharge status: Home / Self Care | Payer: Medicaid Other | Attending: Internal Medicine | Admitting: Internal Medicine

## 2016-11-20 ENCOUNTER — Observation Stay (HOSPITAL_COMMUNITY): Payer: Medicaid Other

## 2016-11-20 ENCOUNTER — Encounter (HOSPITAL_COMMUNITY): Payer: Self-pay | Admitting: *Deleted

## 2016-11-20 DIAGNOSIS — J449 Chronic obstructive pulmonary disease, unspecified: Secondary | ICD-10-CM | POA: Diagnosis not present

## 2016-11-20 DIAGNOSIS — J45909 Unspecified asthma, uncomplicated: Secondary | ICD-10-CM | POA: Insufficient documentation

## 2016-11-20 DIAGNOSIS — I712 Thoracic aortic aneurysm, without rupture, unspecified: Secondary | ICD-10-CM

## 2016-11-20 DIAGNOSIS — I10 Essential (primary) hypertension: Secondary | ICD-10-CM | POA: Insufficient documentation

## 2016-11-20 DIAGNOSIS — R200 Anesthesia of skin: Secondary | ICD-10-CM

## 2016-11-20 DIAGNOSIS — R079 Chest pain, unspecified: Secondary | ICD-10-CM | POA: Diagnosis not present

## 2016-11-20 DIAGNOSIS — R42 Dizziness and giddiness: Secondary | ICD-10-CM | POA: Insufficient documentation

## 2016-11-20 DIAGNOSIS — Z7982 Long term (current) use of aspirin: Secondary | ICD-10-CM | POA: Diagnosis not present

## 2016-11-20 DIAGNOSIS — Z79899 Other long term (current) drug therapy: Secondary | ICD-10-CM | POA: Diagnosis not present

## 2016-11-20 DIAGNOSIS — R072 Precordial pain: Secondary | ICD-10-CM | POA: Diagnosis present

## 2016-11-20 LAB — CBC
HCT: 39.3 % (ref 36.0–46.0)
HEMATOCRIT: 41.1 % (ref 36.0–46.0)
Hemoglobin: 13.3 g/dL (ref 12.0–15.0)
Hemoglobin: 12.7 g/dL (ref 12.0–15.0)
MCH: 30.6 pg (ref 26.0–34.0)
MCH: 30.8 pg (ref 26.0–34.0)
MCHC: 32.4 g/dL (ref 30.0–36.0)
MCHC: 32.3 g/dL (ref 30.0–36.0)
MCV: 94.5 fL (ref 78.0–100.0)
MCV: 95.2 fL (ref 78.0–100.0)
PLATELETS: 165 10*3/uL (ref 150–400)
PLATELETS: 172 10*3/uL (ref 150–400)
RBC: 4.35 MIL/uL (ref 3.87–5.11)
RBC: 4.13 MIL/uL (ref 3.87–5.11)
RDW: 13.1 % (ref 11.5–15.5)
RDW: 13.1 % (ref 11.5–15.5)
WBC: 7.5 10*3/uL (ref 4.0–10.5)
WBC: 7.5 10*3/uL (ref 4.0–10.5)

## 2016-11-20 LAB — BASIC METABOLIC PANEL
Anion gap: 6 (ref 5–15)
BUN: 15 mg/dL (ref 6–20)
CHLORIDE: 107 mmol/L (ref 101–111)
CO2: 28 mmol/L (ref 22–32)
CREATININE: 1.04 mg/dL — AB (ref 0.44–1.00)
Calcium: 9.5 mg/dL (ref 8.9–10.3)
GFR calc Af Amer: >60 mL/min (ref >60)
GFR calc non Af Amer: 56 mL/min — ABNORMAL LOW (ref >60)
GLUCOSE: 107 mg/dL — AB (ref 65–99)
POTASSIUM: 3.4 mmol/L — AB (ref 3.5–5.1)
Sodium: 141 mmol/L (ref 135–145)

## 2016-11-20 LAB — TROPONIN I
Troponin I: <0.03 ng/mL (ref <0.03)
Troponin I: <0.03 ng/mL (ref <0.03)

## 2016-11-20 LAB — CREATININE, SERUM
CREATININE: 1.11 mg/dL — AB (ref 0.44–1.00)
GFR calc Af Amer: 60 mL/min — ABNORMAL LOW (ref >60)
GFR calc non Af Amer: 52 mL/min — ABNORMAL LOW (ref >60)

## 2016-11-20 MED ORDER — LABETALOL HCL 5 MG/ML IV SOLN
10.0000 mg | Freq: Once | INTRAVENOUS | Status: AC
  Administered 2016-11-20: 10 mg via INTRAVENOUS
  Filled 2016-11-20: qty 4

## 2016-11-20 MED ORDER — FUROSEMIDE 40 MG PO TABS
40.0000 mg | ORAL_TABLET | Freq: Every day | ORAL | Status: DC
  Administered 2016-11-21: 40 mg via ORAL
  Filled 2016-11-20 (×2): qty 1

## 2016-11-20 MED ORDER — OXYCODONE-ACETAMINOPHEN 5-325 MG PO TABS
1.0000 | ORAL_TABLET | Freq: Every day | ORAL | Status: DC | PRN
  Administered 2016-11-21: 1 via ORAL
  Filled 2016-11-20: qty 1

## 2016-11-20 MED ORDER — ISOSORBIDE MONONITRATE ER 30 MG PO TB24
30.0000 mg | ORAL_TABLET | Freq: Every day | ORAL | Status: DC
  Administered 2016-11-21: 30 mg via ORAL
  Filled 2016-11-20 (×2): qty 1

## 2016-11-20 MED ORDER — ALBUTEROL SULFATE (2.5 MG/3ML) 0.083% IN NEBU: 3.0000 mL | INHALATION_SOLUTION | Freq: Four times a day (QID) | RESPIRATORY_TRACT | Status: DC | PRN

## 2016-11-20 MED ORDER — MOMETASONE FURO-FORMOTEROL FUM 200-5 MCG/ACT IN AERO
2.0000 | INHALATION_SPRAY | Freq: Two times a day (BID) | RESPIRATORY_TRACT | Status: DC
  Administered 2016-11-20 – 2016-11-22 (×4): 2 via RESPIRATORY_TRACT
  Filled 2016-11-20: qty 8.8

## 2016-11-20 MED ORDER — ACETAMINOPHEN 325 MG PO TABS
650.0000 mg | ORAL_TABLET | ORAL | Status: DC | PRN
  Administered 2016-11-21: 650 mg via ORAL
  Filled 2016-11-20: qty 2

## 2016-11-20 MED ORDER — PANTOPRAZOLE SODIUM 40 MG PO TBEC
40.0000 mg | DELAYED_RELEASE_TABLET | Freq: Every day | ORAL | Status: DC
  Administered 2016-11-21: 40 mg via ORAL
  Filled 2016-11-20 (×2): qty 1

## 2016-11-20 MED ORDER — ENOXAPARIN SODIUM 40 MG/0.4ML ~~LOC~~ SOLN
40.0000 mg | SUBCUTANEOUS | Status: DC
  Administered 2016-11-20 – 2016-11-21 (×2): 40 mg via SUBCUTANEOUS
  Filled 2016-11-20 (×2): qty 0.4

## 2016-11-20 MED ORDER — ONDANSETRON HCL 4 MG/2ML IJ SOLN: 4.0000 mg | Freq: Four times a day (QID) | INTRAMUSCULAR | Status: DC | PRN

## 2016-11-20 MED ORDER — MOMETASONE FURO-FORMOTEROL FUM 200-5 MCG/ACT IN AERO
INHALATION_SPRAY | RESPIRATORY_TRACT | Status: AC
  Filled 2016-11-20: qty 8.8

## 2016-11-20 MED ORDER — METOPROLOL TARTRATE 50 MG PO TABS
100.0000 mg | ORAL_TABLET | Freq: Two times a day (BID) | ORAL | Status: DC
  Administered 2016-11-20 – 2016-11-21 (×2): 100 mg via ORAL
  Filled 2016-11-20 (×4): qty 2

## 2016-11-20 MED ORDER — ASPIRIN 81 MG PO CHEW
162.0000 mg | CHEWABLE_TABLET | Freq: Every day | ORAL | Status: DC
  Administered 2016-11-21: 162 mg via ORAL
  Filled 2016-11-20 (×2): qty 2

## 2016-11-20 MED ORDER — LISINOPRIL 10 MG PO TABS
40.0000 mg | ORAL_TABLET | Freq: Every day | ORAL | Status: DC
  Administered 2016-11-21: 40 mg via ORAL
  Filled 2016-11-20 (×2): qty 4

## 2016-11-20 MED ORDER — HYDRALAZINE HCL 25 MG PO TABS
50.0000 mg | ORAL_TABLET | Freq: Three times a day (TID) | ORAL | Status: DC
  Administered 2016-11-20 – 2016-11-21 (×3): 50 mg via ORAL
  Filled 2016-11-20 (×5): qty 2

## 2016-11-20 MED ORDER — POTASSIUM CHLORIDE CRYS ER 10 MEQ PO TBCR
10.0000 meq | EXTENDED_RELEASE_TABLET | Freq: Two times a day (BID) | ORAL | Status: DC
  Administered 2016-11-20 – 2016-11-21 (×3): 10 meq via ORAL
  Filled 2016-11-20 (×4): qty 1

## 2016-11-20 MED ORDER — NITROGLYCERIN 0.4 MG SL SUBL
0.4000 mg | SUBLINGUAL_TABLET | SUBLINGUAL | Status: DC | PRN
  Administered 2016-11-20 (×2): 0.4 mg via SUBLINGUAL
  Filled 2016-11-20 (×2): qty 1

## 2016-11-20 MED ORDER — ENOXAPARIN SODIUM 40 MG/0.4ML ~~LOC~~ SOLN: 40.0000 mg | SUBCUTANEOUS | Status: DC

## 2016-11-20 MED ORDER — CLONIDINE HCL 0.1 MG PO TABS
0.1000 mg | ORAL_TABLET | Freq: Two times a day (BID) | ORAL | Status: DC
  Administered 2016-11-20 – 2016-11-21 (×2): 0.1 mg via ORAL
  Filled 2016-11-20 (×4): qty 1

## 2016-11-20 MED ORDER — IOPAMIDOL (ISOVUE-370) INJECTION 76%
100.0000 mL | Freq: Once | INTRAVENOUS | Status: AC | PRN
  Administered 2016-11-20: 100 mL via INTRAVENOUS

## 2016-11-20 MED ORDER — MORPHINE SULFATE (PF) 2 MG/ML IV SOLN
2.0000 mg | INTRAVENOUS | Status: DC | PRN
  Administered 2016-11-21: 2 mg via INTRAVENOUS
  Filled 2016-11-20 (×2): qty 1

## 2016-11-20 MED ORDER — AMLODIPINE BESYLATE 5 MG PO TABS
10.0000 mg | ORAL_TABLET | Freq: Every day | ORAL | Status: DC
  Administered 2016-11-20 – 2016-11-21 (×2): 10 mg via ORAL
  Filled 2016-11-20 (×3): qty 2

## 2016-11-20 NOTE — H&P (Addendum)
TRH H&P    Patient Demographics:    Sabrina Curry, is a 66 y.o. female  MRN: [id number]  DOB - 17-Jul-1951  Admit Date - 11/20/2016  Referring MD/NP/PA: Dr Alvino Chapel  Outpatient Primary MD for the patient is [facility], MD  Patient coming from: Home  Chief Complaint  Patient presents with  . Chest Pain      HPI:    Sabrina Curry  is a 66 y.o. female, With history of ascending aortic aneurysm, hypertension came to hospital with chest pain that started this morning. Patient had associated symptoms of right arm tingling, she also had some shortness of breath. In the ED CT scan of the chest was done, which showed no evidence of acute aortic dissection. Mildly aneurysmal ascending thoracic aorta with maximal diameter of 4.3 cm which is unchanged compared to 06/24/2015.  She denies nausea vomiting or diarrhea. No abdominal pain. No dysuria. No fever.     Review of systems:    I A full 10 point Review of Systems was done, except as stated above, all other Review of Systems were negative.   With Past History of the following :    Past Medical History:  Diagnosis Date  . Anxiety   . Arthritis   . Asthma   . Chronic back pain   . COPD (chronic obstructive pulmonary disease) ([place])   . GERD (gastroesophageal reflux disease)   . H/O dizziness   . H/O shortness of breath   . Headache(784.0)   . Hx of migraines   . Hypertension   . Knee pain, bilateral   . Obstructive sleep apnea    On CPAP by nasal prong  . Palpitations 12/2012   ? SVT  . Seasonal allergies   . Stroke [facility])    last stoke Oct 2016  . Thoracic aortic aneurysm [facility]) 12/2012    12/2012: 4.3 cm fusiform aneurysm-ascending thoracic aorta  . Varicose veins of both legs with edema       Past Surgical History:  Procedure Laterality Date  . ABDOMINAL HYSTERECTOMY    . APPENDECTOMY     with gallbladder removal  . BACK SURGERY     had  staff infection of back  . CATARACT EXTRACTION W/PHACO  01/16/2012   Procedure: CATARACT EXTRACTION PHACO AND INTRAOCULAR LENS PLACEMENT (IOC);  Surgeon: Elta Guadeloupe T. Gershon Crane, MD;  Location: [facility];  Service: Ophthalmology;  Laterality: Right;  CDE 6.12  . CHOLECYSTECTOMY     [facility]  . HERNIA REPAIR  2009   inscional hernia repair x2-3  . UMBILICAL HERNIA REPAIR        Social History:      Social History  Substance Use Topics  . Smoking status: Never Smoker  . Smokeless tobacco: Never Used  . Alcohol use No       Family History :     Family History  Problem Relation Age of Onset  . Hypertension Mother   . Diabetes Mother   . Hyperlipidemia Father     Also hypertension and an aneurysm  .  Anesthesia problems Neg Hx   . Hypotension Neg Hx   . Pseudochol deficiency Neg Hx   . Malignant hyperthermia Neg Hx      Home Medications:   Prior to Admission medications   Medication Sig Start Date End Date Taking? Authorizing Provider  albuterol (PROAIR HFA) 108 (90 Base) MCG/ACT inhaler Inhale 1 puff into the lungs every 6 (six) hours as needed for wheezing or shortness of breath.   Yes Historical Provider, MD  amLODipine (NORVASC) 10 MG tablet TAKE ONE TABLET BY MOUTH DAILY. 06/21/15  Yes Herminio Commons, MD  aspirin 81 MG tablet Take 2 tablets (162 mg total) by mouth daily. 06/25/15  Yes Kathie Dike, MD  budesonide-formoterol (SYMBICORT) 160-4.5 MCG/ACT inhaler Inhale 2 puffs into the lungs daily.   Yes Historical Provider, MD  cloNIDine (CATAPRES) 0.1 MG tablet Take 0.1 mg by mouth 2 (two) times daily.   Yes Historical Provider, MD  furosemide (LASIX) 40 MG tablet Take 40 mg by mouth daily.   Yes Historical Provider, MD  hydrALAZINE (APRESOLINE) 50 MG tablet Take 1 tablet (50 mg total) by mouth 3 (three) times daily. 09/01/16 11/30/16 Yes Herminio Commons, MD  isosorbide mononitrate (IMDUR) 30 MG 24 hr tablet TAKE ONE TABLET BY MOUTH ONCE DAILY. 08/15/16  Yes Herminio Commons, MD  lisinopril (PRINIVIL,ZESTRIL) 40 MG tablet Take 1 tablet (40 mg total) by mouth daily. 11/16/14  Yes Herminio Commons, MD  metoprolol (LOPRESSOR) 100 MG tablet TAKE ONE TABLET BY MOUTH TWICE DAILY. 04/19/15  Yes Herminio Commons, MD  oxyCODONE-acetaminophen (PERCOCET/ROXICET) 5-325 MG tablet Take 1 tablet by mouth daily as needed for moderate pain or severe pain.   Yes Historical Provider, MD  OXYGEN Inhale into the lungs.   Yes Historical Provider, MD  pantoprazole (PROTONIX) 40 MG tablet Take 1 tablet (40 mg total) by mouth daily. 06/25/15  Yes Kathie Dike, MD  potassium chloride (K-DUR) 10 MEQ tablet TAKE ONE TABLET BY MOUTH TWICE DAILY. 08/15/16  Yes Herminio Commons, MD  promethazine (PHENERGAN) 25 MG tablet Take 25 mg by mouth every 6 (six) hours as needed for nausea or vomiting.   Yes Historical Provider, MD  vitamin B-12 (CYANOCOBALAMIN) 1000 MCG tablet Take 1 tablet (1,000 mcg total) by mouth daily. 06/25/15  Yes Kathie Dike, MD     Allergies:     Allergies  Allergen Reactions  . Vancomycin Cross Reactors Anaphylaxis     Physical Exam:   Vitals  Blood pressure 155/88, pulse 76, temperature 99 F (37.2 C), temperature source Oral, resp. rate 21, height 4' 11"  (1.499 m), weight 113.4 kg (250 lb), SpO2 93 %.  1.  General: Appears in no acute distress  2. Psychiatric:  Intact judgement and  insight, awake alert, oriented x 3.  3. Neurologic: No focal neurological deficits, all cranial nerves intact.Strength 5/5 all 4 extremities, sensation mildly reduced in right arm, plantars down going.  4. Eyes :  anicteric sclerae, moist conjunctivae with no lid lag. PERRLA.  5. ENMT:  Oropharynx clear with moist mucous membranes and good dentition  6. Neck:  supple, no cervical lymphadenopathy appriciated, No thyromegaly  7. Respiratory : Normal respiratory effort, good air movement bilaterally,clear to  auscultation bilaterally  8. Cardiovascular  : RRR, no gallops, rubs or murmurs, no leg edema  9. Gastrointestinal:  Positive bowel sounds, abdomen soft, non-tender to palpation,no hepatosplenomegaly, no rigidity or guarding       10. Skin:  No cyanosis, normal texture  and turgor, no rash, lesions or ulcers  11.Musculoskeletal:  Good muscle tone,  joints appear normal , no effusions,  normal range of motion    Data Review:    CBC  Recent Labs Lab 11/20/16 1311  WBC 7.5  HGB 13.3  HCT 41.1  PLT 165  MCV 94.5  MCH 30.6  MCHC 32.4  RDW 13.1   ------------------------------------------------------------------------------------------------------------------  Chemistries   Recent Labs Lab 11/20/16 1311  NA 141  K 3.4*  CL 107  CO2 28  GLUCOSE 107*  BUN 15  CREATININE 1.04*  CALCIUM 9.5   ------------------------------------------------------------------------------------------------------------------  ----------------------------------------------------------------Cardiac Enzymes:  Recent Labs Lab 11/20/16 1311  TROPONINI <0.03    ------------------------------------------------------------------------------------    Imaging Results:    Dg Chest 2 View  Result Date: 11/20/2016 CLINICAL DATA:  Chest pain since yesterday. History of thoracic aortic aneurysm, morbid obesity, asthma, tachyarrhythmia is, EXAM: CHEST  2 VIEW COMPARISON:  Portable chest x-ray of Feb 06, 2016 FINDINGS: The mediastinum remains widened but has not significantly changed since the previous study. There is no pleural effusion or pneumothorax. The right lung is mildly hypoinflated. The left lung is better inflated. The heart is mildly enlarged but stable. The pulmonary vascularity is not engorged. There is multilevel degenerative disc disease of the thoracic spine. IMPRESSION: Subsegmental atelectasis or early infiltrate at the right lung base. Mild right-sided hypoinflation. Widened mediastinum which is stable. Further evaluation of  the thorax with CT scanning is recommended if there are clinical concerns of possible aortic dissection or leaking thoracic aneurysm. Electronically Signed   By: David  Martinique M.D.   On: 11/20/2016 13:04   Ct Angio Chest Aorta W And/or Wo Contrast  Result Date: 11/20/2016 CLINICAL DATA:  66 year old female with chest pain EXAM: CT ANGIOGRAPHY CHEST WITH CONTRAST TECHNIQUE: Multidetector CT imaging of the chest was performed using the standard protocol during bolus administration of intravenous contrast. Multiplanar CT image reconstructions and MIPs were obtained to evaluate the vascular anatomy. CONTRAST:  100 mL Isovue 370 COMPARISON:  Prior CT scan of the chest 06/24/2015 FINDINGS: Cardiovascular: The initial non contrasted images demonstrate no evidence of high attenuation intramural hematoma. Following administration of intravenous contrast, the thoracic aorta is well opacified. Conventional 3 vessel arch anatomy. No evidence of dissection. The ascending thoracic aorta is ectatic bordering on mildly aneurysmal at 4.3 cm. Similarly, the main pulmonary artery is enlarged at 3.6 cm. The heart is grossly normal in size. No pericardial effusion. Mediastinum/Nodes: Unremarkable CT appearance of the thyroid gland. No suspicious mediastinal or hilar adenopathy. No soft tissue mediastinal mass. The thoracic esophagus is unremarkable. Lungs/Pleura: The lungs are clear. Upper Abdomen: No acute abnormality. Musculoskeletal: No acute fracture or aggressive appearing lytic or blastic osseous lesion. Review of the MIP images confirms the above findings. IMPRESSION: 1. No evidence of acute aortic dissection, or other acute cardiopulmonary process. 2. Mildly aneurysmal ascending thoracic aorta with a maximal diameter of 4.3 cm which is unchanged compared to 06/24/2015. Recommend annual imaging followup by CTA or MRA. This recommendation follows 2010 ACCF/AHA/AATS/ACR/ASA/SCA/SCAI/SIR/STS/SVM Guidelines for the Diagnosis and  Management of Patients with Thoracic Aortic Disease. Circulation. 2010; 121: P710-G269 3. Enlarged main pulmonary artery at 3.5 cm. Similar findings can be seen in the setting of pulmonary arterial hypertension. 4. The lungs are clear. Electronically Signed   By: Jacqulynn Cadet M.D.   On: 11/20/2016 14:30    My personal review of EKG: Rhythm NSR   Assessment & Plan:    Active Problems:  Chest pain   1. Chest pain- place under observation to rule out ACS, will obtain serial cardiac enzymes. EKG shows no significant abnormality. First troponin is negative in the ED.Continue aspirin 2. Right arm numbness- patient complains of right arm numbness since yesterday, no other neurologic symptoms, will check CT to head rule out underlying CVA. If negative consider MRI brain in a.m. continue aspirin 3. History of ascending aortic aneurysm- stable as per CT chest. Recommended annual imaging follow-up with CT A or MRA. 4. Hypertension- blood pressure stable, continue lisinopril, amlodipine, Catapres, metoprolol, Lasix.   DVT Prophylaxis-   Lovenox   AM Labs Ordered, also please review Full Orders  Family Communication: Admission, patients condition and plan of care including tests being ordered have been discussed with the patient who indicate understanding and agree with the plan and Code Status.  Code Status:  DO NOT RESUSCITATE  Admission status: *Observation  Time spent in minutes : 60 minutes   LAMA,GAGAN S M.D on 11/20/2016 at 5:25 PM  Between 7am to 7pm - Pager - [phone]. After 7pm go to [email or web address] - password [facility]  [facility] - Office  [phone]

## 2016-11-20 NOTE — ED Provider Notes (Signed)
[facility] DEPT Provider Note   CSN: [id number] Arrival date & time: 11/20/16  1217  By signing my name below, I, Sabrina Curry, attest that this documentation has been prepared under the direction and in the presence of Sabrina Belling, MD. Electronically Signed: Sonum Curry, Education administrator. 11/20/16. 12:42 PM.  History   Chief Complaint Chief Complaint  Patient presents with  . Chest Pain    The history is provided by the patient. No language interpreter was used.     HPI Comments: Sabrina Curry is a 66 y.o. female who presents to the Emergency Department complaining of intermittent, substernal CP that began this morning. She states the episodes last 5-10 minutes at a time and are not precipitated by anything. She denies similar symptoms in the past. She also complains of altered sensation and decreased strength in the RUE that also started today. She denies cough, fever, chills, SOB, leg swelling. She denies recent changes in medications. She has a history of a thoracic aortic aneurysm but has not had any recent CT of this.   Past Medical History:  Diagnosis Date  . Anxiety   . Arthritis   . Asthma   . Chronic back pain   . COPD (chronic obstructive pulmonary disease) ([place])   . GERD (gastroesophageal reflux disease)   . H/O dizziness   . H/O shortness of breath   . Headache(784.0)   . Hx of migraines   . Hypertension   . Knee pain, bilateral   . Obstructive sleep apnea    On CPAP by nasal prong  . Palpitations 12/2012   ? SVT  . Seasonal allergies   . Stroke [facility])    last stoke Oct 2016  . Thoracic aortic aneurysm [facility]) 12/2012    12/2012: 4.3 cm fusiform aneurysm-ascending thoracic aorta  . Varicose veins of both legs with edema     Patient Active Problem List   Diagnosis Date Noted  . Varicose veins of bilateral lower extremities with other complications 74/16/3845  . Swelling of limb 10/10/2016  . Spider veins of both lower extremities 10/10/2016  . COPD exacerbation  ([place]) 02/06/2016  . Acute respiratory failure with hypoxia ([place]) 02/06/2016  . Hypoxia 02/06/2016  . COPD (chronic obstructive pulmonary disease) ([place])   . Chronic back pain   . Chronic obstructive pulmonary disease ([place])   . Chest pain 06/24/2015  . TIA (transient ischemic attack) 06/24/2015  . UTI (lower urinary tract infection) 06/24/2015  . Difficulty speaking   . Slurred speech   . Skin eruption 01/30/2013  . Obstructive sleep apnea   . Thoracic aortic aneurysm ([place]) 12/17/2012  . Paroxysmal supraventricular tachycardia-nonsustained 12/08/2012  . Morbid obesity ([place]) 12/08/2012  . Asthma, chronic 12/08/2012  . Hypertension 12/08/2012    Past Surgical History:  Procedure Laterality Date  . ABDOMINAL HYSTERECTOMY    . APPENDECTOMY     with gallbladder removal  . BACK SURGERY     had staff infection of back  . CATARACT EXTRACTION W/PHACO  01/16/2012   Procedure: CATARACT EXTRACTION PHACO AND INTRAOCULAR LENS PLACEMENT (IOC);  Surgeon: Elta Guadeloupe T. Gershon Crane, MD;  Location: [facility];  Service: Ophthalmology;  Laterality: Right;  CDE 6.12  . CHOLECYSTECTOMY     [facility]  . HERNIA REPAIR  2009   inscional hernia repair x2-3  . UMBILICAL HERNIA REPAIR      OB History    No data available       Home Medications    Prior to Admission medications  Medication Sig Start Date End Date Taking? Authorizing Provider  albuterol (PROAIR HFA) 108 (90 Base) MCG/ACT inhaler Inhale 1 puff into the lungs every 6 (six) hours as needed for wheezing or shortness of breath.   Yes Historical Provider, MD  amLODipine (NORVASC) 10 MG tablet TAKE ONE TABLET BY MOUTH DAILY. 06/21/15  Yes Herminio Commons, MD  aspirin 81 MG tablet Take 2 tablets (162 mg total) by mouth daily. 06/25/15  Yes Kathie Dike, MD  budesonide-formoterol (SYMBICORT) 160-4.5 MCG/ACT inhaler Inhale 2 puffs into the lungs daily.   Yes Historical Provider, MD  cloNIDine (CATAPRES) 0.1 MG tablet Take 0.1 mg by mouth 2 (two)  times daily.   Yes Historical Provider, MD  furosemide (LASIX) 40 MG tablet Take 40 mg by mouth daily.   Yes Historical Provider, MD  hydrALAZINE (APRESOLINE) 50 MG tablet Take 1 tablet (50 mg total) by mouth 3 (three) times daily. 09/01/16 11/30/16 Yes Herminio Commons, MD  isosorbide mononitrate (IMDUR) 30 MG 24 hr tablet TAKE ONE TABLET BY MOUTH ONCE DAILY. 08/15/16  Yes Herminio Commons, MD  lisinopril (PRINIVIL,ZESTRIL) 40 MG tablet Take 1 tablet (40 mg total) by mouth daily. 11/16/14  Yes Herminio Commons, MD  metoprolol (LOPRESSOR) 100 MG tablet TAKE ONE TABLET BY MOUTH TWICE DAILY. 04/19/15  Yes Herminio Commons, MD  oxyCODONE-acetaminophen (PERCOCET/ROXICET) 5-325 MG tablet Take 1 tablet by mouth daily as needed for moderate pain or severe pain.   Yes Historical Provider, MD  OXYGEN Inhale into the lungs.   Yes Historical Provider, MD  pantoprazole (PROTONIX) 40 MG tablet Take 1 tablet (40 mg total) by mouth daily. 06/25/15  Yes Kathie Dike, MD  potassium chloride (K-DUR) 10 MEQ tablet TAKE ONE TABLET BY MOUTH TWICE DAILY. 08/15/16  Yes Herminio Commons, MD  promethazine (PHENERGAN) 25 MG tablet Take 25 mg by mouth every 6 (six) hours as needed for nausea or vomiting.   Yes Historical Provider, MD  vitamin B-12 (CYANOCOBALAMIN) 1000 MCG tablet Take 1 tablet (1,000 mcg total) by mouth daily. 06/25/15  Yes Kathie Dike, MD    Family History Family History  Problem Relation Age of Onset  . Hypertension Mother   . Diabetes Mother   . Hyperlipidemia Father     Also hypertension and an aneurysm  . Anesthesia problems Neg Hx   . Hypotension Neg Hx   . Pseudochol deficiency Neg Hx   . Malignant hyperthermia Neg Hx     Social History Social History  Substance Use Topics  . Smoking status: Never Smoker  . Smokeless tobacco: Never Used  . Alcohol use No     Allergies   Vancomycin cross reactors   Review of Systems Review of Systems  Constitutional: Negative for  chills and fever.  Respiratory: Negative for cough and shortness of breath.   Cardiovascular: Positive for chest pain. Negative for leg swelling.  Neurological: Positive for weakness.  All other systems reviewed and are negative.    Physical Exam Updated Vital Signs BP 155/88 (BP Location: Left Arm)   Pulse 76   Temp 99 F (37.2 C) (Oral)   Resp 21   Ht 4' 11"  (1.499 m)   Wt 250 lb (113.4 kg)   SpO2 93%   BMI 50.49 kg/m   Physical Exam  Constitutional: She is oriented to person, place, and time. She appears well-developed and well-nourished.  Obese   HENT:  Head: Normocephalic and atraumatic.  Neck: Normal range of motion. No tracheal deviation  present.  Cardiovascular: Normal rate, regular rhythm and normal heart sounds.   Pulmonary/Chest: Effort normal and breath sounds normal. No respiratory distress. She has no wheezes. She has no rales.  Currently on oxygen   Musculoskeletal: She exhibits edema (mild lower extremity pitting edema). She exhibits no deformity.  Neurological: She is alert and oriented to person, place, and time. No sensory deficit.  Good strength in RUE with no sensory deficits. No pronator drift noted.   Skin: Skin is warm and dry.  Psychiatric: She has a normal mood and affect. Her behavior is normal.  Nursing note and vitals reviewed.   ED Treatments / Results   COORDINATION OF CARE: 12:39 PM Discussed treatment plan with pt at bedside and pt agreed to plan.    Labs (all labs ordered are listed, but only abnormal results are displayed) Labs Reviewed  BASIC METABOLIC PANEL - Abnormal; Notable for the following:       Result Value   Potassium 3.4 (*)    Glucose, Bld 107 (*)    Creatinine, Ser 1.04 (*)    GFR calc non Af Amer 56 (*)    All other components within normal limits  CBC  TROPONIN I    EKG  EKG Interpretation  Date/Time:  Monday November 20 2016 12:27:59 EST Ventricular Rate:  87 PR Interval:    QRS Duration: 93 QT  Interval:  375 QTC Calculation: 452 R Axis:   47 Text Interpretation:  Sinus rhythm Confirmed by Alvino Chapel  MD, Ovid Curd [id number]) on 11/20/2016 12:39:39 PM       Radiology Dg Chest 2 View  Result Date: 11/20/2016 CLINICAL DATA:  Chest pain since yesterday. History of thoracic aortic aneurysm, morbid obesity, asthma, tachyarrhythmia is, EXAM: CHEST  2 VIEW COMPARISON:  Portable chest x-ray of Feb 06, 2016 FINDINGS: The mediastinum remains widened but has not significantly changed since the previous study. There is no pleural effusion or pneumothorax. The right lung is mildly hypoinflated. The left lung is better inflated. The heart is mildly enlarged but stable. The pulmonary vascularity is not engorged. There is multilevel degenerative disc disease of the thoracic spine. IMPRESSION: Subsegmental atelectasis or early infiltrate at the right lung base. Mild right-sided hypoinflation. Widened mediastinum which is stable. Further evaluation of the thorax with CT scanning is recommended if there are clinical concerns of possible aortic dissection or leaking thoracic aneurysm. Electronically Signed   By: David  Martinique M.D.   On: 11/20/2016 13:04   Ct Angio Chest Aorta W And/or Wo Contrast  Result Date: 11/20/2016 CLINICAL DATA:  66 year old female with chest pain EXAM: CT ANGIOGRAPHY CHEST WITH CONTRAST TECHNIQUE: Multidetector CT imaging of the chest was performed using the standard protocol during bolus administration of intravenous contrast. Multiplanar CT image reconstructions and MIPs were obtained to evaluate the vascular anatomy. CONTRAST:  100 mL Isovue 370 COMPARISON:  Prior CT scan of the chest 06/24/2015 FINDINGS: Cardiovascular: The initial non contrasted images demonstrate no evidence of high attenuation intramural hematoma. Following administration of intravenous contrast, the thoracic aorta is well opacified. Conventional 3 vessel arch anatomy. No evidence of dissection. The ascending thoracic aorta  is ectatic bordering on mildly aneurysmal at 4.3 cm. Similarly, the main pulmonary artery is enlarged at 3.6 cm. The heart is grossly normal in size. No pericardial effusion. Mediastinum/Nodes: Unremarkable CT appearance of the thyroid gland. No suspicious mediastinal or hilar adenopathy. No soft tissue mediastinal mass. The thoracic esophagus is unremarkable. Lungs/Pleura: The lungs are clear. Upper Abdomen: No acute  abnormality. Musculoskeletal: No acute fracture or aggressive appearing lytic or blastic osseous lesion. Review of the MIP images confirms the above findings. IMPRESSION: 1. No evidence of acute aortic dissection, or other acute cardiopulmonary process. 2. Mildly aneurysmal ascending thoracic aorta with a maximal diameter of 4.3 cm which is unchanged compared to 06/24/2015. Recommend annual imaging followup by CTA or MRA. This recommendation follows 2010 ACCF/AHA/AATS/ACR/ASA/SCA/SCAI/SIR/STS/SVM Guidelines for the Diagnosis and Management of Patients with Thoracic Aortic Disease. Circulation. 2010; 121: P794-I016 3. Enlarged main pulmonary artery at 3.5 cm. Similar findings can be seen in the setting of pulmonary arterial hypertension. 4. The lungs are clear. Electronically Signed   By: Jacqulynn Cadet M.D.   On: 11/20/2016 14:30    Procedures Procedures (including critical care time)  Medications Ordered in ED Medications  nitroGLYCERIN (NITROSTAT) SL tablet 0.4 mg (0.4 mg Sublingual Given 11/20/16 1428)  iopamidol (ISOVUE-370) 76 % injection 100 mL (100 mLs Intravenous Contrast Given 11/20/16 1403)  labetalol (NORMODYNE,TRANDATE) injection 10 mg (10 mg Intravenous Given 11/20/16 1535)     Initial Impression / Assessment and Plan / ED Course  I have reviewed the triage vital signs and the nursing notes.  Pertinent labs & imaging results that were available during my care of the patient were reviewed by me and considered in my medical decision making (see chart for details).      Patient with chest pain. Began this morning. States that both of her arms are tingly with it. History of ascending aortic aneurysm. Stable on CT scan. Pain improved with nitroglycerin and return with symmetrical strength. Blood pressure however was not documented during the time that she was on the nitroglycerin. EKG reassuring. Enzymes negative. Will admit to internal medicine for chest pain and hypertension.    Final Clinical Impressions(s) / ED Diagnoses   Final diagnoses:  Hypertension, unspecified type  Chest pain, unspecified type  Thoracic aortic aneurysm without rupture [facility])    New Prescriptions New Prescriptions   No medications on file   I personally performed the services described in this documentation, which was scribed in my presence. The recorded information has been reviewed and is accurate.      Sabrina Belling, MD 11/20/16 [phone]

## 2016-11-20 NOTE — ED Triage Notes (Signed)
Pt reports chest pain starting at 1100 this am accompanied with dizziness.

## 2016-11-20 NOTE — ED Notes (Signed)
Multiple missed IV attempts; right forearm, Left forearm, left antecubital.

## 2016-11-20 NOTE — ED Notes (Signed)
Pt stable and ready for transport to [facility]. Report given to [place], RN.

## 2016-11-21 ENCOUNTER — Observation Stay (HOSPITAL_COMMUNITY): Payer: Medicaid Other

## 2016-11-21 LAB — TROPONIN I
Troponin I: <0.03 ng/mL (ref <0.03)
Troponin I: <0.03 ng/mL (ref <0.03)

## 2016-11-21 NOTE — Progress Notes (Signed)
Subjective: Patient was admitted due to right chest pain. EKG and troponin was negative. CT of the chest showed no change in the size of her aneurysm. Patient also complaint of left side numbness but her CT of the head and MRI was negative. Patient feels better today.  Objective: Vital signs in last 24 hours: Temp:  [97.1 F (36.2 C)-99.2 F (37.3 C)] 98.7 F (37.1 C) (03/06 1446) Pulse Rate:  [57-80] 57 (03/06 1446) Resp:  [18-20] 18 (03/06 1446) BP: (105-164)/(56-86) 118/80 (03/06 1446) SpO2:  [94 %-98 %] 96 % (03/06 1446) Weight:  [118.1 kg (260 lb 4.8 oz)-118.9 kg (262 lb 3.2 oz)] 118.1 kg (260 lb 4.8 oz) (03/06 0400) Weight change:  Last BM Date: 11/20/16  Intake/Output from previous day: 03/05 0701 - 03/06 0700 In: -  Out: 200 [Urine:200]  PHYSICAL EXAM General appearance: alert and no distress Resp: diminished breath sounds bilaterally and rhonchi bilaterally Cardio: S1, S2 normal GI: soft, non-tender; bowel sounds normal; no masses,  no organomegaly Extremities: extremities normal, atraumatic, no cyanosis or edema  Lab Results:  Results for orders placed or performed during the hospital encounter of 11/20/16 (from the past 48 hour(s))  Basic metabolic panel     Status: Abnormal   Collection Time: 11/20/16  1:11 PM  Result Value Ref Range   Sodium 141 135 - 145 mmol/L   Potassium 3.4 (L) 3.5 - 5.1 mmol/L   Chloride 107 101 - 111 mmol/L   CO2 28 22 - 32 mmol/L   Glucose, Bld 107 (H) 65 - 99 mg/dL   BUN 15 6 - 20 mg/dL   Creatinine, Ser 1.04 (H) 0.44 - 1.00 mg/dL   Calcium 9.5 8.9 - 10.3 mg/dL   GFR calc non Af Amer 56 (L) >60 mL/min   GFR calc Af Amer >60 >60 mL/min    Comment: (NOTE) The eGFR has been calculated using the CKD EPI equation. This calculation has not been validated in all clinical situations. eGFR's persistently <60 mL/min signify possible Chronic Kidney Disease.    Anion gap 6 5 - 15  CBC     Status: None   Collection Time: 11/20/16  1:11 PM   Result Value Ref Range   WBC 7.5 4.0 - 10.5 K/uL   RBC 4.35 3.87 - 5.11 MIL/uL   Hemoglobin 13.3 12.0 - 15.0 g/dL   HCT 41.1 36.0 - 46.0 %   MCV 94.5 78.0 - 100.0 fL   MCH 30.6 26.0 - 34.0 pg   MCHC 32.4 30.0 - 36.0 g/dL   RDW 13.1 11.5 - 15.5 %   Platelets 165 150 - 400 K/uL  Troponin I     Status: None   Collection Time: 11/20/16  1:11 PM  Result Value Ref Range   Troponin I <0.03 <0.03 ng/mL  CBC     Status: None   Collection Time: 11/20/16  7:57 PM  Result Value Ref Range   WBC 7.5 4.0 - 10.5 K/uL   RBC 4.13 3.87 - 5.11 MIL/uL   Hemoglobin 12.7 12.0 - 15.0 g/dL   HCT 39.3 36.0 - 46.0 %   MCV 95.2 78.0 - 100.0 fL   MCH 30.8 26.0 - 34.0 pg   MCHC 32.3 30.0 - 36.0 g/dL   RDW 13.1 11.5 - 15.5 %   Platelets 172 150 - 400 K/uL  Creatinine, serum     Status: Abnormal   Collection Time: 11/20/16  7:57 PM  Result Value Ref Range   Creatinine, Ser  1.11 (H) 0.44 - 1.00 mg/dL   GFR calc non Af Amer 52 (L) >60 mL/min   GFR calc Af Amer 60 (L) >60 mL/min    Comment: (NOTE) The eGFR has been calculated using the CKD EPI equation. This calculation has not been validated in all clinical situations. eGFR's persistently <60 mL/min signify possible Chronic Kidney Disease.   Troponin I (q 6hr x 3)     Status: None   Collection Time: 11/20/16  7:57 PM  Result Value Ref Range   Troponin I <0.03 <0.03 ng/mL  Troponin I (q 6hr x 3)     Status: None   Collection Time: 11/21/16 12:39 AM  Result Value Ref Range   Troponin I <0.03 <0.03 ng/mL  Troponin I (q 6hr x 3)     Status: None   Collection Time: 11/21/16  6:09 AM  Result Value Ref Range   Troponin I <0.03 <0.03 ng/mL    ABGS No results for input(s): PHART, PO2ART, TCO2, HCO3 in the last 72 hours.  Invalid input(s): PCO2 CULTURES No results found for this or any previous visit (from the past 240 hour(s)). Studies/Results: Dg Chest 2 View  Result Date: 11/20/2016 CLINICAL DATA:  Chest pain since yesterday. History of thoracic  aortic aneurysm, morbid obesity, asthma, tachyarrhythmia is, EXAM: CHEST  2 VIEW COMPARISON:  Portable chest x-ray of Feb 06, 2016 FINDINGS: The mediastinum remains widened but has not significantly changed since the previous study. There is no pleural effusion or pneumothorax. The right lung is mildly hypoinflated. The left lung is better inflated. The heart is mildly enlarged but stable. The pulmonary vascularity is not engorged. There is multilevel degenerative disc disease of the thoracic spine. IMPRESSION: Subsegmental atelectasis or early infiltrate at the right lung base. Mild right-sided hypoinflation. Widened mediastinum which is stable. Further evaluation of the thorax with CT scanning is recommended if there are clinical concerns of possible aortic dissection or leaking thoracic aneurysm. Electronically Signed   By: David  Martinique M.D.   On: 11/20/2016 13:04   Ct Head Wo Contrast  Result Date: 11/20/2016 CLINICAL DATA:  Chest pain accompanied by dizziness EXAM: CT HEAD WITHOUT CONTRAST TECHNIQUE: Contiguous axial images were obtained from the base of the skull through the vertex without intravenous contrast. COMPARISON:  06/24/2015 FINDINGS: Brain: No gross territorial infarction is visualized. There is no focal mass, mass effect or midline shift. 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 Scattered periventricular and subcortical white matter hypodensity presumably due to small vessel changes. The ventricles are nonenlarged. Vascular: There is contrast media within the intracranial vessels from recent CT of the chest. No unexpected calcifications. Skull: No fracture.  No suspicious bone lesions. Sinuses/Orbits: Small osteoma in the left ethmoid sinus. No acute orbital abnormality. Other: None IMPRESSION: 1. Examination is slightly limited by residual contrast within the vascular system from recently performed chest CT 2. No gross acute intracranial abnormality 3. Scattered periventricular and subcortical white  matter hypodensity, presumed small vessel changes. Electronically Signed   By: Donavan Foil M.D.   On: 11/20/2016 18:25   Mr Brain Wo Contrast  Result Date: 11/21/2016 CLINICAL DATA:  66 year old female with slurred speech, altered mental status, right arm numbness. Initial encounter. EXAM: MRI HEAD WITHOUT CONTRAST TECHNIQUE: Multiplanar, multiecho pulse sequences of the brain and surrounding structures were obtained without intravenous contrast. COMPARISON:  Head CT without contrast 11/20/2016. Brain MRI 06/24/2015. FINDINGS: The examination had to be discontinued prior to completion. Axial and coronal diffusion weighted imaging, sagittal T1 weighted  imaging and axial T2 weighted imaging only is obtained. Brain: No restricted diffusion to suggest acute infarction. No midline shift, mass effect, evidence of mass lesion, ventriculomegaly, or extra-axial collection. No acute intracranial hemorrhage identified. Cervicomedullary junction and pituitary appear within normal limits. Overall cerebral volume remains normal. Patchy and sometimes confluent bilateral cerebral white matter T2 hyperintensity re- demonstrated. No definite cortical encephalomalacia. Deep gray matter nuclei appear spared. Patchy T2 hyperintensity in the pons, especially the right paracentral pons, appears chronic. There may be a small chronic lacunar infarct in the right cerebellum on series 8, image 5. Vascular: Major intracranial vascular flow voids are stable since 2016. Vertebrobasilar dolichoectasia again noted. Skull and upper cervical spine: Stable, negative. Sinuses/Orbits: Stable , negative. Other: Negative scalp soft tissues. IMPRESSION: 1. The examination had to be discontinued prior to completion. No acute intracranial abnormality identified. 2. Chronic T2 signal abnormality in the brain appears stable since 2016, favor chronic small vessel disease. Electronically Signed   By: Genevie Ann M.D.   On: 11/21/2016 11:14   Ct Angio Chest  Aorta W And/or Wo Contrast  Result Date: 11/20/2016 CLINICAL DATA:  66 year old female with chest pain EXAM: CT ANGIOGRAPHY CHEST WITH CONTRAST TECHNIQUE: Multidetector CT imaging of the chest was performed using the standard protocol during bolus administration of intravenous contrast. Multiplanar CT image reconstructions and MIPs were obtained to evaluate the vascular anatomy. CONTRAST:  100 mL Isovue 370 COMPARISON:  Prior CT scan of the chest 06/24/2015 FINDINGS: Cardiovascular: The initial non contrasted images demonstrate no evidence of high attenuation intramural hematoma. Following administration of intravenous contrast, the thoracic aorta is well opacified. Conventional 3 vessel arch anatomy. No evidence of dissection. The ascending thoracic aorta is ectatic bordering on mildly aneurysmal at 4.3 cm. Similarly, the main pulmonary artery is enlarged at 3.6 cm. The heart is grossly normal in size. No pericardial effusion. Mediastinum/Nodes: Unremarkable CT appearance of the thyroid gland. No suspicious mediastinal or hilar adenopathy. No soft tissue mediastinal mass. The thoracic esophagus is unremarkable. Lungs/Pleura: The lungs are clear. Upper Abdomen: No acute abnormality. Musculoskeletal: No acute fracture or aggressive appearing lytic or blastic osseous lesion. Review of the MIP images confirms the above findings. IMPRESSION: 1. No evidence of acute aortic dissection, or other acute cardiopulmonary process. 2. Mildly aneurysmal ascending thoracic aorta with a maximal diameter of 4.3 cm which is unchanged compared to 06/24/2015. Recommend annual imaging followup by CTA or MRA. This recommendation follows 2010 ACCF/AHA/AATS/ACR/ASA/SCA/SCAI/SIR/STS/SVM Guidelines for the Diagnosis and Management of Patients with Thoracic Aortic Disease. Circulation. 2010; 121: E497-N300 3. Enlarged main pulmonary artery at 3.5 cm. Similar findings can be seen in the setting of pulmonary arterial hypertension. 4. The lungs  are clear. Electronically Signed   By: Jacqulynn Cadet M.D.   On: 11/20/2016 14:30    Medications: I have reviewed the patient's current medications.  Assesment:   Active Problems:   Chest pain aortic aneurysm Bronchial asthma   Numbness   Plan:  Medications reviewed Continue telemetry Continue current treatment    LOS: 0 days   Kayliah Tindol 11/21/2016, 4:47 PM

## 2016-11-21 NOTE — Progress Notes (Signed)
Pt decided she would go through with the MRI. Transported down via staff.

## 2016-11-21 NOTE — Progress Notes (Signed)
Pt refused MRI, as she states she is very claustrophobic and will never have one again. MD will be notified. Will continue to monitor.

## 2016-11-22 LAB — HIV ANTIBODY (ROUTINE TESTING W REFLEX): HIV Screen 4th Generation wRfx: NONREACTIVE

## 2016-11-22 NOTE — Care Management Note (Signed)
Case Management Note  Patient Details  Name: Sabrina Curry MRN: [id number] Date of Birth: February 24, 1951  Subjective/Objective:                  Pt admitted for r/o CP. Chart reviewed for CM needs. Pt from home, lives [place]. Has pcp, transportation and insurance with drug coverage. No HH pta. She plans to return home with self care at DC. Pt discharging home today.   Action/Plan: No CM needs.   Expected Discharge Date:  11/22/16               Expected Discharge Plan:  Home/Self Care  In-House Referral:  NA  Discharge planning Services  CM Consult  Post Acute Care Choice:  NA Choice offered to:  NA  Status of Service:  Completed, signed off  Sherald Barge, RN 11/22/2016, 8:29 AM

## 2016-11-22 NOTE — Discharge Summary (Signed)
Physician Discharge Summary  Patient ID: Sabrina Curry MRN: [id number] DOB/AGE: December 03, 1950 66 y.o. 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   aspirin 81 MG tablet Take 2 tablets (162 mg total) by mouth daily.   cloNIDine 0.1 MG tablet Commonly known as:  CATAPRES Take 0.1 mg by mouth 2 (two) times daily.   furosemide 40 MG tablet Commonly known as:  LASIX Take 40 mg by mouth daily.   hydrALAZINE 50 MG tablet Commonly known as:  APRESOLINE Take 1 tablet (50 mg total) by mouth 3 (three) times daily.   isosorbide mononitrate 30 MG 24 hr tablet Commonly known as:  IMDUR TAKE ONE TABLET BY MOUTH ONCE DAILY.   lisinopril 40 MG tablet Commonly known as:  PRINIVIL,ZESTRIL Take 1 tablet (40 mg total) by mouth daily.   metoprolol 100 MG tablet Commonly known as:  LOPRESSOR TAKE ONE TABLET BY MOUTH TWICE DAILY.   oxyCODONE-acetaminophen 5-325 MG tablet Commonly known as:  PERCOCET/ROXICET Take 1 tablet by mouth daily as needed for moderate pain or severe pain.   OXYGEN Inhale into the lungs.   pantoprazole 40 MG tablet Commonly known as:  PROTONIX Take 1 tablet (40 mg total) by mouth daily.   potassium chloride 10 MEQ tablet Commonly known as:  K-DUR TAKE ONE TABLET BY MOUTH TWICE DAILY.   PROAIR HFA 108 (90 Base) MCG/ACT inhaler Generic drug:  albuterol Inhale 1 puff into the lungs every 6 (six) hours as needed for wheezing or shortness of breath.   promethazine 25 MG tablet Commonly known as:  PHENERGAN Take 25 mg by mouth every 6 (six) hours as needed for  nausea or vomiting.   SYMBICORT 160-4.5 MCG/ACT inhaler Generic drug:  budesonide-formoterol Inhale 2 puffs into the lungs daily.   vitamin B-12 1000 MCG tablet Commonly known as:  CYANOCOBALAMIN Take 1 tablet (1,000 mcg total) by mouth daily.       Discharged Condition: improved    Consults: None  Significant Diagnostic Studies: Dg Chest 2 View  Result Date: 11/20/2016 CLINICAL DATA:  Chest pain since yesterday. History of thoracic aortic aneurysm, morbid obesity, asthma, tachyarrhythmia is, EXAM: CHEST  2 VIEW COMPARISON:  Portable chest x-ray of Feb 06, 2016 FINDINGS: The mediastinum remains widened but has not significantly changed since the previous study. There is no pleural effusion or pneumothorax. The right lung is mildly hypoinflated. The left lung is better inflated. The heart is mildly enlarged but stable. The pulmonary vascularity is not engorged. There is multilevel degenerative disc disease of the thoracic spine. IMPRESSION: Subsegmental atelectasis or early infiltrate at the right lung base. Mild right-sided hypoinflation. Widened mediastinum which is stable. Further evaluation of the thorax with CT scanning is recommended if there are clinical concerns of possible aortic dissection or leaking thoracic aneurysm. Electronically Signed   By: David  Martinique M.D.   On: 11/20/2016 13:04   Ct Head Wo Contrast  Result Date: 11/20/2016 CLINICAL DATA:  Chest pain accompanied by dizziness EXAM: CT HEAD WITHOUT CONTRAST TECHNIQUE: Contiguous axial images were obtained  from the base of the skull through the vertex without intravenous contrast. COMPARISON:  06/24/2015 FINDINGS: Brain: No gross territorial infarction is visualized. There is no focal mass, mass effect or midline shift. 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 Scattered periventricular and subcortical white matter hypodensity presumably due to small vessel changes. The ventricles are nonenlarged. Vascular: There is contrast media within  the intracranial vessels from recent CT of the chest. No unexpected calcifications. Skull: No fracture.  No suspicious bone lesions. Sinuses/Orbits: Small osteoma in the left ethmoid sinus. No acute orbital abnormality. Other: None IMPRESSION: 1. Examination is slightly limited by residual contrast within the vascular system from recently performed chest CT 2. No gross acute intracranial abnormality 3. Scattered periventricular and subcortical white matter hypodensity, presumed small vessel changes. Electronically Signed   By: Donavan Foil M.D.   On: 11/20/2016 18:25   Mr Brain Wo Contrast  Result Date: 11/21/2016 CLINICAL DATA:  66 year old female with slurred speech, altered mental status, right arm numbness. Initial encounter. EXAM: MRI HEAD WITHOUT CONTRAST TECHNIQUE: Multiplanar, multiecho pulse sequences of the brain and surrounding structures were obtained without intravenous contrast. COMPARISON:  Head CT without contrast 11/20/2016. Brain MRI 06/24/2015. FINDINGS: The examination had to be discontinued prior to completion. Axial and coronal diffusion weighted imaging, sagittal T1 weighted imaging and axial T2 weighted imaging only is obtained. Brain: No restricted diffusion to suggest acute infarction. No midline shift, mass effect, evidence of mass lesion, ventriculomegaly, or extra-axial collection. No acute intracranial hemorrhage identified. Cervicomedullary junction and pituitary appear within normal limits. Overall cerebral volume remains normal. Patchy and sometimes confluent bilateral cerebral white matter T2 hyperintensity re- demonstrated. No definite cortical encephalomalacia. Deep gray matter nuclei appear spared. Patchy T2 hyperintensity in the pons, especially the right paracentral pons, appears chronic. There may be a small chronic lacunar infarct in the right cerebellum on series 8, image 5. Vascular: Major intracranial vascular flow voids are stable since 2016. Vertebrobasilar  dolichoectasia again noted. Skull and upper cervical spine: Stable, negative. Sinuses/Orbits: Stable , negative. Other: Negative scalp soft tissues. IMPRESSION: 1. The examination had to be discontinued prior to completion. No acute intracranial abnormality identified. 2. Chronic T2 signal abnormality in the brain appears stable since 2016, favor chronic small vessel disease. Electronically Signed   By: Genevie Ann M.D.   On: 11/21/2016 11:14   Ct Angio Chest Aorta W And/or Wo Contrast  Result Date: 11/20/2016 CLINICAL DATA:  66 year old female with chest pain EXAM: CT ANGIOGRAPHY CHEST WITH CONTRAST TECHNIQUE: Multidetector CT imaging of the chest was performed using the standard protocol during bolus administration of intravenous contrast. Multiplanar CT image reconstructions and MIPs were obtained to evaluate the vascular anatomy. CONTRAST:  100 mL Isovue 370 COMPARISON:  Prior CT scan of the chest 06/24/2015 FINDINGS: Cardiovascular: The initial non contrasted images demonstrate no evidence of high attenuation intramural hematoma. Following administration of intravenous contrast, the thoracic aorta is well opacified. Conventional 3 vessel arch anatomy. No evidence of dissection. The ascending thoracic aorta is ectatic bordering on mildly aneurysmal at 4.3 cm. Similarly, the main pulmonary artery is enlarged at 3.6 cm. The heart is grossly normal in size. No pericardial effusion. Mediastinum/Nodes: Unremarkable CT appearance of the thyroid gland. No suspicious mediastinal or hilar adenopathy. No soft tissue mediastinal mass. The thoracic esophagus is unremarkable. Lungs/Pleura: The lungs are clear. Upper Abdomen: No acute abnormality. Musculoskeletal: No acute fracture or aggressive appearing lytic or blastic osseous lesion. Review of the MIP images confirms the above findings. IMPRESSION: 1.  No evidence of acute aortic dissection, or other acute cardiopulmonary process. 2. Mildly aneurysmal ascending thoracic  aorta with a maximal diameter of 4.3 cm which is unchanged compared to 06/24/2015. Recommend annual imaging followup by CTA or MRA. This recommendation follows 2010 ACCF/AHA/AATS/ACR/ASA/SCA/SCAI/SIR/STS/SVM Guidelines for the Diagnosis and Management of Patients with Thoracic Aortic Disease. Circulation. 2010; 121: X914-N829 3. Enlarged main pulmonary artery at 3.5 cm. Similar findings can be seen in the setting of pulmonary arterial hypertension. 4. The lungs are clear. Electronically Signed   By: Jacqulynn Cadet M.D.   On: 11/20/2016 14:30    Lab Results: Basic Metabolic Panel:  Recent Labs  11/20/16 1311 11/20/16 1957  NA 141  --   K 3.4*  --   CL 107  --   CO2 28  --   GLUCOSE 107*  --   BUN 15  --   CREATININE 1.04* 1.11*  CALCIUM 9.5  --    Liver Function Tests: No results for input(s): AST, ALT, ALKPHOS, BILITOT, PROT, ALBUMIN in the last 72 hours.   CBC:  Recent Labs  11/20/16 1311 11/20/16 1957  WBC 7.5 7.5  HGB 13.3 12.7  HCT 41.1 39.3  MCV 94.5 95.2  PLT 165 172    No results found for this or any previous visit (from the past 240 hour(s)).   Hospital Course:   This is a 66 years old female with history of multiple medical illnesses was admitted due to right side chest pain and numbness. Her tropononin and EKG was negative. CT of the chest showed no change in the size of her aortic aneurysm. Her MRI was also negative stroke. Patient was monitored under telemetry and was treated symptomatically. Her symptoms improved. Patient discharged in stable condition to continue her medications and to be followed in out patient.  Discharge Exam: Blood pressure (!) 114/58, pulse 60, temperature 98.1 F (36.7 C), temperature source Oral, resp. rate 16, height 4' 11"  (1.499 m), weight 118.1 kg (260 lb 4.8 oz), SpO2 93 %.    Disposition:  home    Follow-up Information    Thuy Atilano, MD Follow up in 1 week(s).   Specialty:  Internal Medicine Contact  information: [place] [id number] [phone]           Signed: Rosita Fire   11/22/2016, 8:24 AM

## 2017-03-13 ENCOUNTER — Other Ambulatory Visit: Payer: Self-pay | Admitting: Cardiovascular Disease

## 2017-04-02 ENCOUNTER — Other Ambulatory Visit: Payer: Self-pay | Admitting: Cardiovascular Disease

## 2017-05-09 ENCOUNTER — Encounter: Payer: Medicaid Other | Admitting: Cardiothoracic Surgery

## 2017-05-22 ENCOUNTER — Ambulatory Visit (INDEPENDENT_AMBULATORY_CARE_PROVIDER_SITE_OTHER): Payer: Medicaid Other | Admitting: Cardiovascular Disease

## 2017-05-22 ENCOUNTER — Encounter: Payer: Self-pay | Admitting: Cardiovascular Disease

## 2017-05-22 VITALS — BP 142/90 | HR 90 | Ht 60.0 in | Wt 269.0 lb

## 2017-05-22 DIAGNOSIS — I471 Supraventricular tachycardia, unspecified: Secondary | ICD-10-CM

## 2017-05-22 DIAGNOSIS — I8390 Asymptomatic varicose veins of unspecified lower extremity: Secondary | ICD-10-CM | POA: Diagnosis not present

## 2017-05-22 DIAGNOSIS — I712 Thoracic aortic aneurysm, without rupture, unspecified: Secondary | ICD-10-CM

## 2017-05-22 DIAGNOSIS — R600 Localized edema: Secondary | ICD-10-CM | POA: Diagnosis not present

## 2017-05-22 DIAGNOSIS — I10 Essential (primary) hypertension: Secondary | ICD-10-CM

## 2017-05-22 MED ORDER — HYDRALAZINE HCL 50 MG PO TABS: 75.0000 mg | ORAL_TABLET | Freq: Three times a day (TID) | ORAL | 3 refills | Status: DC

## 2017-05-22 MED ORDER — METOLAZONE 5 MG PO TABS: ORAL_TABLET | ORAL | 0 refills | Status: DC

## 2017-05-22 NOTE — Progress Notes (Signed)
SUBJECTIVE: The patient has a history of generalized anxiety disorder, PSVT, malignanthypertension, thoracic aortic aneurysm and chest pain.   Echocardiogram 02/07/16: Normal left ventricular systolic and diastolic function, LVEF 48-88%, mild mitral regurgitation, mildly dilated ascending aorta.  She has chronic lower extremity edema and is morbidly obese.  She was evaluated by vascular surgery and they said nothing could be done. They offered compression stockings.  Lower extremity Dopplers were normal on 09/23/16.  She tells me her PCP switched Lasix to torsemide 30 mg twice daily in June 2018 and she has had minimal relief.  She does not smoke. She uses oxygen.       Review of Systems: As per "subjective", otherwise negative.  Allergies  Allergen Reactions  . Vancomycin Cross Reactors Anaphylaxis    Current Outpatient Prescriptions  Medication Sig Dispense Refill  . albuterol (PROAIR HFA) 108 (90 Base) MCG/ACT inhaler Inhale 1 puff into the lungs every 6 (six) hours as needed for wheezing or shortness of breath.    [name] Kitchen amLODipine (NORVASC) 10 MG tablet TAKE ONE TABLET BY MOUTH DAILY. 30 tablet 11  . aspirin 81 MG tablet Take 2 tablets (162 mg total) by mouth daily. 30 tablet   . budesonide-formoterol (SYMBICORT) 160-4.5 MCG/ACT inhaler Inhale 2 puffs into the lungs daily.    . cloNIDine (CATAPRES) 0.1 MG tablet Take 0.1 mg by mouth 2 (two) times daily.    . hydrALAZINE (APRESOLINE) 50 MG tablet Take 1 tablet (50 mg total) by mouth 3 (three) times daily. 270 tablet 3  . isosorbide mononitrate (IMDUR) 30 MG 24 hr tablet TAKE ONE TABLET BY MOUTH ONCE DAILY. 30 tablet 2  . lisinopril (PRINIVIL,ZESTRIL) 40 MG tablet Take 1 tablet (40 mg total) by mouth daily. 90 tablet 3  . metoprolol (LOPRESSOR) 100 MG tablet TAKE ONE TABLET BY MOUTH TWICE DAILY. 60 tablet 3  . oxyCODONE-acetaminophen (PERCOCET/ROXICET) 5-325 MG tablet Take 1 tablet by mouth daily as needed for moderate  pain or severe pain.    . OXYGEN Inhale into the lungs.    . pantoprazole (PROTONIX) 40 MG tablet Take 1 tablet (40 mg total) by mouth daily. 30 tablet 1  . potassium chloride (K-DUR) 10 MEQ tablet TAKE ONE TABLET BY MOUTH TWICE DAILY. 60 tablet 2  . promethazine (PHENERGAN) 25 MG tablet Take 25 mg by mouth every 6 (six) hours as needed for nausea or vomiting.    . torsemide (DEMADEX) 20 MG tablet Take 20 mg by mouth daily.    . vitamin B-12 (CYANOCOBALAMIN) 1000 MCG tablet Take 1 tablet (1,000 mcg total) by mouth daily. 30 tablet 1   No current facility-administered medications for this visit.     Past Medical History:  Diagnosis Date  . Anxiety   . Arthritis   . Asthma   . Chronic back pain   . COPD (chronic obstructive pulmonary disease) ([place])   . GERD (gastroesophageal reflux disease)   . H/O dizziness   . H/O shortness of breath   . Headache(784.0)   . Hx of migraines   . Hypertension   . Knee pain, bilateral   . Obstructive sleep apnea    On CPAP by nasal prong  . Palpitations 12/2012   ? SVT  . Seasonal allergies   . Stroke [facility])    last stoke Oct 2016  . Thoracic aortic aneurysm [facility]) 12/2012    12/2012: 4.3 cm fusiform aneurysm-ascending thoracic aorta  . Varicose veins of both legs with edema  Past Surgical History:  Procedure Laterality Date  . ABDOMINAL HYSTERECTOMY    . APPENDECTOMY     with gallbladder removal  . BACK SURGERY     had staff infection of back  . CATARACT EXTRACTION W/PHACO  01/16/2012   Procedure: CATARACT EXTRACTION PHACO AND INTRAOCULAR LENS PLACEMENT (IOC);  Surgeon: Elta Guadeloupe T. Gershon Crane, MD;  Location: [facility];  Service: Ophthalmology;  Laterality: Right;  CDE 6.12  . CHOLECYSTECTOMY     [facility]  . HERNIA REPAIR  2009   inscional hernia repair x2-3  . UMBILICAL HERNIA REPAIR      Social History   Social History  . Marital status: Married    Spouse name: N/A  . Number of children: N/A  . Years of education: N/A   Occupational  History  . CNA-retired     [employer]   Social History Main Topics  . Smoking status: Never Smoker  . Smokeless tobacco: Never Used  . Alcohol use No  . Drug use: No  . Sexual activity: Yes    Birth control/ protection: Post-menopausal, Surgical   Other Topics Concern  . Not on file   Social History Narrative  . No narrative on file     Vitals:   05/22/17 0826  BP: (!) 142/90  Pulse: 90  SpO2: 100%  Weight: 269 lb (122 kg)  Height: 5' (1.524 m)    Wt Readings from Last 3 Encounters:  05/22/17 269 lb (122 kg)  11/21/16 260 lb 4.8 oz (118.1 kg)  10/10/16 265 lb (120.2 kg)     PHYSICAL EXAM General: NAD HEENT: Normal. Neck: No JVD, no thyromegaly. Lungs: Faint scattered expiratory wheezes bilaterally. CV: Nondisplaced PMI.  Regular rate and rhythm, normal S1/S2, no S8/N4, 1/6 systolic murmur over right upper sternal border. Bilateral lower extremity pitting edema, 1+.     Abdomen: Soft, protuberant.  Neurologic: Alert and oriented.  Psych: Normal affect. Skin: Normal.    ECG: Most recent ECG reviewed.   Labs: Lab Results  Component Value Date/Time   K 3.4 (L) 11/20/2016 01:11 PM   BUN 15 11/20/2016 01:11 PM   CREATININE 1.11 (H) 11/20/2016 07:57 PM   CREATININE 1.11 (H) 03/12/2015 11:37 AM   ALT 5 (L) 06/23/2015 11:40 PM   TSH 1.551 06/25/2015 05:33 AM   TSH 2.378 12/07/2012 04:47 PM   HGB 12.7 11/20/2016 07:57 PM     Lipids: Lab Results  Component Value Date/Time   LDLCALC 77 06/24/2015 04:04 AM   CHOL 128 06/24/2015 04:04 AM   TRIG 79 06/24/2015 04:04 AM   HDL 35 (L) 06/24/2015 04:04 AM       ASSESSMENT AND PLAN: 1. Chest pain: Symptoms appear to be stable. Aim to control BP. Normal dobutamine stress echocardiogram on 07-10-2012.   2. Malignant hypertension: Elevated. Will increase hydralazine to 75 mg TID.  3. PSVT: Symptoms appear to be reasonably well controlled on metoprolol 100 mg twice daily. No changes.  4. Thoracic  aortic aneurysm: Followed by CT surgery. Remains mild when most recently evaluated at 4.3cm in 11/20/2016.   5. TIA: Continue ASA. Aggressive BP control warranted.  6. Chronic lower extremity edema: This is likely due to morbid obesity and chronic venous insufficiency. Diuretics are unlikely to work. However, given that she has no other reasonable options, I will try metolazone 5 mg daily for 3 days and check a basic metabolic panel within the next several days. I recommended leg elevation while at home.  Disposition: Follow up 1 yr.   Kate Sable, M.D., F.A.C.C.

## 2017-05-22 NOTE — Patient Instructions (Addendum)
Your physician wants you to follow-up in: 1 year with Dr.Koneswaran You will receive a reminder letter in the mail two months in advance. If you don't receive a letter, please call our office to schedule the follow-up appointment.  INCREASE Hydralazine 75 mg three times a day   Take Metolazone 5 mg daily for 3 days and then STOP, get blood work done on Thursday  05/24/17       Thank you for choosing [facility] !

## 2017-05-24 LAB — BASIC METABOLIC PANEL WITH GFR
BUN / CREAT RATIO: 16 (calc) (ref 6–22)
BUN: 20 mg/dL (ref 7–25)
CO2: 31 mmol/L (ref 20–32)
Calcium: 9.4 mg/dL (ref 8.6–10.4)
Chloride: 94 mmol/L — ABNORMAL LOW (ref 98–110)
Creat: 1.27 mg/dL — ABNORMAL HIGH (ref 0.50–0.99)
GFR, EST AFRICAN AMERICAN: 52 mL/min/{1.73_m2} — AB (ref >=60)
GFR, EST NON AFRICAN AMERICAN: 45 mL/min/{1.73_m2} — AB (ref >=60)
Glucose, Bld: 110 mg/dL — ABNORMAL HIGH (ref 65–99)
Potassium: 3.3 mmol/L — ABNORMAL LOW (ref 3.5–5.3)
Sodium: 137 mmol/L (ref 135–146)

## 2017-05-30 ENCOUNTER — Encounter: Payer: Medicaid Other | Admitting: Cardiothoracic Surgery

## 2017-06-11 ENCOUNTER — Ambulatory Visit: Payer: Medicaid Other | Admitting: Cardiovascular Disease

## 2017-07-16 ENCOUNTER — Other Ambulatory Visit: Payer: Self-pay | Admitting: Cardiovascular Disease

## 2017-08-17 ENCOUNTER — Other Ambulatory Visit: Payer: Self-pay | Admitting: Cardiovascular Disease

## 2017-10-24 ENCOUNTER — Encounter: Payer: Self-pay | Admitting: Orthopedic Surgery

## 2017-11-19 ENCOUNTER — Observation Stay (HOSPITAL_COMMUNITY)
Admission: EM | Admit: 2017-11-19 | Discharge: 2017-11-20 | Disposition: A | Discharge status: Home / Self Care | Payer: Medicaid Other | Attending: Internal Medicine | Admitting: Internal Medicine

## 2017-11-19 ENCOUNTER — Other Ambulatory Visit: Payer: Self-pay

## 2017-11-19 ENCOUNTER — Observation Stay (HOSPITAL_BASED_OUTPATIENT_CLINIC_OR_DEPARTMENT_OTHER): Payer: Medicaid Other

## 2017-11-19 ENCOUNTER — Encounter (HOSPITAL_COMMUNITY): Payer: Self-pay | Admitting: Emergency Medicine

## 2017-11-19 ENCOUNTER — Emergency Department (HOSPITAL_COMMUNITY): Payer: Medicaid Other

## 2017-11-19 DIAGNOSIS — R079 Chest pain, unspecified: Secondary | ICD-10-CM

## 2017-11-19 DIAGNOSIS — R0789 Other chest pain: Secondary | ICD-10-CM | POA: Diagnosis present

## 2017-11-19 DIAGNOSIS — J441 Chronic obstructive pulmonary disease with (acute) exacerbation: Secondary | ICD-10-CM | POA: Diagnosis not present

## 2017-11-19 DIAGNOSIS — Z79899 Other long term (current) drug therapy: Secondary | ICD-10-CM | POA: Insufficient documentation

## 2017-11-19 DIAGNOSIS — Z7982 Long term (current) use of aspirin: Secondary | ICD-10-CM | POA: Diagnosis not present

## 2017-11-19 DIAGNOSIS — J42 Unspecified chronic bronchitis: Secondary | ICD-10-CM

## 2017-11-19 DIAGNOSIS — I10 Essential (primary) hypertension: Secondary | ICD-10-CM

## 2017-11-19 DIAGNOSIS — J449 Chronic obstructive pulmonary disease, unspecified: Secondary | ICD-10-CM | POA: Diagnosis present

## 2017-11-19 DIAGNOSIS — I712 Thoracic aortic aneurysm, without rupture, unspecified: Secondary | ICD-10-CM | POA: Diagnosis present

## 2017-11-19 DIAGNOSIS — J45909 Unspecified asthma, uncomplicated: Secondary | ICD-10-CM | POA: Diagnosis not present

## 2017-11-19 DIAGNOSIS — Z8673 Personal history of transient ischemic attack (TIA), and cerebral infarction without residual deficits: Secondary | ICD-10-CM | POA: Insufficient documentation

## 2017-11-19 DIAGNOSIS — Z6841 Body Mass Index (BMI) 40.0 and over, adult: Secondary | ICD-10-CM | POA: Diagnosis not present

## 2017-11-19 HISTORY — DX: Localized edema: R60.0

## 2017-11-19 LAB — ECHOCARDIOGRAM COMPLETE
AVLVOTPG: 4 mmHg
CHL CUP MV DEC (S): 194
EWDT: 194 ms
FS: 38 % (ref 28–44)
Height: 59 in
IVS/LV PW RATIO, ED: 1.09
LA ID, A-P, ES: 37 mm
LA diam end sys: 37 mm
LA vol: 62.8 mL
LADIAMINDEX: 1.58 cm/m2
LAVOLA4C: 56.2 mL
LAVOLIN: 26.8 mL/m2
LDCA: 3.46 cm2
LVOT SV: 74 mL
LVOT VTI: 21.5 cm
LVOT diameter: 21 mm
LVOT peak vel: 99.2 cm/s
MV pk A vel: 78.8 m/s
MV pk E vel: 119 m/s
MVPG: 6 mmHg
PW: 10.2 mm — AB (ref 0.6–1.1)
TAPSE: 29.5 mm
Weight: 4320 oz

## 2017-11-19 LAB — COMPREHENSIVE METABOLIC PANEL
ALBUMIN: 3.8 g/dL (ref 3.5–5.0)
ALT: 5 U/L — ABNORMAL LOW (ref 14–54)
AST: 19 U/L (ref 15–41)
Alkaline Phosphatase: 77 U/L (ref 38–126)
Anion gap: 12 (ref 5–15)
BUN: 14 mg/dL (ref 6–20)
CHLORIDE: 103 mmol/L (ref 101–111)
CO2: 25 mmol/L (ref 22–32)
CREATININE: 1.05 mg/dL — AB (ref 0.44–1.00)
Calcium: 9 mg/dL (ref 8.9–10.3)
GFR, EST NON AFRICAN AMERICAN: 55 mL/min — AB (ref >60)
Glucose, Bld: 121 mg/dL — ABNORMAL HIGH (ref 65–99)
POTASSIUM: 3.5 mmol/L (ref 3.5–5.1)
Sodium: 140 mmol/L (ref 135–145)
Total Bilirubin: 0.6 mg/dL (ref 0.3–1.2)
Total Protein: 7.5 g/dL (ref 6.5–8.1)

## 2017-11-19 LAB — CBC
HEMATOCRIT: 39.3 % (ref 36.0–46.0)
HEMOGLOBIN: 11.9 g/dL — AB (ref 12.0–15.0)
MCH: 29.2 pg (ref 26.0–34.0)
MCHC: 30.3 g/dL (ref 30.0–36.0)
MCV: 96.6 fL (ref 78.0–100.0)
Platelets: 174 10*3/uL (ref 150–400)
RBC: 4.07 MIL/uL (ref 3.87–5.11)
RDW: 13.9 % (ref 11.5–15.5)
WBC: 7.2 10*3/uL (ref 4.0–10.5)

## 2017-11-19 LAB — DIFFERENTIAL
Basophils Absolute: 0.1 10*3/uL (ref 0.0–0.1)
Basophils Relative: 1 %
Eosinophils Absolute: 0.4 10*3/uL (ref 0.0–0.7)
Eosinophils Relative: 6 %
LYMPHS ABS: 3 10*3/uL (ref 0.7–4.0)
Lymphocytes Relative: 41 %
Monocytes Absolute: 0.6 10*3/uL (ref 0.1–1.0)
Monocytes Relative: 8 %
NEUTROS ABS: 3.3 10*3/uL (ref 1.7–7.7)
NEUTROS PCT: 44 %

## 2017-11-19 LAB — TROPONIN I: Troponin I: <0.03 ng/mL (ref <0.03)

## 2017-11-19 MED ORDER — ACETAMINOPHEN 325 MG PO TABS: 650.0000 mg | ORAL_TABLET | ORAL | Status: DC | PRN

## 2017-11-19 MED ORDER — METOPROLOL TARTRATE 50 MG PO TABS
100.0000 mg | ORAL_TABLET | Freq: Two times a day (BID) | ORAL | Status: DC
  Administered 2017-11-19 – 2017-11-20 (×2): 100 mg via ORAL
  Filled 2017-11-19 (×3): qty 2

## 2017-11-19 MED ORDER — VITAMIN B-12 1000 MCG PO TABS
1000.0000 ug | ORAL_TABLET | Freq: Every day | ORAL | Status: DC
  Administered 2017-11-19 – 2017-11-20 (×2): 1000 ug via ORAL
  Filled 2017-11-19 (×2): qty 1

## 2017-11-19 MED ORDER — MORPHINE SULFATE (PF) 4 MG/ML IV SOLN
4.0000 mg | INTRAVENOUS | Status: AC | PRN
  Administered 2017-11-19 (×2): 4 mg via INTRAVENOUS
  Filled 2017-11-19 (×2): qty 1

## 2017-11-19 MED ORDER — PANTOPRAZOLE SODIUM 40 MG PO TBEC
40.0000 mg | DELAYED_RELEASE_TABLET | Freq: Every day | ORAL | Status: DC
  Administered 2017-11-19 – 2017-11-20 (×2): 40 mg via ORAL
  Filled 2017-11-19 (×2): qty 1

## 2017-11-19 MED ORDER — PERFLUTREN LIPID MICROSPHERE
1.0000 mL | INTRAVENOUS | Status: AC | PRN
  Administered 2017-11-19: 2 mL via INTRAVENOUS
  Filled 2017-11-19: qty 10

## 2017-11-19 MED ORDER — MOMETASONE FURO-FORMOTEROL FUM 200-5 MCG/ACT IN AERO
2.0000 | INHALATION_SPRAY | Freq: Two times a day (BID) | RESPIRATORY_TRACT | Status: DC
  Administered 2017-11-19 – 2017-11-20 (×2): 2 via RESPIRATORY_TRACT
  Filled 2017-11-19: qty 8.8

## 2017-11-19 MED ORDER — ALBUTEROL SULFATE HFA 108 (90 BASE) MCG/ACT IN AERS: 1.0000 | INHALATION_SPRAY | Freq: Four times a day (QID) | RESPIRATORY_TRACT | Status: DC | PRN

## 2017-11-19 MED ORDER — ENOXAPARIN SODIUM 60 MG/0.6ML ~~LOC~~ SOLN
60.0000 mg | SUBCUTANEOUS | Status: DC
  Administered 2017-11-19: 21:00:00 60 mg via SUBCUTANEOUS
  Filled 2017-11-19: qty 0.6

## 2017-11-19 MED ORDER — ONDANSETRON HCL 4 MG/2ML IJ SOLN: 4.0000 mg | Freq: Four times a day (QID) | INTRAMUSCULAR | Status: DC | PRN

## 2017-11-19 MED ORDER — OXYCODONE-ACETAMINOPHEN 5-325 MG PO TABS
1.0000 | ORAL_TABLET | Freq: Two times a day (BID) | ORAL | Status: DC | PRN
  Administered 2017-11-19 – 2017-11-20 (×2): 1 via ORAL
  Filled 2017-11-19 (×2): qty 1

## 2017-11-19 MED ORDER — ISOSORBIDE MONONITRATE ER 60 MG PO TB24
30.0000 mg | ORAL_TABLET | Freq: Every day | ORAL | Status: DC
  Administered 2017-11-19 – 2017-11-20 (×2): 30 mg via ORAL
  Filled 2017-11-19 (×2): qty 1

## 2017-11-19 MED ORDER — HYDRALAZINE HCL 25 MG PO TABS
75.0000 mg | ORAL_TABLET | Freq: Three times a day (TID) | ORAL | Status: DC
  Administered 2017-11-19: 75 mg via ORAL
  Filled 2017-11-19 (×3): qty 3

## 2017-11-19 MED ORDER — AMLODIPINE BESYLATE 5 MG PO TABS
10.0000 mg | ORAL_TABLET | Freq: Every day | ORAL | Status: DC
  Filled 2017-11-19: qty 2

## 2017-11-19 MED ORDER — ALBUTEROL SULFATE (2.5 MG/3ML) 0.083% IN NEBU: 2.5000 mg | INHALATION_SOLUTION | Freq: Four times a day (QID) | RESPIRATORY_TRACT | Status: DC | PRN

## 2017-11-19 MED ORDER — METOLAZONE 5 MG PO TABS
5.0000 mg | ORAL_TABLET | Freq: Every day | ORAL | Status: DC
  Administered 2017-11-19 – 2017-11-20 (×2): 5 mg via ORAL
  Filled 2017-11-19 (×2): qty 1

## 2017-11-19 MED ORDER — ALBUTEROL SULFATE (2.5 MG/3ML) 0.083% IN NEBU
2.5000 mg | INHALATION_SOLUTION | Freq: Three times a day (TID) | RESPIRATORY_TRACT | Status: DC
  Administered 2017-11-20: 2.5 mg via RESPIRATORY_TRACT
  Filled 2017-11-19: qty 3

## 2017-11-19 MED ORDER — NITROGLYCERIN 0.4 MG SL SUBL: 0.4000 mg | SUBLINGUAL_TABLET | SUBLINGUAL | Status: DC | PRN

## 2017-11-19 MED ORDER — NITROGLYCERIN 0.4 MG SL SUBL
0.4000 mg | SUBLINGUAL_TABLET | SUBLINGUAL | Status: DC | PRN
  Administered 2017-11-19 (×2): 0.4 mg via SUBLINGUAL
  Filled 2017-11-19: qty 1

## 2017-11-19 MED ORDER — GI COCKTAIL ~~LOC~~
30.0000 mL | Freq: Once | ORAL | Status: AC
  Administered 2017-11-19: 30 mL via ORAL
  Filled 2017-11-19: qty 30

## 2017-11-19 MED ORDER — LISINOPRIL 10 MG PO TABS
40.0000 mg | ORAL_TABLET | Freq: Every day | ORAL | Status: DC
  Administered 2017-11-19: 40 mg via ORAL
  Filled 2017-11-19 (×2): qty 4

## 2017-11-19 MED ORDER — ASPIRIN EC 325 MG PO TBEC
325.0000 mg | DELAYED_RELEASE_TABLET | Freq: Every day | ORAL | Status: DC
  Administered 2017-11-20: 325 mg via ORAL
  Filled 2017-11-19: qty 1

## 2017-11-19 MED ORDER — ALBUTEROL SULFATE (2.5 MG/3ML) 0.083% IN NEBU
2.5000 mg | INHALATION_SOLUTION | RESPIRATORY_TRACT | Status: DC | PRN
  Administered 2017-11-19: 2.5 mg via RESPIRATORY_TRACT
  Filled 2017-11-19: qty 3

## 2017-11-19 MED ORDER — POTASSIUM CHLORIDE CRYS ER 20 MEQ PO TBCR
20.0000 meq | EXTENDED_RELEASE_TABLET | Freq: Two times a day (BID) | ORAL | Status: DC
  Administered 2017-11-19 – 2017-11-20 (×3): 20 meq via ORAL
  Filled 2017-11-19 (×3): qty 1

## 2017-11-19 MED ORDER — PROMETHAZINE HCL 12.5 MG PO TABS: 25.0000 mg | ORAL_TABLET | Freq: Four times a day (QID) | ORAL | Status: DC | PRN

## 2017-11-19 NOTE — ED Triage Notes (Signed)
PT c/o burning sensation to middle of chest that started this morning. PT states she is on O2 at 2L continuously at home and took her HTN medication and (2) 69m aspirin today prior to EMS arrival.

## 2017-11-19 NOTE — ED Provider Notes (Signed)
[facility] EMERGENCY DEPARTMENT Provider Note   CSN: [id number] Arrival date & time: 11/19/17  0734     History   Chief Complaint Chief Complaint  Patient presents with  . Chest Pain    HPI Sabrina Curry is a 67 y.o. female.   Chest Pain      Pt was seen at 0810.  Per pt, c/o gradual onset and persistence of constant mid-sternal chest "pain" that began approximately 0600 PTA. Pt states she was sitting in a chair when her symptoms began. Describes the CP as burning, with radiation into her right arm. Pt took ASA without improvement. Denies palpitations, no change in chronic SOB, no back pain, no abd pain, no N/V/D, no rash, no fevers, no injury.   Past Medical History:  Diagnosis Date  . Anxiety   . Arthritis   . Asthma   . Chronic back pain   . COPD (chronic obstructive pulmonary disease) ([place])   . Edema of both legs    chronic  . GERD (gastroesophageal reflux disease)   . H/O dizziness   . H/O shortness of breath   . Headache(784.0)   . Hx of migraines   . Hypertension   . Knee pain, bilateral   . Obstructive sleep apnea    On CPAP by nasal prong  . Palpitations 12/2012   ? SVT  . Seasonal allergies   . Stroke [facility])    last stoke Oct 2016  . Thoracic aortic aneurysm [facility]) 12/2012    12/2012: 4.3 cm fusiform aneurysm-ascending thoracic aorta  . Varicose veins of both legs with edema     Patient Active Problem List   Diagnosis Date Noted  . Varicose veins of bilateral lower extremities with other complications 96/28/3662  . Swelling of limb 10/10/2016  . Spider veins of both lower extremities 10/10/2016  . COPD exacerbation ([place]) 02/06/2016  . Acute respiratory failure with hypoxia ([place]) 02/06/2016  . Hypoxia 02/06/2016  . COPD (chronic obstructive pulmonary disease) ([place])   . Chronic back pain   . Chronic obstructive pulmonary disease ([place])   . Chest pain 06/24/2015  . TIA (transient ischemic attack) 06/24/2015  . UTI (lower urinary tract infection)  06/24/2015  . Difficulty speaking   . Slurred speech   . Skin eruption 01/30/2013  . Obstructive sleep apnea   . Thoracic aortic aneurysm ([place]) 12/17/2012  . Paroxysmal supraventricular tachycardia-nonsustained 12/08/2012  . Morbid obesity ([place]) 12/08/2012  . Asthma, chronic 12/08/2012  . Hypertension 12/08/2012    Past Surgical History:  Procedure Laterality Date  . ABDOMINAL HYSTERECTOMY    . APPENDECTOMY     with gallbladder removal  . BACK SURGERY     had staff infection of back  . CATARACT EXTRACTION W/PHACO  01/16/2012   Procedure: CATARACT EXTRACTION PHACO AND INTRAOCULAR LENS PLACEMENT (IOC);  Surgeon: Elta Guadeloupe T. Gershon Crane, MD;  Location: [facility];  Service: Ophthalmology;  Laterality: Right;  CDE 6.12  . CHOLECYSTECTOMY     [facility]  . HERNIA REPAIR  2009   inscional hernia repair x2-3  . UMBILICAL HERNIA REPAIR      OB History    Gravida Para Term Preterm AB Living             3   SAB TAB Ectopic Multiple Live Births                   Home Medications    Prior to Admission medications   Medication Sig Start Date End Date  Taking? Authorizing Provider  albuterol (PROAIR HFA) 108 (90 Base) MCG/ACT inhaler Inhale 1 puff into the lungs every 6 (six) hours as needed for wheezing or shortness of breath.    [provider]  amLODipine (NORVASC) 10 MG tablet TAKE ONE TABLET BY MOUTH DAILY. 06/21/15   Herminio Commons, MD  aspirin 81 MG tablet Take 2 tablets (162 mg total) by mouth daily. 06/25/15   Kathie Dike, MD  budesonide-formoterol (SYMBICORT) 160-4.5 MCG/ACT inhaler Inhale 2 puffs into the lungs daily.    [provider]  cloNIDine (CATAPRES) 0.1 MG tablet Take 0.1 mg by mouth 2 (two) times daily.    [provider]  hydrALAZINE (APRESOLINE) 50 MG tablet Take 1.5 tablets (75 mg total) by mouth 3 (three) times daily. 05/22/17 08/20/17  Herminio Commons, MD  isosorbide mononitrate (IMDUR) 30 MG 24 hr tablet TAKE (1) TABLET BY MOUTH  ONCE DAILY. 08/20/17   Herminio Commons, MD  lisinopril (PRINIVIL,ZESTRIL) 40 MG tablet Take 1 tablet (40 mg total) by mouth daily. 11/16/14   Herminio Commons, MD  metolazone (ZAROXOLYN) 5 MG tablet Take 5 mg daily for 3 days and then STOP 05/22/17   Herminio Commons, MD  metoprolol (LOPRESSOR) 100 MG tablet TAKE ONE TABLET BY MOUTH TWICE DAILY. 04/19/15   Herminio Commons, MD  oxyCODONE-acetaminophen (PERCOCET/ROXICET) 5-325 MG tablet Take 1 tablet by mouth daily as needed for moderate pain or severe pain.    [provider]  OXYGEN Inhale into the lungs.    [provider]  pantoprazole (PROTONIX) 40 MG tablet Take 1 tablet (40 mg total) by mouth daily. 06/25/15   Kathie Dike, MD  potassium chloride (K-DUR) 10 MEQ tablet TAKE ONE TABLET BY MOUTH TWICE DAILY. 08/20/17   Herminio Commons, MD  promethazine (PHENERGAN) 25 MG tablet Take 25 mg by mouth every 6 (six) hours as needed for nausea or vomiting.    [provider]  torsemide (DEMADEX) 20 MG tablet Take 30 mg by mouth 2 (two) times daily. 05/22/17 takes 30 mg BID    [provider]  vitamin B-12 (CYANOCOBALAMIN) 1000 MCG tablet Take 1 tablet (1,000 mcg total) by mouth daily. 06/25/15   Kathie Dike, MD    Family History Family History  Problem Relation Age of Onset  . Hypertension Mother   . Diabetes Mother   . Hyperlipidemia Father        Also hypertension and an aneurysm  . Anesthesia problems Neg Hx   . Hypotension Neg Hx   . Pseudochol deficiency Neg Hx   . Malignant hyperthermia Neg Hx     Social History Social History   Tobacco Use  . Smoking status: Never Smoker  . Smokeless tobacco: Never Used  Substance Use Topics  . Alcohol use: No    Alcohol/week: 0.0 oz  . Drug use: No     Allergies   Vancomycin cross reactors   Review of Systems Review of Systems  Cardiovascular: Positive for chest pain.  ROS: Statement: All systems negative except as marked or noted  in the HPI; Constitutional: Negative for fever and chills. ; ; Eyes: Negative for eye pain, redness and discharge. ; ; ENMT: Negative for ear pain, hoarseness, nasal congestion, sinus pressure and sore throat. ; ; Cardiovascular: +CP. Negative for palpitations, diaphoresis, dyspnea and peripheral edema. ; ; Respiratory: Negative for cough, wheezing and stridor. ; ; Gastrointestinal: Negative for nausea, vomiting, diarrhea, abdominal pain, blood in stool, hematemesis, jaundice and  rectal bleeding. . ; ; Genitourinary: Negative for dysuria, flank pain and hematuria. ; ; Musculoskeletal: Negative for back pain and neck pain. Negative for swelling and trauma.; ; Skin: Negative for pruritus, rash, abrasions, blisters, bruising and skin lesion.; ; Neuro: Negative for headache, lightheadedness and neck stiffness. Negative for weakness, altered level of consciousness, altered mental status, extremity weakness, paresthesias, involuntary movement, seizure and syncope.        Physical Exam Updated Vital Signs BP (!) 167/89   Pulse 96   Temp 98 F (36.7 C) (Oral)   Resp 19   Ht 4' 11"  (1.499 m)   Wt 122.5 kg (270 lb)   SpO2 96%   BMI 54.53 kg/m   Physical Exam 0815: Physical examination:  Nursing notes reviewed; Vital signs and O2 SAT reviewed;  Constitutional: Well developed, Well nourished, Well hydrated, In no acute distress; Head:  Normocephalic, atraumatic; Eyes: EOMI, PERRL, No scleral icterus; ENMT: Mouth and pharynx normal, Mucous membranes moist; Neck: Supple, Full range of motion, No lymphadenopathy; Cardiovascular: Regular rate and rhythm, No gallop; Respiratory: Breath sounds clear & equal bilaterally, No wheezes.  Speaking full sentences with ease, Normal respiratory effort/excursion; Chest: Nontender, Movement normal; Abdomen: Soft, Nontender, Nondistended, Normal bowel sounds; Genitourinary: No CVA tenderness; Extremities: Pulses normal, No tenderness, +2 pedal edema bilat. No calf  asymmetry.; Neuro: AA&Ox3, Major CN grossly intact.  Speech clear. No gross focal motor or sensory deficits in extremities.; Skin: Color normal, Warm, Dry.   ED Treatments / Results  Labs (all labs ordered are listed, but only abnormal results are displayed)   EKG  EKG Interpretation  Date/Time:  Monday November 19 2017 07:40:01 EST Ventricular Rate:  97 PR Interval:    QRS Duration: 92 QT Interval:  360 QTC Calculation: 458 R Axis:   47 Text Interpretation:  Normal sinus rhythm Ventricular premature  complex Artifact When compared with ECG of 11/20/2016 PVC is now present Confirmed by Francine Graven [phone]) on 11/19/2017 8:15:36 AM       Radiology   Procedures Procedures (including critical care time)  Medications Ordered in ED Medications  nitroGLYCERIN (NITROSTAT) SL tablet 0.4 mg (not administered)  morphine 4 MG/ML injection 4 mg (not administered)     Initial Impression / Assessment and Plan / ED Course  I have reviewed the triage vital signs and the nursing notes.  Pertinent labs & imaging results that were available during my care of the patient were reviewed by me and considered in my medical decision making (see chart for details).  MDM Reviewed: previous chart, nursing note and vitals Reviewed previous: labs and ECG Interpretation: labs, ECG and x-ray    Results for orders placed or performed during the hospital encounter of 11/19/17  CBC  Result Value Ref Range   WBC 7.2 4.0 - 10.5 K/uL   RBC 4.07 3.87 - 5.11 MIL/uL   Hemoglobin 11.9 (L) 12.0 - 15.0 g/dL   HCT 39.3 36.0 - 46.0 %   MCV 96.6 78.0 - 100.0 fL   MCH 29.2 26.0 - 34.0 pg   MCHC 30.3 30.0 - 36.0 g/dL   RDW 13.9 11.5 - 15.5 %   Platelets 174 150 - 400 K/uL  Comprehensive metabolic panel  Result Value Ref Range   Sodium 140 135 - 145 mmol/L   Potassium 3.5 3.5 - 5.1 mmol/L   Chloride 103 101 - 111 mmol/L   CO2 25 22 - 32 mmol/L   Glucose, Bld 121 (H) 65 - 99 mg/dL  BUN 14 6 - 20 mg/dL    Creatinine, Ser 1.05 (H) 0.44 - 1.00 mg/dL   Calcium 9.0 8.9 - 10.3 mg/dL   Total Protein 7.5 6.5 - 8.1 g/dL   Albumin 3.8 3.5 - 5.0 g/dL   AST 19 15 - 41 U/L   ALT 5 (L) 14 - 54 U/L   Alkaline Phosphatase 77 38 - 126 U/L   Total Bilirubin 0.6 0.3 - 1.2 mg/dL   GFR calc non Af Amer 55 (L) >60 mL/min   GFR calc Af Amer >60 >60 mL/min   Anion gap 12 5 - 15  Troponin I  Result Value Ref Range   Troponin I <0.03 <0.03 ng/mL  Differential  Result Value Ref Range   Neutrophils Relative % 44 %   Neutro Abs 3.3 1.7 - 7.7 K/uL   Lymphocytes Relative 41 %   Lymphs Abs 3.0 0.7 - 4.0 K/uL   Monocytes Relative 8 %   Monocytes Absolute 0.6 0.1 - 1.0 K/uL   Eosinophils Relative 6 %   Eosinophils Absolute 0.4 0.0 - 0.7 K/uL   Basophils Relative 1 %   Basophils Absolute 0.1 0.0 - 0.1 K/uL   Dg Chest 2 View Result Date: 11/19/2017 CLINICAL DATA:  Burning sensation in the middle of the chest. EXAM: CHEST  2 VIEW COMPARISON:  CT chest and chest radiograph 11/20/2016. FINDINGS: Trachea is midline. Heart size stable. Lungs are clear. No pleural fluid. Right hemidiaphragm is chronically elevated. IMPRESSION: No acute findings. Electronically Signed   By: Lorin Picket M.D.   On: 11/19/2017 08:05    1025:   Feels better after meds. Heart score 4; will observation admit. Dx and testing d/w pt and family.  Questions answered.  Verb understanding, agreeable to admit. T/C returned from [facility] Dr. Clementeen Graham, case discussed, including:  HPI, pertinent PM/SHx, VS/PE, dx testing, ED course and treatment:  Agreeable to admit.        Final Clinical Impressions(s) / ED Diagnoses   Final diagnoses:  None    ED Discharge Orders    None        Francine Graven, DO 11/22/17 2136

## 2017-11-19 NOTE — ED Notes (Signed)
Pt states her pain was not really changed by the Morphine, but it made her feel sick and does not want another dose at this time.

## 2017-11-19 NOTE — Progress Notes (Signed)
*  PRELIMINARY RESULTS* Echocardiogram 2D Echocardiogram has been performed.  Sabrina Curry 11/19/2017, 3:41 PM

## 2017-11-19 NOTE — ED Notes (Signed)
Pt up to bedside commode

## 2017-11-19 NOTE — H&P (Signed)
TRH H&P   Patient Demographics:    Sabrina Curry, is a 67 y.o. female  MRN: [id number]   DOB - [birth year]  Admit Date - 11/19/2017  Outpatient Primary MD for the patient is Rosita Fire, MD  Referring MD: Dr. Thurnell Garbe  Outpatient Specialists: None  Patient coming from: Home  Chief Complaint  Patient presents with  . Chest Pain      HPI:    Sabrina Curry  is a 67 y.o. female, with history of COPD with chronic respiratory failure on home O2, OSA on nighttime CPAP, history of SVT, hypertension, anxiety, morbid obesity, history of stroke in 2016 and thoracic aortic aneurysm who presented to the ED with acute onset of substernal chest pain radiating to the right arm since early this morning.  Patient was resting at home.  Reports pain to be sharp with 8/10 severity without aggravating or relieving factors.  Pain lasted for over an hour until she came to the ED and improved after receiving IV morphine and sublingual nitrate.  Reports some shortness of breath with it but no palpitations, no headache, dizziness, fevers, chills, nausea, vomiting, abdominal pain, dysuria or diarrhea.  Denies chest trauma, sick contact, recent illness or travel.  Denies similar symptoms in the past.  Denies having a stress test done in the past. Patient was hospitalized 1 year ago with chest pain symptoms thought to be atypical with unremarkable echo.  In the ED vitals were stable except for elevated blood pressure.  Blood work showed hemoglobin of 11.9, normal electrolytes, initial troponin was negative and EKG showed normal sinus rhythm at 97 with PVCs. Chest x-ray unremarkable. Patient was given a dose of IV morphine and sublingual nitrate and hospitalist consulted for observation for chest pain rule out.      Review of systems:    In addition to the HPI above,  No Fever-chills, No Headache, No  changes with Vision or hearing, No problems swallowing food or Liquids, Chest pain, shortness of breath, no cough No Abdominal pain, No Nausea or Vommitting, Bowel movements are regular, No Blood in stool or Urine, No dysuria, No new skin rashes or bruises, No new joints pains-aches,  No new weakness, tingling, numbness in any extremity, No recent weight gain or loss, No polyuria, polydypsia or polyphagia, No significant Mental Stressors.   With Past History of the following :    Past Medical History:  Diagnosis Date  . Anxiety   . Arthritis   . Asthma   . Chronic back pain   . COPD (chronic obstructive pulmonary disease) ([place])   . Edema of both legs    chronic  . GERD (gastroesophageal reflux disease)   . H/O dizziness   . H/O shortness of breath   . Headache(784.0)   . Hx of migraines   . Hypertension   . Knee pain, bilateral   .  Obstructive sleep apnea    On CPAP by nasal prong  . Palpitations 12/2012   ? SVT  . Seasonal allergies   . Stroke [facility])    last stoke Oct 2016  . Thoracic aortic aneurysm [facility]) 12/2012    12/2012: 4.3 cm fusiform aneurysm-ascending thoracic aorta  . Varicose veins of both legs with edema       Past Surgical History:  Procedure Laterality Date  . ABDOMINAL HYSTERECTOMY    . APPENDECTOMY     with gallbladder removal  . BACK SURGERY     had staff infection of back  . CATARACT EXTRACTION W/PHACO  01/16/2012   Procedure: CATARACT EXTRACTION PHACO AND INTRAOCULAR LENS PLACEMENT (IOC);  Surgeon: Elta Guadeloupe T. Gershon Crane, MD;  Location: [facility];  Service: Ophthalmology;  Laterality: Right;  CDE 6.12  . CHOLECYSTECTOMY     [facility]  . HERNIA REPAIR  2009   inscional hernia repair x2-3  . UMBILICAL HERNIA REPAIR        Social History:     Social History   Tobacco Use  . Smoking status: Never Smoker  . Smokeless tobacco: Never Used  Substance Use Topics  . Alcohol use: No    Alcohol/week: 0.0 oz     Lives -home  Mobility -sedentary  lifestyle and ambulates with a walker     Family History :     Family History  Problem Relation Age of Onset  . Hypertension Mother   . Diabetes Mother   . Hyperlipidemia Father        Also hypertension and an aneurysm  . Anesthesia problems Neg Hx   . Hypotension Neg Hx   . Pseudochol deficiency Neg Hx   . Malignant hyperthermia Neg Hx       Home Medications:   Prior to Admission medications   Medication Sig Start Date End Date Taking? Authorizing Provider  albuterol (PROAIR HFA) 108 (90 Base) MCG/ACT inhaler Inhale 1 puff into the lungs every 6 (six) hours as needed for wheezing or shortness of breath.   Yes [provider]  amLODipine (NORVASC) 10 MG tablet TAKE ONE TABLET BY MOUTH DAILY. 06/21/15  Yes Herminio Commons, MD  aspirin 81 MG tablet Take 2 tablets (162 mg total) by mouth daily. 06/25/15  Yes Kathie Dike, MD  budesonide-formoterol (SYMBICORT) 160-4.5 MCG/ACT inhaler Inhale 2 puffs into the lungs daily.   Yes [provider]  hydrALAZINE (APRESOLINE) 50 MG tablet Take 1.5 tablets (75 mg total) by mouth 3 (three) times daily. 05/22/17 11/19/17 Yes Herminio Commons, MD  isosorbide mononitrate (IMDUR) 30 MG 24 hr tablet TAKE (1) TABLET BY MOUTH ONCE DAILY. 08/20/17  Yes Herminio Commons, MD  lisinopril (PRINIVIL,ZESTRIL) 40 MG tablet Take 1 tablet (40 mg total) by mouth daily. 11/16/14  Yes Herminio Commons, MD  metolazone (ZAROXOLYN) 5 MG tablet Take 5 mg daily for 3 days and then STOP 05/22/17  Yes Herminio Commons, MD  metoprolol (LOPRESSOR) 100 MG tablet TAKE ONE TABLET BY MOUTH TWICE DAILY. 04/19/15  Yes Herminio Commons, MD  oxyCODONE-acetaminophen (PERCOCET/ROXICET) 5-325 MG tablet Take 1 tablet by mouth daily as needed for moderate pain or severe pain.   Yes [provider]  OXYGEN Inhale into the lungs.   Yes [provider]  pantoprazole (PROTONIX) 40 MG tablet Take 1 tablet (40 mg total) by mouth daily.  06/25/15  Yes Kathie Dike, MD  potassium chloride (K-DUR) 10 MEQ tablet TAKE ONE TABLET BY MOUTH TWICE  DAILY. 08/20/17  Yes Herminio Commons, MD  promethazine (PHENERGAN) 25 MG tablet Take 25 mg by mouth every 6 (six) hours as needed for nausea or vomiting.   Yes [provider]  vitamin B-12 (CYANOCOBALAMIN) 1000 MCG tablet Take 1 tablet (1,000 mcg total) by mouth daily. 06/25/15  Yes Kathie Dike, MD     Allergies:     Allergies  Allergen Reactions  . Vancomycin Cross Reactors Anaphylaxis     Physical Exam:   Vitals  Blood pressure (!) 162/93, pulse 87, temperature 98.7 F (37.1 C), temperature source Oral, resp. rate 19, height 4' 11"  (1.499 m), weight 122.5 kg (270 lb), SpO2 99 %.   General: Middle-aged morbidly obese female not in distress HEENT: Pupils reactive bilaterally, EOMI, no pallor, no icterus, moist oral mucosa, supple neck Chest: Scattered wheezing bilaterally, no crackles, reproducible pain on pressure over the right chest CVS: Normal S1 and S2, no murmurs rubs or gallop GI: Soft, nondistended, nontender, bowel sounds present Musculoskeletal: Warm, trace pitting edema bilaterally, normal skin CNS: Alert and oriented, nonfocal    Data Review:    CBC Recent Labs  Lab 11/19/17 0743  WBC 7.2  HGB 11.9*  HCT 39.3  PLT 174  MCV 96.6  MCH 29.2  MCHC 30.3  RDW 13.9  LYMPHSABS 3.0  MONOABS 0.6  EOSABS 0.4  BASOSABS 0.1   ------------------------------------------------------------------------------------------------------------------  Chemistries  Recent Labs  Lab 11/19/17 0743  NA 140  K 3.5  CL 103  CO2 25  GLUCOSE 121*  BUN 14  CREATININE 1.05*  CALCIUM 9.0  AST 19  ALT 5*  ALKPHOS 77  BILITOT 0.6   ------------------------------------------------------------------------------------------------------------------ estimated creatinine clearance is 64 mL/min (A) (by C-G formula based on SCr of 1.05 mg/dL  (H)). ------------------------------------------------------------------------------------------------------------------ No results for input(s): TSH, T4TOTAL, T3FREE, THYROIDAB in the last 72 hours.  Invalid input(s): FREET3  Coagulation profile No results for input(s): INR, PROTIME in the last 168 hours. ------------------------------------------------------------------------------------------------------------------- No results for input(s): DDIMER in the last 72 hours. -------------------------------------------------------------------------------------------------------------------  Cardiac Enzymes Recent Labs  Lab 11/19/17 0743  TROPONINI <0.03   ------------------------------------------------------------------------------------------------------------------    Component Value Date/Time   BNP 172.0 (H) 02/06/2016 1723     ---------------------------------------------------------------------------------------------------------------  Urinalysis    Component Value Date/Time   COLORURINE YELLOW 06/23/2015 2350   APPEARANCEUR HAZY (A) 06/23/2015 2350   LABSPEC 1.015 06/23/2015 2350   PHURINE 6.0 06/23/2015 2350   GLUCOSEU NEGATIVE 06/23/2015 2350   HGBUR NEGATIVE 06/23/2015 2350   BILIRUBINUR NEGATIVE 06/23/2015 2350   KETONESUR NEGATIVE 06/23/2015 2350   PROTEINUR NEGATIVE 06/23/2015 2350   UROBILINOGEN 0.2 06/23/2015 2350   NITRITE POSITIVE (A) 06/23/2015 2350   LEUKOCYTESUR SMALL (A) 06/23/2015 2350    ----------------------------------------------------------------------------------------------------------------   Imaging Results:    Dg Chest 2 View  Result Date: 11/19/2017 CLINICAL DATA:  Burning sensation in the middle of the chest. EXAM: CHEST  2 VIEW COMPARISON:  CT chest and chest radiograph 11/20/2016. FINDINGS: Trachea is midline. Heart size stable. Lungs are clear. No pleural fluid. Right hemidiaphragm is chronically elevated. IMPRESSION: No acute  findings. Electronically Signed   By: Lorin Picket M.D.   On: 11/19/2017 08:05    My personal review of EKG: Normal sinus rhythm at 97, PVCs   Assessment & Plan:    Principal Problem:   Chest pain at rest Has mostly atypical symptoms and possibly musculoskeletal versus respiratory with associated wheezing..  Pain reproducible on pressure over the right chest.  Heart score is 4  with other comorbidities including hypertension, morbid obesity and history of stroke. Patient had a normal dobutamine stress echo in 2013. Placed on observation on telemetry.  Cycle serial troponins and EKG.  Sublingual nitroglycerin for pain.  (Currently resolved).  Will place on full dose aspirin and resume her home dose metoprolol, lisinopril and Imdur.  Also on metolazone at home which is continued. Check 2D echo. -Consult cardiology if elevated troponin or EKG changes.  Active Problems:   Morbid obesity ([place]) Obstructive sleep apnea Nighttime CPAP.  Essential hypertension Elevated blood pressure on presentation.  Resume home  dose metoprolol, Imdur, lisinopril and amlodipine.    Thoracic aortic aneurysm [facility]) Needs outpatient follow-up.    COPD (chronic obstructive pulmonary disease) ([place]) As scattered bilateral wheezing on exam.  Resume home inhaler and placed on as needed duo nebs. Continue O2 via nasal cannula.   History of stroke On aspirin 162 mg daily at home.  Check lipid panel    1.    DVT Prophylaxis   Lovenox -   AM Labs Ordered, also please review Full Orders  Family Communication: Admission, patients condition and plan of care including tests being ordered have been discussed with the patient  Code Status : full code  Likely DC to home  Condition GUARDED    Consults called: none  Admission status: observation     Time spent in minutes :45   Bayler Gehrig M.D on 11/19/2017 at 12:12 PM  Between 7am to 7pm - Pager - [phone]. After 7pm go to [email or web address] -  password [facility]  [facility] - Office  [phone]

## 2017-11-19 NOTE — Progress Notes (Signed)
Patient does not own a CPAP at home but wears 2lpm [place] at all times. She stated she was claustrophobic and could not tolerate a mask on her face. Rt will continue to monitor patient while she wears 2lpm [place] in the hospital

## 2017-11-20 DIAGNOSIS — Z6841 Body Mass Index (BMI) 40.0 and over, adult: Secondary | ICD-10-CM

## 2017-11-20 DIAGNOSIS — R0789 Other chest pain: Secondary | ICD-10-CM | POA: Diagnosis not present

## 2017-11-20 DIAGNOSIS — J441 Chronic obstructive pulmonary disease with (acute) exacerbation: Secondary | ICD-10-CM | POA: Diagnosis present

## 2017-11-20 LAB — LIPID PANEL
CHOL/HDL RATIO: 2.9 ratio
CHOLESTEROL: 131 mg/dL (ref 0–200)
HDL: 45 mg/dL (ref >40)
LDL Cholesterol: 58 mg/dL (ref 0–99)
Triglycerides: 139 mg/dL (ref <150)
VLDL: 28 mg/dL (ref 0–40)

## 2017-11-20 LAB — TROPONIN I: Troponin I: <0.03 ng/mL (ref <0.03)

## 2017-11-20 MED ORDER — AMLODIPINE BESYLATE 10 MG PO TABS: 5.0000 mg | ORAL_TABLET | Freq: Every day | ORAL | 11 refills | Status: DC

## 2017-11-20 MED ORDER — PREDNISONE 20 MG PO TABS: 40.0000 mg | ORAL_TABLET | Freq: Every day | ORAL | 0 refills | Status: AC

## 2017-11-20 MED ORDER — IPRATROPIUM-ALBUTEROL 0.5-2.5 (3) MG/3ML IN SOLN
3.0000 mL | RESPIRATORY_TRACT | Status: DC
  Administered 2017-11-20 (×2): 3 mL via RESPIRATORY_TRACT
  Filled 2017-11-20 (×2): qty 3

## 2017-11-20 MED ORDER — PREDNISONE 20 MG PO TABS
40.0000 mg | ORAL_TABLET | Freq: Every day | ORAL | Status: DC
  Administered 2017-11-20: 40 mg via ORAL
  Filled 2017-11-20: qty 2

## 2017-11-20 MED ORDER — METOPROLOL TARTRATE 100 MG PO TABS: 50.0000 mg | ORAL_TABLET | Freq: Two times a day (BID) | ORAL | 3 refills | Status: DC

## 2017-11-20 NOTE — Discharge Summary (Signed)
Physician Discharge Summary  Brandilyn Nanninga EXN:[id number] DOB: September 13, 1951 DOA: 11/19/2017  PCP: Rosita Fire, MD  Admitted From: Home Disposition: Home  Recommendations for Outpatient Follow-up:  1. Follow up with PCP in 1-2 weeks.  Patient is being discharged on 5-day course of oral prednisone.   Home Health: None Equipment/Devices:Chronic 2 L oxygen via nasal cannula  Discharge Condition: Fair CODE STATUS: DNR Diet recommendation: Heart Healthy     Discharge Diagnoses:  Principal Problem:   Chest pain at rest, atypical  Active Problems:    COPD with acute exacerbation ([place])   Morbid obesity ([place])   Hypertension   Thoracic aortic aneurysm ([place])   Morbid obesity with BMI of 50.0-59.9, adult [facility])  Brief narrative/HPI 67 y.o. female, with history of COPD with chronic respiratory failure on home O2, OSA on nighttime CPAP, history of SVT, hypertension, anxiety, morbid obesity, history of stroke in 2016 and thoracic aortic aneurysm who presented to the ED with acute onset of substernal chest pain radiating to the right arm since early this morning.  Patient was resting at home.  Reports pain to be sharp with 8/10 severity without aggravating or relieving factors.  Pain lasted for over an hour until she came to the ED and improved after receiving IV morphine and sublingual nitrate.  Reports some shortness of breath with it but no palpitations, no headache, dizziness, fevers, chills, nausea, vomiting, abdominal pain, dysuria or diarrhea.  Denies chest trauma, sick contact, recent illness or travel.  Denies similar symptoms in the past.  Denies having a stress test done in the past. Patient was hospitalized 1 year ago with chest pain symptoms thought to be atypical with unremarkable echo.  In the ED vitals were stable except for elevated blood pressure.  Blood work showed hemoglobin of 11.9, normal electrolytes, initial troponin was negative and EKG showed normal sinus rhythm at 97 with  PVCs. Chest x-ray unremarkable. Patient was given a dose of IV morphine and sublingual nitrate and hospitalist consulted for observation for chest pain rule out.  Hospital course Chest pain, atypical Likely musculoskeletal versus respiratory symptoms with associated wheezing and mild COPD exacerbation.  Pain is reproducible on pressure over the right chest.  She had a normal dobutamine stress echo in 2013.  Serial troponins were negative and EKG unremarkable.  Stable on telemetry. 2D echo showed findings of normal LVEF and wall motion abnormality not detected.  Showed findings of LVH. Chest pain symptoms have markedly improved today.  Further workup not needed.  COPD with mild exacerbation Wheezy on exam this morning.  Given 2 rounds of DuoNeb with improvement.  Also placed her on 5-day course of oral prednisone.  Continue home inhaler and nebs.  Essential hypertension Had soft to low normal blood pressure.  She is on multiple blood pressure medications including amlodipine, hydralazine, metoprolol, lisinopril, Imdur and metolazone. I have discontinued her hydralazine and metolazone (not sure if she has been taking them). I have reduced her metoprolol and amlodipine dose and continue Imdur and lisinopril. Follow-up with PCP.  Remaining medical issues are stable.  Continue all other home meds. Stable for discharge home.  Procedure: 2D echo Consults: None Family medication: None at bedside    Discharge Instructions   Allergies as of 11/20/2017      Reactions   Vancomycin Cross Reactors Anaphylaxis      Medication List    STOP taking these medications   hydrALAZINE 50 MG tablet Commonly known as:  APRESOLINE   metolazone 5 MG tablet  Commonly known as:  ZAROXOLYN     TAKE these medications   amLODipine 10 MG tablet Commonly known as:  NORVASC Take 0.5 tablets (5 mg total) by mouth daily. What changed:  how much to take   aspirin 81 MG tablet Take 2 tablets (162 mg  total) by mouth daily.   isosorbide mononitrate 30 MG 24 hr tablet Commonly known as:  IMDUR TAKE (1) TABLET BY MOUTH ONCE DAILY.   lisinopril 40 MG tablet Commonly known as:  PRINIVIL,ZESTRIL Take 1 tablet (40 mg total) by mouth daily.   metoprolol tartrate 100 MG tablet Commonly known as:  LOPRESSOR Take 0.5 tablets (50 mg total) by mouth 2 (two) times daily. What changed:  how much to take   oxyCODONE-acetaminophen 5-325 MG tablet Commonly known as:  PERCOCET/ROXICET Take 1 tablet by mouth daily as needed for moderate pain or severe pain.   OXYGEN Inhale into the lungs.   pantoprazole 40 MG tablet Commonly known as:  PROTONIX Take 1 tablet (40 mg total) by mouth daily.   potassium chloride 10 MEQ tablet Commonly known as:  K-DUR TAKE ONE TABLET BY MOUTH TWICE DAILY.   predniSONE 20 MG tablet Commonly known as:  DELTASONE Take 2 tablets (40 mg total) by mouth daily with breakfast for 4 days. Start taking on:  11/21/2017   PROAIR HFA 108 (90 Base) MCG/ACT inhaler Generic drug:  albuterol Inhale 1 puff into the lungs every 6 (six) hours as needed for wheezing or shortness of breath.   promethazine 25 MG tablet Commonly known as:  PHENERGAN Take 25 mg by mouth every 6 (six) hours as needed for nausea or vomiting.   SYMBICORT 160-4.5 MCG/ACT inhaler Generic drug:  budesonide-formoterol Inhale 2 puffs into the lungs daily.   vitamin B-12 1000 MCG tablet Commonly known as:  CYANOCOBALAMIN Take 1 tablet (1,000 mcg total) by mouth daily.      Follow-up Information    Rosita Fire, MD. Schedule an appointment as soon as possible for a visit in 1 week(s).   Specialty:  Internal Medicine Contact information: [place] [address] [phone]          Allergies  Allergen Reactions  . Vancomycin Cross Reactors Anaphylaxis       Procedures/Studies: Dg Chest 2 View  Result Date: 11/19/2017 CLINICAL DATA:  Burning sensation in the  middle of the chest. EXAM: CHEST  2 VIEW COMPARISON:  CT chest and chest radiograph 11/20/2016. FINDINGS: Trachea is midline. Heart size stable. Lungs are clear. No pleural fluid. Right hemidiaphragm is chronically elevated. IMPRESSION: No acute findings. Electronically Signed   By: Lorin Picket M.D.   On: 11/19/2017 08:05    2D echo Study Conclusions  - Left ventricle: The cavity size was normal. Wall thickness was   increased in a pattern of mild LVH. Systolic function was normal.   The estimated ejection fraction was in the range of 60% to 65%.   Wall motion was normal; there were no regional wall motion   abnormalities. The study is not technically sufficient to allow   evaluation of LV diastolic function. - Aortic valve: Mildly calcified annulus. Trileaflet; normal   thickness leaflets. Valve area (VTI): 2.84 cm^2. Valve area   (Vmax): 2.36 cm^2. Valve area (Vmean): 2.43 cm^2. - Technically difficult study. Echocontrast was used to enhance   visualization.   Subjective:   Discharge Exam: Vitals:   11/20/17 1535 11/20/17 1611  BP:  134/73  Pulse:  77  Resp:    Temp:    SpO2: 96%    Vitals:   11/20/17 1117 11/20/17 1300 11/20/17 1535 11/20/17 1611  BP:  120/66  134/73  Pulse:  68  77  Resp:      Temp:  98.7 F (37.1 C)    TempSrc:  Oral    SpO2: 97% 98% 96%   Weight:      Height:        General: Middle aged obese female not in distress HEENT: Moist mucosa, supple neck Chest: Bilateral scattered wheezing, reproducible pain on pressure over right chest CVS: Normal S1 and S2, no murmurs GI: Soft, nondistended, nontender Musculoskeletal: Warm, trace edema      The results of significant diagnostics from this hospitalization (including imaging, microbiology, ancillary and laboratory) are listed below for reference.     Microbiology: No results found for this or any previous visit (from the past 240 hour(s)).   Labs: BNP (last 3 results) No results for  input(s): BNP in the last 8760 hours. Basic Metabolic Panel: Recent Labs  Lab 11/19/17 0743  NA 140  K 3.5  CL 103  CO2 25  GLUCOSE 121*  BUN 14  CREATININE 1.05*  CALCIUM 9.0   Liver Function Tests: Recent Labs  Lab 11/19/17 0743  AST 19  ALT 5*  ALKPHOS 77  BILITOT 0.6  PROT 7.5  ALBUMIN 3.8   No results for input(s): LIPASE, AMYLASE in the last 168 hours. No results for input(s): AMMONIA in the last 168 hours. CBC: Recent Labs  Lab 11/19/17 0743  WBC 7.2  NEUTROABS 3.3  HGB 11.9*  HCT 39.3  MCV 96.6  PLT 174   Cardiac Enzymes: Recent Labs  Lab 11/19/17 0743 11/19/17 1217 11/19/17 1847 11/19/17 2356  TROPONINI <0.03 <0.03 <0.03 <0.03   BNP: Invalid input(s): POCBNP CBG: No results for input(s): GLUCAP in the last 168 hours. D-Dimer No results for input(s): DDIMER in the last 72 hours. Hgb A1c No results for input(s): HGBA1C in the last 72 hours. Lipid Profile Recent Labs    11/20/17 0609  CHOL 131  HDL 45  LDLCALC 58  TRIG 139  CHOLHDL 2.9   Thyroid function studies No results for input(s): TSH, T4TOTAL, T3FREE, THYROIDAB in the last 72 hours.  Invalid input(s): FREET3 Anemia work up No results for input(s): VITAMINB12, FOLATE, FERRITIN, TIBC, IRON, RETICCTPCT in the last 72 hours. Urinalysis  Sepsis Labs Invalid input(s): PROCALCITONIN,  WBC,  LACTICIDVEN Microbiology No results found for this or any previous visit (from the past 240 hour(s)).   Time coordinating discharge: < 30 minutes  SIGNED:   Louellen Molder, MD  [facility] 11/20/2017, 4:22 PM Pager   If 7PM-7AM, please contact night-coverage [email or web address] Password [email or web address]

## 2017-11-20 NOTE — Progress Notes (Signed)
IV removed by Tobin Chad, NT, reviewed AVS with patient and patient's daughter both verbalized understanding.  Patient to be transported home by [facility].

## 2017-11-21 ENCOUNTER — Ambulatory Visit: Payer: Medicaid Other | Admitting: Orthopedic Surgery

## 2017-11-25 NOTE — Progress Notes (Signed)
NEW PATIENT OFFICE VISIT   Chief Complaint  Patient presents with  . Knee Pain    right     67 year old female presents for evaluation of ongoing right knee pain  She complains of severe medial right knee pain for about 1 year associated with difficulty walking and ambulating  She has severe COPD is on chronic oxygen and has severe morbid obesity    Review of Systems  Constitutional: Negative for chills and fever.  Respiratory: Positive for shortness of breath.   Musculoskeletal: Positive for joint pain.     Past Medical History:  Diagnosis Date  . Anxiety   . Arthritis   . Asthma   . Chronic back pain   . COPD (chronic obstructive pulmonary disease) ([place])   . Edema of both legs    chronic  . GERD (gastroesophageal reflux disease)   . H/O dizziness   . H/O shortness of breath   . Headache(784.0)   . Hx of migraines   . Hypertension   . Knee pain, bilateral   . Obstructive sleep apnea    On CPAP by nasal prong  . Palpitations 12/2012   ? SVT  . Seasonal allergies   . Stroke [facility])    last stoke Oct 2016  . Thoracic aortic aneurysm [facility]) 12/2012    12/2012: 4.3 cm fusiform aneurysm-ascending thoracic aorta  . Varicose veins of both legs with edema     Past Surgical History:  Procedure Laterality Date  . ABDOMINAL HYSTERECTOMY    . APPENDECTOMY     with gallbladder removal  . BACK SURGERY     had staff infection of back  . CATARACT EXTRACTION W/PHACO  01/16/2012   Procedure: CATARACT EXTRACTION PHACO AND INTRAOCULAR LENS PLACEMENT (IOC);  Surgeon: Elta Guadeloupe T. Gershon Crane, MD;  Location: [facility];  Service: Ophthalmology;  Laterality: Right;  CDE 6.12  . CHOLECYSTECTOMY     [facility]  . HERNIA REPAIR  2009   inscional hernia repair x2-3  . UMBILICAL HERNIA REPAIR      Family History  Problem Relation Age of Onset  . Hypertension Mother   . Diabetes Mother   . Hyperlipidemia Father        Also hypertension and an aneurysm  . Anesthesia problems Neg Hx   .  Hypotension Neg Hx   . Pseudochol deficiency Neg Hx   . Malignant hyperthermia Neg Hx    Social History   Tobacco Use  . Smoking status: Never Smoker  . Smokeless tobacco: Never Used  Substance Use Topics  . Alcohol use: No    Alcohol/week: 0.0 oz  . Drug use: No    Allergies  Allergen Reactions  . Vancomycin Cross Reactors Anaphylaxis    No outpatient medications have been marked as taking for the 11/26/17 encounter (Appointment) with Carole Civil, MD.    BP (!) 177/97   Pulse 67   Ht 5' (1.524 m)   Wt 279 lb (126.6 kg)   BMI 54.49 kg/m   Physical Exam  Constitutional: She is oriented to person, place, and time. She appears well-developed and well-nourished.  Musculoskeletal:       Right knee: She exhibits no effusion.       Left knee: She exhibits no effusion.  Neurological: She is alert and oriented to person, place, and time.  Psychiatric: She has a normal mood and affect. Judgment normal.  Vitals reviewed.   Right Knee Exam   Muscle Strength  The patient has normal right  knee strength.  Tenderness  The patient is experiencing tenderness in the medial joint line.  Range of Motion  Extension: normal  Flexion:  120 normal   Tests  McMurray:  Medial - negative Lateral - negative Varus: negative Valgus: negative Drawer:  Anterior - negative    Posterior - negative  Other  Erythema: absent Scars: absent Sensation: normal Pulse: present Swelling: none Effusion: no effusion present   Left Knee Exam   Muscle Strength  The patient has normal left knee strength.  Tenderness  The patient is experiencing no tenderness.   Range of Motion  Extension: normal  Flexion:  120 normal   Tests  McMurray:  Medial - negative Lateral - negative Varus: negative Valgus: negative Drawer:  Anterior - negative     Posterior - negative  Other  Erythema: absent Scars: absent Sensation: normal Pulse: present Swelling: none Effusion: no effusion  present      MEDICAL DECISION SECTION  xrays ordered?  Yes  My independent reading of xrays: Osteoarthritis right knee severe varus and subluxation   Encounter Diagnoses  Name Primary?  . Chronic pain of right knee   . Primary osteoarthritis of right knee Yes     PLAN:   No orders of the defined types were placed in this encounter.  Injection? YES  Procedure note right knee injection verbal consent was obtained to inject right knee joint  Timeout was completed to confirm the site of injection  The medications used were 40 mg of Depo-Medrol and 1% lidocaine 3 cc  Anesthesia was provided by ethyl chloride and the skin was prepped with alcohol.  After cleaning the skin with alcohol a 20-gauge needle was used to inject the right knee joint. There were no complications. A sterile bandage was applied.

## 2017-11-26 ENCOUNTER — Ambulatory Visit (INDEPENDENT_AMBULATORY_CARE_PROVIDER_SITE_OTHER): Payer: Medicaid Other

## 2017-11-26 ENCOUNTER — Encounter: Payer: Self-pay | Admitting: Orthopedic Surgery

## 2017-11-26 ENCOUNTER — Ambulatory Visit: Payer: Medicaid Other | Admitting: Orthopedic Surgery

## 2017-11-26 VITALS — BP 177/97 | HR 67 | Ht 60.0 in | Wt 279.0 lb

## 2017-11-26 DIAGNOSIS — G8929 Other chronic pain: Secondary | ICD-10-CM

## 2017-11-26 DIAGNOSIS — M1711 Unilateral primary osteoarthritis, right knee: Secondary | ICD-10-CM | POA: Diagnosis not present

## 2017-11-26 DIAGNOSIS — M25561 Pain in right knee: Secondary | ICD-10-CM

## 2017-11-26 NOTE — Patient Instructions (Signed)

## 2017-12-19 ENCOUNTER — Ambulatory Visit: Payer: Medicaid Other | Admitting: Orthopedic Surgery

## 2018-01-04 ENCOUNTER — Emergency Department (HOSPITAL_COMMUNITY): Payer: Medicaid Other

## 2018-01-04 ENCOUNTER — Inpatient Hospital Stay (HOSPITAL_COMMUNITY)
Admission: EM | Admit: 2018-01-04 | Discharge: 2018-01-07 | DRG: 392 | Disposition: A | Discharge status: Home / Self Care | Payer: Medicaid Other | Attending: Internal Medicine | Admitting: Internal Medicine

## 2018-01-04 ENCOUNTER — Encounter (HOSPITAL_COMMUNITY): Payer: Self-pay | Admitting: Emergency Medicine

## 2018-01-04 ENCOUNTER — Other Ambulatory Visit: Payer: Self-pay

## 2018-01-04 DIAGNOSIS — R0602 Shortness of breath: Secondary | ICD-10-CM

## 2018-01-04 DIAGNOSIS — I10 Essential (primary) hypertension: Secondary | ICD-10-CM | POA: Diagnosis present

## 2018-01-04 DIAGNOSIS — K219 Gastro-esophageal reflux disease without esophagitis: Secondary | ICD-10-CM | POA: Diagnosis present

## 2018-01-04 DIAGNOSIS — R059 Cough, unspecified: Secondary | ICD-10-CM

## 2018-01-04 DIAGNOSIS — J449 Chronic obstructive pulmonary disease, unspecified: Secondary | ICD-10-CM | POA: Diagnosis not present

## 2018-01-04 DIAGNOSIS — J441 Chronic obstructive pulmonary disease with (acute) exacerbation: Secondary | ICD-10-CM | POA: Diagnosis present

## 2018-01-04 DIAGNOSIS — Z7951 Long term (current) use of inhaled steroids: Secondary | ICD-10-CM

## 2018-01-04 DIAGNOSIS — R110 Nausea: Secondary | ICD-10-CM

## 2018-01-04 DIAGNOSIS — Z8673 Personal history of transient ischemic attack (TIA), and cerebral infarction without residual deficits: Secondary | ICD-10-CM

## 2018-01-04 DIAGNOSIS — G8929 Other chronic pain: Secondary | ICD-10-CM | POA: Diagnosis present

## 2018-01-04 DIAGNOSIS — Z7982 Long term (current) use of aspirin: Secondary | ICD-10-CM

## 2018-01-04 DIAGNOSIS — R05 Cough: Secondary | ICD-10-CM

## 2018-01-04 DIAGNOSIS — K5000 Crohn's disease of small intestine without complications: Secondary | ICD-10-CM | POA: Diagnosis not present

## 2018-01-04 DIAGNOSIS — Z6841 Body Mass Index (BMI) 40.0 and over, adult: Secondary | ICD-10-CM

## 2018-01-04 DIAGNOSIS — R1013 Epigastric pain: Secondary | ICD-10-CM | POA: Diagnosis not present

## 2018-01-04 DIAGNOSIS — G4733 Obstructive sleep apnea (adult) (pediatric): Secondary | ICD-10-CM | POA: Diagnosis present

## 2018-01-04 DIAGNOSIS — Z66 Do not resuscitate: Secondary | ICD-10-CM | POA: Diagnosis present

## 2018-01-04 DIAGNOSIS — I712 Thoracic aortic aneurysm, without rupture, unspecified: Secondary | ICD-10-CM | POA: Diagnosis present

## 2018-01-04 DIAGNOSIS — K529 Noninfective gastroenteritis and colitis, unspecified: Secondary | ICD-10-CM | POA: Diagnosis present

## 2018-01-04 DIAGNOSIS — R079 Chest pain, unspecified: Secondary | ICD-10-CM | POA: Diagnosis not present

## 2018-01-04 DIAGNOSIS — Z79899 Other long term (current) drug therapy: Secondary | ICD-10-CM

## 2018-01-04 DIAGNOSIS — M549 Dorsalgia, unspecified: Secondary | ICD-10-CM | POA: Diagnosis present

## 2018-01-04 LAB — CBC
HCT: 42.7 % (ref 36.0–46.0)
HEMOGLOBIN: 13.3 g/dL (ref 12.0–15.0)
MCH: 29.3 pg (ref 26.0–34.0)
MCHC: 31.1 g/dL (ref 30.0–36.0)
MCV: 94.1 fL (ref 78.0–100.0)
Platelets: 215 10*3/uL (ref 150–400)
RBC: 4.54 MIL/uL (ref 3.87–5.11)
RDW: 13.9 % (ref 11.5–15.5)
WBC: 10 10*3/uL (ref 4.0–10.5)

## 2018-01-04 LAB — COMPREHENSIVE METABOLIC PANEL
ALBUMIN: 4 g/dL (ref 3.5–5.0)
ALK PHOS: 94 U/L (ref 38–126)
ALT: 5 U/L — AB (ref 14–54)
AST: 16 U/L (ref 15–41)
Anion gap: 15 (ref 5–15)
BILIRUBIN TOTAL: 1 mg/dL (ref 0.3–1.2)
BUN: 17 mg/dL (ref 6–20)
CO2: 29 mmol/L (ref 22–32)
Calcium: 10.3 mg/dL (ref 8.9–10.3)
Chloride: 96 mmol/L — ABNORMAL LOW (ref 101–111)
Creatinine, Ser: 1.12 mg/dL — ABNORMAL HIGH (ref 0.44–1.00)
GFR calc Af Amer: 59 mL/min — ABNORMAL LOW (ref >60)
GFR, EST NON AFRICAN AMERICAN: 51 mL/min — AB (ref >60)
Glucose, Bld: 142 mg/dL — ABNORMAL HIGH (ref 65–99)
POTASSIUM: 3.7 mmol/L (ref 3.5–5.1)
Sodium: 140 mmol/L (ref 135–145)
Total Protein: 8.3 g/dL — ABNORMAL HIGH (ref 6.5–8.1)

## 2018-01-04 LAB — LIPASE, BLOOD: Lipase: 31 U/L (ref 11–51)

## 2018-01-04 LAB — URINALYSIS, ROUTINE W REFLEX MICROSCOPIC
BILIRUBIN URINE: NEGATIVE
Glucose, UA: NEGATIVE mg/dL
HGB URINE DIPSTICK: NEGATIVE
Ketones, ur: NEGATIVE mg/dL
Leukocytes, UA: NEGATIVE
NITRITE: NEGATIVE
PROTEIN: 30 mg/dL — AB
SPECIFIC GRAVITY, URINE: 1.012 (ref 1.005–1.030)
pH: 7 (ref 5.0–8.0)

## 2018-01-04 LAB — I-STAT TROPONIN, ED
TROPONIN I, POC: 0 ng/mL (ref 0.00–0.08)
Troponin i, poc: 0 ng/mL (ref 0.00–0.08)

## 2018-01-04 LAB — BRAIN NATRIURETIC PEPTIDE: B Natriuretic Peptide: 171 pg/mL — ABNORMAL HIGH (ref 0.0–100.0)

## 2018-01-04 LAB — TROPONIN I: Troponin I: <0.03 ng/mL (ref <0.03)

## 2018-01-04 MED ORDER — METHYLPREDNISOLONE SODIUM SUCC 125 MG IJ SOLR
60.0000 mg | Freq: Four times a day (QID) | INTRAMUSCULAR | Status: DC
  Administered 2018-01-04 – 2018-01-07 (×12): 60 mg via INTRAVENOUS
  Filled 2018-01-04 (×10): qty 2

## 2018-01-04 MED ORDER — GI COCKTAIL ~~LOC~~
30.0000 mL | Freq: Once | ORAL | Status: AC
  Administered 2018-01-04: 30 mL via ORAL
  Filled 2018-01-04: qty 30

## 2018-01-04 MED ORDER — METHYLPREDNISOLONE SODIUM SUCC 125 MG IJ SOLR
80.0000 mg | Freq: Once | INTRAMUSCULAR | Status: AC
  Administered 2018-01-04: 80 mg via INTRAVENOUS
  Filled 2018-01-04: qty 2

## 2018-01-04 MED ORDER — FAMOTIDINE IN NACL 20-0.9 MG/50ML-% IV SOLN
20.0000 mg | Freq: Once | INTRAVENOUS | Status: AC
  Administered 2018-01-04: 20 mg via INTRAVENOUS
  Filled 2018-01-04: qty 50

## 2018-01-04 MED ORDER — CIPROFLOXACIN IN D5W 400 MG/200ML IV SOLN
400.0000 mg | Freq: Two times a day (BID) | INTRAVENOUS | Status: DC
  Administered 2018-01-04 – 2018-01-07 (×6): 400 mg via INTRAVENOUS
  Filled 2018-01-04 (×6): qty 200

## 2018-01-04 MED ORDER — IPRATROPIUM-ALBUTEROL 0.5-2.5 (3) MG/3ML IN SOLN
3.0000 mL | Freq: Four times a day (QID) | RESPIRATORY_TRACT | Status: DC
  Administered 2018-01-04 (×2): 3 mL via RESPIRATORY_TRACT
  Filled 2018-01-04 (×2): qty 3

## 2018-01-04 MED ORDER — PROMETHAZINE HCL 12.5 MG PO TABS
25.0000 mg | ORAL_TABLET | Freq: Four times a day (QID) | ORAL | Status: DC | PRN
  Administered 2018-01-05 (×2): 25 mg via ORAL
  Filled 2018-01-04 (×2): qty 2

## 2018-01-04 MED ORDER — ONDANSETRON HCL 4 MG/2ML IJ SOLN
4.0000 mg | Freq: Once | INTRAMUSCULAR | Status: AC
  Administered 2018-01-04: 4 mg via INTRAVENOUS
  Filled 2018-01-04: qty 2

## 2018-01-04 MED ORDER — PANTOPRAZOLE SODIUM 40 MG PO TBEC: 80.0000 mg | DELAYED_RELEASE_TABLET | Freq: Two times a day (BID) | ORAL | Status: DC

## 2018-01-04 MED ORDER — ONDANSETRON HCL 4 MG/2ML IJ SOLN
4.0000 mg | Freq: Four times a day (QID) | INTRAMUSCULAR | Status: DC | PRN
  Administered 2018-01-04 – 2018-01-05 (×2): 4 mg via INTRAVENOUS
  Filled 2018-01-04 (×2): qty 2

## 2018-01-04 MED ORDER — AMLODIPINE BESYLATE 5 MG PO TABS
5.0000 mg | ORAL_TABLET | Freq: Every day | ORAL | Status: DC
  Administered 2018-01-04 – 2018-01-07 (×4): 5 mg via ORAL
  Filled 2018-01-04 (×4): qty 1

## 2018-01-04 MED ORDER — HYDRALAZINE HCL 20 MG/ML IJ SOLN
10.0000 mg | INTRAMUSCULAR | Status: DC | PRN
  Filled 2018-01-04: qty 1

## 2018-01-04 MED ORDER — METOPROLOL TARTRATE 50 MG PO TABS
50.0000 mg | ORAL_TABLET | Freq: Two times a day (BID) | ORAL | Status: DC
  Administered 2018-01-04 – 2018-01-07 (×7): 50 mg via ORAL
  Filled 2018-01-04 (×7): qty 1

## 2018-01-04 MED ORDER — ASPIRIN 81 MG PO CHEW
162.0000 mg | CHEWABLE_TABLET | Freq: Every day | ORAL | Status: DC
  Administered 2018-01-04 – 2018-01-07 (×4): 162 mg via ORAL
  Filled 2018-01-04 (×4): qty 2

## 2018-01-04 MED ORDER — METOCLOPRAMIDE HCL 5 MG/ML IJ SOLN
10.0000 mg | Freq: Once | INTRAMUSCULAR | Status: AC
  Administered 2018-01-04: 10 mg via INTRAVENOUS
  Filled 2018-01-04: qty 2

## 2018-01-04 MED ORDER — SENNOSIDES-DOCUSATE SODIUM 8.6-50 MG PO TABS
2.0000 | ORAL_TABLET | Freq: Two times a day (BID) | ORAL | Status: DC
  Administered 2018-01-04 – 2018-01-07 (×6): 2 via ORAL
  Filled 2018-01-04 (×7): qty 2

## 2018-01-04 MED ORDER — MORPHINE SULFATE (PF) 2 MG/ML IV SOLN: 1.0000 mg | INTRAVENOUS | Status: DC | PRN

## 2018-01-04 MED ORDER — FENTANYL CITRATE (PF) 100 MCG/2ML IJ SOLN
50.0000 ug | Freq: Once | INTRAMUSCULAR | Status: AC
  Administered 2018-01-04: 50 ug via INTRAVENOUS
  Filled 2018-01-04: qty 2

## 2018-01-04 MED ORDER — IPRATROPIUM-ALBUTEROL 0.5-2.5 (3) MG/3ML IN SOLN
3.0000 mL | Freq: Three times a day (TID) | RESPIRATORY_TRACT | Status: DC
  Administered 2018-01-05 – 2018-01-07 (×7): 3 mL via RESPIRATORY_TRACT
  Filled 2018-01-04 (×7): qty 3

## 2018-01-04 MED ORDER — PRO-STAT SUGAR FREE PO LIQD
30.0000 mL | Freq: Two times a day (BID) | ORAL | Status: DC
  Administered 2018-01-04 – 2018-01-07 (×4): 30 mL via ORAL
  Filled 2018-01-04 (×5): qty 30

## 2018-01-04 MED ORDER — ACETAMINOPHEN 325 MG PO TABS: 650.0000 mg | ORAL_TABLET | ORAL | Status: DC | PRN

## 2018-01-04 MED ORDER — METRONIDAZOLE IN NACL 5-0.79 MG/ML-% IV SOLN
500.0000 mg | Freq: Three times a day (TID) | INTRAVENOUS | Status: DC
  Administered 2018-01-04 – 2018-01-07 (×9): 500 mg via INTRAVENOUS
  Filled 2018-01-04 (×9): qty 100

## 2018-01-04 MED ORDER — DIPHENHYDRAMINE HCL 50 MG/ML IJ SOLN
25.0000 mg | Freq: Once | INTRAMUSCULAR | Status: AC
  Administered 2018-01-04: 25 mg via INTRAVENOUS
  Filled 2018-01-04: qty 1

## 2018-01-04 MED ORDER — IPRATROPIUM-ALBUTEROL 0.5-2.5 (3) MG/3ML IN SOLN: 3.0000 mL | RESPIRATORY_TRACT | Status: DC | PRN

## 2018-01-04 MED ORDER — TRAMADOL HCL 50 MG PO TABS
50.0000 mg | ORAL_TABLET | Freq: Four times a day (QID) | ORAL | Status: DC | PRN
  Administered 2018-01-04 – 2018-01-07 (×9): 50 mg via ORAL
  Filled 2018-01-04 (×10): qty 1

## 2018-01-04 MED ORDER — PANTOPRAZOLE SODIUM 40 MG PO TBEC
80.0000 mg | DELAYED_RELEASE_TABLET | Freq: Every day | ORAL | Status: DC
  Administered 2018-01-04 – 2018-01-07 (×4): 80 mg via ORAL
  Filled 2018-01-04 (×4): qty 2

## 2018-01-04 MED ORDER — MOMETASONE FURO-FORMOTEROL FUM 200-5 MCG/ACT IN AERO
2.0000 | INHALATION_SPRAY | Freq: Two times a day (BID) | RESPIRATORY_TRACT | Status: DC
  Administered 2018-01-04 – 2018-01-07 (×6): 2 via RESPIRATORY_TRACT
  Filled 2018-01-04: qty 8.8

## 2018-01-04 MED ORDER — IPRATROPIUM-ALBUTEROL 0.5-2.5 (3) MG/3ML IN SOLN
6.0000 mL | Freq: Once | RESPIRATORY_TRACT | Status: AC
  Administered 2018-01-04: 6 mL via RESPIRATORY_TRACT
  Filled 2018-01-04: qty 3

## 2018-01-04 MED ORDER — MORPHINE SULFATE (PF) 2 MG/ML IV SOLN
2.0000 mg | INTRAVENOUS | Status: DC | PRN
  Administered 2018-01-04: 2 mg via INTRAVENOUS
  Filled 2018-01-04: qty 1

## 2018-01-04 MED ORDER — ENSURE ENLIVE PO LIQD
237.0000 mL | Freq: Two times a day (BID) | ORAL | Status: DC
  Administered 2018-01-05 – 2018-01-06 (×4): 237 mL via ORAL

## 2018-01-04 MED ORDER — ISOSORBIDE MONONITRATE ER 60 MG PO TB24
30.0000 mg | ORAL_TABLET | Freq: Every day | ORAL | Status: DC
  Administered 2018-01-04 – 2018-01-07 (×4): 30 mg via ORAL
  Filled 2018-01-04 (×4): qty 1

## 2018-01-04 MED ORDER — ENOXAPARIN SODIUM 40 MG/0.4ML ~~LOC~~ SOLN
40.0000 mg | SUBCUTANEOUS | Status: DC
  Administered 2018-01-04 – 2018-01-06 (×3): 40 mg via SUBCUTANEOUS
  Filled 2018-01-04 (×3): qty 0.4

## 2018-01-04 NOTE — ED Provider Notes (Signed)
[facility] EMERGENCY DEPARTMENT Provider Note   CSN: [id number] Arrival date & time: 01/04/18  0142  Time seen 02:00 AM   History   Chief Complaint Chief Complaint  Patient presents with  . Chest Pain    HPI Sabrina Curry is a 67 y.o. female.  HPI patient states she ate pancakes for breakfast on 418.  About 3 PM she tried to eat pizza and she could not eat it because she started having "chest pain".  She actually points to her epigastric area of her abdomen.  She has had nausea without vomiting or diarrhea.  She states the pain does radiate into her back.  It makes her feel short of breath but she denies coughing.  She states nothing she does makes it hurt worse, nothing she does makes it feel better.  She states she tried Gas-X without relief.  She states she has a burning fluid that goes into her throat.  She states she has had that before with reflux disease.  She states she is taking Protonix and she has noticed in the past that greasy foods make it worse.  Patient states her feet are always swollen however her home health nurse noted today her toes looked purple.  She states it is better after she walks.  Patient states she has a history of TIAs.  PCP Rosita Fire, MD   Past Medical History:  Diagnosis Date  . Anxiety   . Arthritis   . Asthma   . Chronic back pain   . COPD (chronic obstructive pulmonary disease) ([place])   . Edema of both legs    chronic  . GERD (gastroesophageal reflux disease)   . H/O dizziness   . H/O shortness of breath   . Headache(784.0)   . Hx of migraines   . Hypertension   . Knee pain, bilateral   . Obstructive sleep apnea    On CPAP by nasal prong  . Palpitations 12/2012   ? SVT  . Seasonal allergies   . Stroke [facility])    last stoke Oct 2016  . Thoracic aortic aneurysm [facility]) 12/2012    12/2012: 4.3 cm fusiform aneurysm-ascending thoracic aorta  . Varicose veins of both legs with edema     Patient Active Problem List   Diagnosis Date  Noted  . Intractable epigastric abdominal pain 01/04/2018  . COPD with acute exacerbation ([place]) 11/20/2017  . Morbid obesity with BMI of 50.0-59.9, adult ([place]) 11/20/2017  . Chest pain at rest 11/19/2017  . Varicose veins of bilateral lower extremities with other complications 46/27/0350  . Swelling of limb 10/10/2016  . Spider veins of both lower extremities 10/10/2016  . COPD exacerbation ([place]) 02/06/2016  . Acute respiratory failure with hypoxia ([place]) 02/06/2016  . Hypoxia 02/06/2016  . COPD (chronic obstructive pulmonary disease) ([place])   . Chronic back pain   . Chronic obstructive pulmonary disease ([place])   . Chest pain 06/24/2015  . TIA (transient ischemic attack) 06/24/2015  . UTI (lower urinary tract infection) 06/24/2015  . Difficulty speaking   . Slurred speech   . Skin eruption 01/30/2013  . Obstructive sleep apnea   . Thoracic aortic aneurysm ([place]) 12/17/2012  . Paroxysmal supraventricular tachycardia-nonsustained 12/08/2012  . Morbid obesity ([place]) 12/08/2012  . Asthma, chronic 12/08/2012  . Hypertension 12/08/2012    Past Surgical History:  Procedure Laterality Date  . ABDOMINAL HYSTERECTOMY    . APPENDECTOMY     with gallbladder removal  . BACK SURGERY  had staff infection of back  . CATARACT EXTRACTION W/PHACO  01/16/2012   Procedure: CATARACT EXTRACTION PHACO AND INTRAOCULAR LENS PLACEMENT (IOC);  Surgeon: Elta Guadeloupe T. Gershon Crane, MD;  Location: [facility];  Service: Ophthalmology;  Laterality: Right;  CDE 6.12  . CHOLECYSTECTOMY     [facility]  . HERNIA REPAIR  2009   inscional hernia repair x2-3  . UMBILICAL HERNIA REPAIR       OB History    Gravida      Para      Term      Preterm      AB      Living  3     SAB      TAB      Ectopic      Multiple      Live Births               Home Medications    Prior to Admission medications   Medication Sig Start Date End Date Taking? Authorizing Provider  albuterol (PROAIR HFA) 108 (90 Base)  MCG/ACT inhaler Inhale 1 puff into the lungs every 6 (six) hours as needed for wheezing or shortness of breath.    [provider]  amLODipine (NORVASC) 10 MG tablet Take 0.5 tablets (5 mg total) by mouth daily. 11/20/17   Dhungel, Flonnie Overman, MD  aspirin 81 MG tablet Take 2 tablets (162 mg total) by mouth daily. 06/25/15   Kathie Dike, MD  budesonide-formoterol (SYMBICORT) 160-4.5 MCG/ACT inhaler Inhale 2 puffs into the lungs daily.    [provider]  isosorbide mononitrate (IMDUR) 30 MG 24 hr tablet TAKE (1) TABLET BY MOUTH ONCE DAILY. 08/20/17   Herminio Commons, MD  lisinopril (PRINIVIL,ZESTRIL) 40 MG tablet Take 1 tablet (40 mg total) by mouth daily. Patient not taking: Reported on 11/26/2017 11/16/14   Herminio Commons, MD  metoprolol tartrate (LOPRESSOR) 100 MG tablet Take 0.5 tablets (50 mg total) by mouth 2 (two) times daily. 11/20/17   Dhungel, Flonnie Overman, MD  oxyCODONE-acetaminophen (PERCOCET/ROXICET) 5-325 MG tablet Take 1 tablet by mouth daily as needed for moderate pain or severe pain.    [provider]  OXYGEN Inhale into the lungs.    [provider]  pantoprazole (PROTONIX) 40 MG tablet Take 1 tablet (40 mg total) by mouth daily. 06/25/15   Kathie Dike, MD  potassium chloride (K-DUR) 10 MEQ tablet TAKE ONE TABLET BY MOUTH TWICE DAILY. 08/20/17   Herminio Commons, MD  promethazine (PHENERGAN) 25 MG tablet Take 25 mg by mouth every 6 (six) hours as needed for nausea or vomiting.    [provider]  vitamin B-12 (CYANOCOBALAMIN) 1000 MCG tablet Take 1 tablet (1,000 mcg total) by mouth daily. 06/25/15   Kathie Dike, MD    Family History Family History  Problem Relation Age of Onset  . Hypertension Mother   . Diabetes Mother   . Hyperlipidemia Father        Also hypertension and an aneurysm  . Anesthesia problems Neg Hx   . Hypotension Neg Hx   . Pseudochol deficiency Neg Hx   . Malignant hyperthermia Neg Hx     Social  History Social History   Tobacco Use  . Smoking status: Never Smoker  . Smokeless tobacco: Never Used  Substance Use Topics  . Alcohol use: No    Alcohol/week: 0.0 oz  . Drug use: No  lives at home Lives with spouse   Allergies   Vancomycin cross reactors  Review of Systems Review of Systems  All other systems reviewed and are negative.    Physical Exam Updated Vital Signs BP (!) 168/98   Pulse 82   Temp 98 F (36.7 C) (Oral)   Resp (!) 25   Wt 126.6 kg (279 lb)   SpO2 96%   BMI 54.49 kg/m   Vital signs normal except hypertension   Physical Exam  Constitutional: She is oriented to person, place, and time. She appears well-developed and well-nourished.  Non-toxic appearance. She does not appear ill. No distress.  Patient has a slow mumbling speech pattern is hard to understand  HENT:  Head: Normocephalic and atraumatic.  Right Ear: External ear normal.  Left Ear: External ear normal.  Nose: Nose normal. No mucosal edema or rhinorrhea.  Mouth/Throat: Oropharynx is clear and moist and mucous membranes are normal. No dental abscesses or uvula swelling.  Eyes: Pupils are equal, round, and reactive to light. Conjunctivae and EOM are normal.  Neck: Normal range of motion and full passive range of motion without pain. Neck supple.  Cardiovascular: Normal rate, regular rhythm and normal heart sounds. Exam reveals no gallop and no friction rub.  No murmur heard. Pulmonary/Chest: Effort normal and breath sounds normal. No respiratory distress. She has no wheezes. She has no rhonchi. She has no rales. She exhibits no tenderness and no crepitus.  Abdominal: Soft. Normal appearance and bowel sounds are normal. She exhibits no distension. There is tenderness in the epigastric area. There is no rebound and no guarding.    Patient's pain is in the upper epigastric area of her abdomen, she is not tender over the sternum.  Area noted just above the umbilicus where she has some  soft swelling with known hernia.  Although it is mildly tender to palpation it is not the area where she is hurting.  Musculoskeletal: Normal range of motion. She exhibits edema. She exhibits no tenderness.  Moves all extremities well.  Patient is noted to have some diffuse swelling of her ankles and lower legs, however the volar aspect of her toes are purplish with good capillary refill.  She has good distal pulses.  Her toes feel cool to touch.  Neurological: She is alert and oriented to person, place, and time. She has normal strength. No cranial nerve deficit.  Skin: Skin is warm, dry and intact. No rash noted. No erythema. No pallor.  Psychiatric: She has a normal mood and affect. Her speech is normal and behavior is normal. Her mood appears not anxious.  Nursing note and vitals reviewed.    ED Treatments / Results  Labs (all labs ordered are listed, but only abnormal results are displayed) Results for orders placed or performed during the hospital encounter of 01/04/18  CBC  Result Value Ref Range   WBC 10.0 4.0 - 10.5 K/uL   RBC 4.54 3.87 - 5.11 MIL/uL   Hemoglobin 13.3 12.0 - 15.0 g/dL   HCT 42.7 36.0 - 46.0 %   MCV 94.1 78.0 - 100.0 fL   MCH 29.3 26.0 - 34.0 pg   MCHC 31.1 30.0 - 36.0 g/dL   RDW 13.9 11.5 - 15.5 %   Platelets 215 150 - 400 K/uL  Comprehensive metabolic panel  Result Value Ref Range   Sodium 140 135 - 145 mmol/L   Potassium 3.7 3.5 - 5.1 mmol/L   Chloride 96 (L) 101 - 111 mmol/L   CO2 29 22 - 32 mmol/L   Glucose, Bld 142 (H) 65 -  99 mg/dL   BUN 17 6 - 20 mg/dL   Creatinine, Ser 1.12 (H) 0.44 - 1.00 mg/dL   Calcium 10.3 8.9 - 10.3 mg/dL   Total Protein 8.3 (H) 6.5 - 8.1 g/dL   Albumin 4.0 3.5 - 5.0 g/dL   AST 16 15 - 41 U/L   ALT 5 (L) 14 - 54 U/L   Alkaline Phosphatase 94 38 - 126 U/L   Total Bilirubin 1.0 0.3 - 1.2 mg/dL   GFR calc non Af Amer 51 (L) >60 mL/min   GFR calc Af Amer 59 (L) >60 mL/min   Anion gap 15 5 - 15  Lipase, blood  Result  Value Ref Range   Lipase 31 11 - 51 U/L  Brain natriuretic peptide  Result Value Ref Range   B Natriuretic Peptide 171.0 (H) 0.0 - 100.0 pg/mL  I-stat troponin, ED  Result Value Ref Range   Troponin i, poc 0.00 0.00 - 0.08 ng/mL   Comment 3          I-stat troponin, ED  Result Value Ref Range   Troponin i, poc 0.00 0.00 - 0.08 ng/mL   Comment 3           Laboratory interpretation all normal except stable renal insufficiency, leukocytosis from vomiting, normal delta troponin, insignificant elevation of BNP    EKG EKG Interpretation  Date/Time:  Friday January 04 2018 01:51:26 EDT Ventricular Rate:  78 PR Interval:    QRS Duration: 89 QT Interval:  375 QTC Calculation: 428 R Axis:   47 Text Interpretation:  Sinus rhythm Low voltage, precordial leads Anteroseptal infarct, old Baseline wander  No significant change since last tracing 20 Nov 2017 Confirmed by Rolland Porter [phone]) on 01/04/2018 1:55:04 AM   Radiology Ct Abdomen Pelvis Wo Contrast  Result Date: 01/04/2018 CLINICAL DATA:  Abdominal distention and vomiting. Bilateral leg swelling and purple toes. EXAM: CT ABDOMEN AND PELVIS WITHOUT CONTRAST TECHNIQUE: Multidetector CT imaging of the abdomen and pelvis was performed following the standard protocol without IV contrast. COMPARISON:  06/23/2015 FINDINGS: Lower chest: Top-normal size heart. Aneurysmal dilatation of the ascending thoracic aorta to 4.6 cm. Dilatation of the main pulmonary artery to 4.1 cm consistent with chronic pulmonary hypertension. There is atelectasis in the right middle lobe and both lower lobes. No effusion or pneumothorax. Hepatobiliary: Status post cholecystectomy. The unenhanced liver is unremarkable. No biliary dilatation. Pancreas: Fatty atrophy of the pancreas. Spleen: Normal size spleen without mass. Adrenals/Urinary Tract: Adrenal glands are unremarkable. Kidneys are normal, without renal calculi, focal lesion, or hydronephrosis. Bladder is unremarkable.  Stomach/Bowel: Physiologic distention of the stomach. Mild fluid-filled distention of small bowel loops question enteritis. No mechanical source of bowel obstruction. Small bowel anastomotic sutures appear intact without bowel obstruction. Status post appendectomy. Vascular/Lymphatic: Mild aortoiliac atherosclerosis without aneurysm. No adenopathy. Reproductive: Status post hysterectomy.  No adnexal mass. Other: The patient has undergone prior incisional and umbilical hernia repair. There is herniation of omental fat, a short segment of transverse colon as well as small bowel without incarceration along the cephalad margin of a ventral mesh repair, series 6/70. This hernia measures approximately 6.2 cm craniocaudad by 7.1 cm transverse. Musculoskeletal: Lower lumbar degenerative disc disease. No acute nor suspicious osseous abnormalities. IMPRESSION: 1. Supraumbilical ventral hernia abutting the upper margin of a ventral mesh repair. This measures approximately 6.2 x 7.1 cm in craniocaudad by transverse dimension and contains omental fat, short segment of transverse colon and small intestine. No incarceration or bowel obstruction  is seen. 2. Mild fluid-filled distention of jejunum without definite mechanical bowel obstruction noted. Findings may represent a small bowel enteritis. Intact small bowel anastomosis without obstruction. 3. 4.6 cm ascending thoracic aortic aneurysm from 4.4 cm previously. Ascending thoracic aortic aneurysm. Recommend semi-annual imaging followup by CTA or MRA and referral to cardiothoracic surgery if not already obtained. This recommendation follows 2010 ACCF/AHA/AATS/ACR/ASA/SCA/SCAI/SIR/STS/SVM Guidelines for the Diagnosis and Management of Patients With Thoracic Aortic Disease. Circulation. 2010; 121: H062-B762 4. Lower lumbar spondylosis. 5. Fatty atrophy of the pancreas. Electronically Signed   By: Ashley Royalty M.D.   On: 01/04/2018 03:02   Dg Chest 2 View  Result Date:  01/04/2018 CLINICAL DATA:  Chest pain since 3 p.m. yesterday.  Dyspnea. EXAM: CHEST - 2 VIEW COMPARISON:  11/19/2017 FINDINGS: Stable mild cardiomegaly with slight uncoiling of the thoracic aorta. No acute pulmonary consolidation or CHF. No effusion. Linear density along the right mid lung may represent the minor fissure versus scarring. IMPRESSION: No active cardiopulmonary disease. Electronically Signed   By: Ashley Royalty M.D.   On: 01/04/2018 02:48    Procedures .Critical Care Performed by: Rolland Porter, MD Authorized by: Rolland Porter, MD   Critical care provider statement:    Critical care time (minutes):  40   Critical care was necessary to treat or prevent imminent or life-threatening deterioration of the following conditions:  Circulatory failure   Critical care was time spent personally by me on the following activities:  Discussions with consultants, examination of patient, obtaining history from patient or surrogate, ordering and review of laboratory studies, ordering and review of radiographic studies, pulse oximetry and re-evaluation of patient's condition   (including critical care time)  Medications Ordered in ED Medications  ondansetron (ZOFRAN) injection 4 mg (4 mg Intravenous Given 01/04/18 0253)  famotidine (PEPCID) IVPB 20 mg premix (0 mg Intravenous Stopped 01/04/18 0345)  fentaNYL (SUBLIMAZE) injection 50 mcg (50 mcg Intravenous Given 01/04/18 0252)  ondansetron (ZOFRAN) injection 4 mg (4 mg Intravenous Given 01/04/18 0350)  gi cocktail (Maalox,Lidocaine,Donnatal) (30 mLs Oral Given 01/04/18 0428)  fentaNYL (SUBLIMAZE) injection 50 mcg (50 mcg Intravenous Given 01/04/18 0524)  diphenhydrAMINE (BENADRYL) injection 25 mg (25 mg Intravenous Given 01/04/18 0607)  metoCLOPramide (REGLAN) injection 10 mg (10 mg Intravenous Given 01/04/18 0606)     Initial Impression / Assessment and Plan / ED Course  I have reviewed the triage vital signs and the nursing notes.  Pertinent labs &  imaging results that were available during my care of the patient were reviewed by me and considered in my medical decision making (see chart for details).    Patient was given IV Pepcid for possible GERD.  She was given IV Zofran for nausea and IV pain medication.  Chest x-ray and CT of the abdomen pelvis was done to look for AAA.  With her purplish discoloration of her toes concern was for micro emboli from a AAA going to peripherally.  She also has a history of a thoracic aneurysm.  However she is not having pain in her chest.  Patient continues to complain of nausea, she was given Zofran again.  Patient continued to complain of chest pain and was given a GI cocktail.  Patient continues to complain of pain, she states "nothing has helped".  She was given a repeat dose of fentanyl.  We discussed her CT results.  She is also had multiple doses of nausea medications without relief of her nausea.  06:28 AM Dr Myna Hidalgo, hospitalist, will admit  Review of the [place] shows patient gets #55 oxycodone 7.5/325 monthly from Dr. Freddie Apley office.  They were last filled December 13, 2017.  Final Clinical Impressions(s) / ED Diagnoses   Final diagnoses:  Epigastric abdominal pain  Nausea    Plan admission  Rolland Porter, MD, Barbette Or, MD 01/04/18 [phone]

## 2018-01-04 NOTE — H&P (Addendum)
History and Physical  Sabrina Curry QIH:[id number] DOB: 08-May-1951 DOA: 01/04/2018  Referring physician:  PCP: Rosita Fire, MD   Chief Complaint: chest pain  HPI: Sabrina Curry is a 67 y.o. female with multiple medical problems detailed below presented to ED complaining of chest pain and epigastric pain.  Her past medical history as detailed below.  She reported that she began to have symptoms after eating pancakes for breakfast and she also had pizza later in the day.  Her symptoms persisted and worsened.  She has had some nausea but no vomiting or diarrhea.  She is having some epigastric pain as well.  Nothing seems to make the symptoms worse or better.  She has tried over-the-counter medications with no significant relief.  She has a lot of burning in the throat.  She says that she does take her Protonix daily.  She says that her her acid reflux symptoms do worsen with fatty foods.  ED course: The patient was evaluated in the ED and noted to have an initial troponin that was in the normal range.  Her chest x-ray and urinalysis was unremarkable.  She did have an abnormal CT of the abdomen that was suspicious for small bowel enteritis.  She is being admitted for further evaluation and management.  Review of Systems: All systems reviewed and apart from history of presenting illness, are negative.  Past Medical History:  Diagnosis Date  . Anxiety   . Arthritis   . Asthma   . Chronic back pain   . COPD (chronic obstructive pulmonary disease) ([place])   . Edema of both legs    chronic  . GERD (gastroesophageal reflux disease)   . H/O dizziness   . H/O shortness of breath   . Headache(784.0)   . Hx of migraines   . Hypertension   . Knee pain, bilateral   . Obstructive sleep apnea    On CPAP by nasal prong  . Palpitations 12/2012   ? SVT  . Seasonal allergies   . Stroke [facility])    last stoke Oct 2016  . Thoracic aortic aneurysm [facility]) 12/2012    12/2012: 4.3 cm fusiform  aneurysm-ascending thoracic aorta  . Varicose veins of both legs with edema    Past Surgical History:  Procedure Laterality Date  . ABDOMINAL HYSTERECTOMY    . APPENDECTOMY     with gallbladder removal  . BACK SURGERY     had staff infection of back  . CATARACT EXTRACTION W/PHACO  01/16/2012   Procedure: CATARACT EXTRACTION PHACO AND INTRAOCULAR LENS PLACEMENT (IOC);  Surgeon: Elta Guadeloupe T. Gershon Crane, MD;  Location: [facility];  Service: Ophthalmology;  Laterality: Right;  CDE 6.12  . CHOLECYSTECTOMY     [facility]  . HERNIA REPAIR  2009   inscional hernia repair x2-3  . UMBILICAL HERNIA REPAIR     Social History:  reports that she has never smoked. She has never used smokeless tobacco. She reports that she does not drink alcohol or use drugs.  Allergies  Allergen Reactions  . Vancomycin Cross Reactors Anaphylaxis    Family History  Problem Relation Age of Onset  . Hypertension Mother   . Diabetes Mother   . Hyperlipidemia Father        Also hypertension and an aneurysm  . Anesthesia problems Neg Hx   . Hypotension Neg Hx   . Pseudochol deficiency Neg Hx   . Malignant hyperthermia Neg Hx     Prior to Admission medications   Medication  Sig Start Date End Date Taking? Authorizing Provider  albuterol (PROAIR HFA) 108 (90 Base) MCG/ACT inhaler Inhale 1 puff into the lungs every 6 (six) hours as needed for wheezing or shortness of breath.    [provider]  amLODipine (NORVASC) 10 MG tablet Take 0.5 tablets (5 mg total) by mouth daily. 11/20/17   Dhungel, Flonnie Overman, MD  aspirin 81 MG tablet Take 2 tablets (162 mg total) by mouth daily. 06/25/15   Kathie Dike, MD  budesonide-formoterol (SYMBICORT) 160-4.5 MCG/ACT inhaler Inhale 2 puffs into the lungs daily.    [provider]  isosorbide mononitrate (IMDUR) 30 MG 24 hr tablet TAKE (1) TABLET BY MOUTH ONCE DAILY. 08/20/17   Herminio Commons, MD  lisinopril (PRINIVIL,ZESTRIL) 40 MG tablet Take 1 tablet (40 mg total) by  mouth daily. Patient not taking: Reported on 11/26/2017 11/16/14   Herminio Commons, MD  metoprolol tartrate (LOPRESSOR) 100 MG tablet Take 0.5 tablets (50 mg total) by mouth 2 (two) times daily. 11/20/17   Dhungel, Flonnie Overman, MD  oxyCODONE-acetaminophen (PERCOCET/ROXICET) 5-325 MG tablet Take 1 tablet by mouth daily as needed for moderate pain or severe pain.    [provider]  OXYGEN Inhale into the lungs.    [provider]  pantoprazole (PROTONIX) 40 MG tablet Take 1 tablet (40 mg total) by mouth daily. 06/25/15   Kathie Dike, MD  potassium chloride (K-DUR) 10 MEQ tablet TAKE ONE TABLET BY MOUTH TWICE DAILY. 08/20/17   Herminio Commons, MD  promethazine (PHENERGAN) 25 MG tablet Take 25 mg by mouth every 6 (six) hours as needed for nausea or vomiting.    [provider]  vitamin B-12 (CYANOCOBALAMIN) 1000 MCG tablet Take 1 tablet (1,000 mcg total) by mouth daily. 06/25/15   Kathie Dike, MD   Physical Exam: Vitals:   01/04/18 0430 01/04/18 0500 01/04/18 0600 01/04/18 0630  BP: (!) 173/82 (!) 178/109 (!) 154/92 (!) 167/103  Pulse: 91 96    Resp: 20 (!) 22 20 19   Temp:      TempSrc:      SpO2: 95% 94%    Weight:         General exam: Moderately built and nourished patient, lying comfortably supine on the gurney in no obvious distress.  She is cooperative.  She does hold her abdomen in the epigastric area and also under the right lower quadrant times.  Head, eyes and ENT: Nontraumatic and normocephalic. Pupils equally reacting to light and accommodation. Oral mucosa moist.  Neck: Supple. No JVD, carotid bruit or thyromegaly.  Lymphatics: No lymphadenopathy.  Respiratory system: She has diffuse wheezing noted in the posterior left lung field.  No increased work of breathing.  Cardiovascular system: S1 and S2 heard, RRR.   Gastrointestinal system: Abdomen is nondistended, soft and mild epigastric tenderness noted and some mild right lower quadrant  tenderness but no guarding. Normal bowel sounds heard. No organomegaly or masses appreciated.  Central nervous system: Alert and oriented. No focal neurological deficits.  Extremities: Symmetric 5 x 5 power. Peripheral pulses symmetrically felt.  1+-2+ nonpitting pedal edema and pretibial edema bilateral lower extremities.  Skin: No rashes or acute findings.  Musculoskeletal system: Negative exam.  Psychiatry: Pleasant and cooperative.  Labs on Admission:  Basic Metabolic Panel: Recent Labs  Lab 01/04/18 0207  NA 140  K 3.7  CL 96*  CO2 29  GLUCOSE 142*  BUN 17  CREATININE 1.12*  CALCIUM 10.3   Liver Function Tests: Recent Labs  Lab 01/04/18 0207  AST 16  ALT 5*  ALKPHOS 94  BILITOT 1.0  PROT 8.3*  ALBUMIN 4.0   Recent Labs  Lab 01/04/18 0207  LIPASE 31   No results for input(s): AMMONIA in the last 168 hours. CBC: Recent Labs  Lab 01/04/18 0207  WBC 10.0  HGB 13.3  HCT 42.7  MCV 94.1  PLT 215   Cardiac Enzymes: No results for input(s): CKTOTAL, CKMB, CKMBINDEX, TROPONINI in the last 168 hours.  BNP (last 3 results) No results for input(s): PROBNP in the last 8760 hours. CBG: No results for input(s): GLUCAP in the last 168 hours.  Radiological Exams on Admission: Ct Abdomen Pelvis Wo Contrast  Result Date: 01/04/2018 CLINICAL DATA:  Abdominal distention and vomiting. Bilateral leg swelling and purple toes. EXAM: CT ABDOMEN AND PELVIS WITHOUT CONTRAST TECHNIQUE: Multidetector CT imaging of the abdomen and pelvis was performed following the standard protocol without IV contrast. COMPARISON:  06/23/2015 FINDINGS: Lower chest: Top-normal size heart. Aneurysmal dilatation of the ascending thoracic aorta to 4.6 cm. Dilatation of the main pulmonary artery to 4.1 cm consistent with chronic pulmonary hypertension. There is atelectasis in the right middle lobe and both lower lobes. No effusion or pneumothorax. Hepatobiliary: Status post cholecystectomy. The  unenhanced liver is unremarkable. No biliary dilatation. Pancreas: Fatty atrophy of the pancreas. Spleen: Normal size spleen without mass. Adrenals/Urinary Tract: Adrenal glands are unremarkable. Kidneys are normal, without renal calculi, focal lesion, or hydronephrosis. Bladder is unremarkable. Stomach/Bowel: Physiologic distention of the stomach. Mild fluid-filled distention of small bowel loops question enteritis. No mechanical source of bowel obstruction. Small bowel anastomotic sutures appear intact without bowel obstruction. Status post appendectomy. Vascular/Lymphatic: Mild aortoiliac atherosclerosis without aneurysm. No adenopathy. Reproductive: Status post hysterectomy.  No adnexal mass. Other: The patient has undergone prior incisional and umbilical hernia repair. There is herniation of omental fat, a short segment of transverse colon as well as small bowel without incarceration along the cephalad margin of a ventral mesh repair, series 6/70. This hernia measures approximately 6.2 cm craniocaudad by 7.1 cm transverse. Musculoskeletal: Lower lumbar degenerative disc disease. No acute nor suspicious osseous abnormalities. IMPRESSION: 1. Supraumbilical ventral hernia abutting the upper margin of a ventral mesh repair. This measures approximately 6.2 x 7.1 cm in craniocaudad by transverse dimension and contains omental fat, short segment of transverse colon and small intestine. No incarceration or bowel obstruction is seen. 2. Mild fluid-filled distention of jejunum without definite mechanical bowel obstruction noted. Findings may represent a small bowel enteritis. Intact small bowel anastomosis without obstruction. 3. 4.6 cm ascending thoracic aortic aneurysm from 4.4 cm previously. Ascending thoracic aortic aneurysm. Recommend semi-annual imaging followup by CTA or MRA and referral to cardiothoracic surgery if not already obtained. This recommendation follows 2010 ACCF/AHA/AATS/ACR/ASA/SCA/SCAI/SIR/STS/SVM  Guidelines for the Diagnosis and Management of Patients With Thoracic Aortic Disease. Circulation. 2010; 121: W979-G921 4. Lower lumbar spondylosis. 5. Fatty atrophy of the pancreas. Electronically Signed   By: Ashley Royalty M.D.   On: 01/04/2018 03:02   Dg Chest 2 View  Result Date: 01/04/2018 CLINICAL DATA:  Chest pain since 3 p.m. yesterday.  Dyspnea. EXAM: CHEST - 2 VIEW COMPARISON:  11/19/2017 FINDINGS: Stable mild cardiomegaly with slight uncoiling of the thoracic aorta. No acute pulmonary consolidation or CHF. No effusion. Linear density along the right mid lung may represent the minor fissure versus scarring. IMPRESSION: No active cardiopulmonary disease. Electronically Signed   By: Ashley Royalty M.D.   On: 01/04/2018 02:48   EKG: Independently  reviewed.  Normal sinus rhythm, no acute ST-T wave abnormalities noted.  Assessment/Plan Principal Problem:   Intractable epigastric abdominal pain Active Problems:   Hypertension   Thoracic aortic aneurysm (H[facility])   COPD (chronic obstructive pulmonary disease) (H[facility])   Chronic back pain   Chest pain at rest  1. Epigastric abdominal pain-suspect she has some small bowel enteritis we will treat with Cipro and Flagyl IV.  Follow clinically.  Repeat CBC in the morning.  Continue nausea medications as needed. 2. Atypical chest pain- we will cycle troponin to rule out acute ischemia.  Monitor on telemetry. 3. COPD with mild acute exacerbation-treating with nebulizers and steroids.  Follow clinically.  Continue supportive care. 4. Essential hypertension-resume home medications and monitor. 5. Thoracic aortic aneurysm- currently stable, monitor clinically, will need outpatient follow-up for surveillance.  DVT Prophylaxis: Lovenox Code Status: DNR Family Communication: Patient Disposition Plan: Ongoing medical treatment on telemetry  Time spent: 62 minutes  Irwin Brakeman, MD [facility] Pager [phone]  If 7PM-7AM, please contact  night-coverage [email or web address] Password [facility] 01/04/2018, 7:13 AM

## 2018-01-04 NOTE — Progress Notes (Signed)
Initial Nutrition Assessment  DOCUMENTATION CODES:  Morbid obesity  INTERVENTION:  Will order 30 mL Prostat BID, each supplement provides 100 kcal and 15 grams of protein.  NUTRITION DIAGNOSIS:  Inadequate oral intake related to nausea, acute illness(Small bowel enteritis) as evidenced by per patient/family report.  GOAL:  Patient will meet greater than or equal to 90% of their needs  MONITOR:  PO intake, Labs, Supplement acceptance, Weight trends, I & O's  REASON FOR ASSESSMENT:  Malnutrition Screening Tool    ASSESSMENT:  67 y/o female Pmhx Anxiety, CVA, OSA, Morbid obesity, HTN, GERD, COPD.  Presents with chest and epigastric pain x 1 day, developing after eating pancakes and pizza. Has had associated nausea and "burning of throat". Work up showed potential small bowel enteritis and pt admitted for evaluation/management.   Patient reports poor intake since yesterdays breakfast, when she had some pancakes. Shortly after, she developed nausea and lost her appetite, though apparently she had pizza later in the day. No V/C/D  At baseline, she does not follow a therapeutic diet per se, but does "love vegetables". She does not take any vitamins or minerals.   She does not know what her UBW is, though notes it "goes up and down"Per chart, the patient continues to gradually gain weight. Roughly her weight trends are as follows. Was in 220-230s in 2014-2015, . 240s-250s in 2016-2017, 260's in 2018 and now she is now 280 lbs.   At this time, she still has a poorer appetite. She is agreeable to supplements. Given her morbidly obese status, will order Prostat to help meet protein needs w/o excess calories.   Physical Exam: WDL. Morbidly obese  Labs: Creat: 1.12. Glu: 142, BNP:171 Meds: methylprednisolone, PPI, Senokot S, IV abx, IV h2ra  Recent Labs  Lab 01/04/18 0207  NA 140  K 3.7  CL 96*  CO2 29  BUN 17  CREATININE 1.12*  CALCIUM 10.3  GLUCOSE 142*   NUTRITION - FOCUSED  PHYSICAL EXAM: WDL  Diet Order:  Diet Heart Room service appropriate? Yes; Fluid consistency: Thin  EDUCATION NEEDS:  No education needs have been identified at this time  Skin:  Skin Assessment: Reviewed RN Assessment  Last BM:  4/18  Height:  Ht Readings from Last 1 Encounters:  01/04/18 4' 10"  (1.473 m)   Weight:  Wt Readings from Last 1 Encounters:  01/04/18 281 lb 1.4 oz (127.5 kg)   Wt Readings from Last 10 Encounters:  01/04/18 281 lb 1.4 oz (127.5 kg)  11/26/17 279 lb (126.6 kg)  11/19/17 270 lb (122.5 kg)  05/22/17 269 lb (122 kg)  11/21/16 260 lb 4.8 oz (118.1 kg)  10/10/16 265 lb (120.2 kg)  09/01/16 268 lb (121.6 kg)  06/16/16 250 lb (113.4 kg)  03/15/16 263 lb (119.3 kg)  02/22/16 253 lb (114.8 kg)   Ideal Body Weight:  43.94 kg  BMI:  Body mass index is 58.75 kg/m.  Estimated Nutritional Needs:  Kcal:  1300-1550  kcals (10-12 kcal/kg bw) Protein:  65-80g Pro (1.5-1.8 g/kg ibw) Fluid:  >1.5 L fluid (1 ml/kcal)  Burtis Junes RD, LDN, CNSC Clinical Nutrition Available Tues-Sat via Pager: [phone] 01/04/2018 2:41 PM

## 2018-01-04 NOTE — ED Triage Notes (Signed)
Pt arrives via [facility]. Pt C/O chest pain that started around 1500 yesterday. Pt denies N/V.

## 2018-01-04 NOTE — ED Notes (Signed)
Pt states pain in her chest is unchanged 'intermittent spasm tightness' pain in epigastric area. BP is 174/102 with all other vitals WNL.

## 2018-01-05 ENCOUNTER — Observation Stay (HOSPITAL_COMMUNITY): Payer: Medicaid Other

## 2018-01-05 DIAGNOSIS — Z7951 Long term (current) use of inhaled steroids: Secondary | ICD-10-CM | POA: Diagnosis not present

## 2018-01-05 DIAGNOSIS — G8929 Other chronic pain: Secondary | ICD-10-CM | POA: Diagnosis present

## 2018-01-05 DIAGNOSIS — R110 Nausea: Secondary | ICD-10-CM | POA: Diagnosis not present

## 2018-01-05 DIAGNOSIS — Z7982 Long term (current) use of aspirin: Secondary | ICD-10-CM | POA: Diagnosis not present

## 2018-01-05 DIAGNOSIS — I712 Thoracic aortic aneurysm, without rupture: Secondary | ICD-10-CM

## 2018-01-05 DIAGNOSIS — J449 Chronic obstructive pulmonary disease, unspecified: Secondary | ICD-10-CM | POA: Diagnosis not present

## 2018-01-05 DIAGNOSIS — Z8673 Personal history of transient ischemic attack (TIA), and cerebral infarction without residual deficits: Secondary | ICD-10-CM | POA: Diagnosis not present

## 2018-01-05 DIAGNOSIS — Z66 Do not resuscitate: Secondary | ICD-10-CM | POA: Diagnosis present

## 2018-01-05 DIAGNOSIS — Z6841 Body Mass Index (BMI) 40.0 and over, adult: Secondary | ICD-10-CM | POA: Diagnosis not present

## 2018-01-05 DIAGNOSIS — K219 Gastro-esophageal reflux disease without esophagitis: Secondary | ICD-10-CM | POA: Diagnosis present

## 2018-01-05 DIAGNOSIS — K5000 Crohn's disease of small intestine without complications: Secondary | ICD-10-CM | POA: Diagnosis present

## 2018-01-05 DIAGNOSIS — R1013 Epigastric pain: Secondary | ICD-10-CM | POA: Diagnosis present

## 2018-01-05 DIAGNOSIS — Z79899 Other long term (current) drug therapy: Secondary | ICD-10-CM | POA: Diagnosis not present

## 2018-01-05 DIAGNOSIS — M549 Dorsalgia, unspecified: Secondary | ICD-10-CM | POA: Diagnosis present

## 2018-01-05 DIAGNOSIS — I10 Essential (primary) hypertension: Secondary | ICD-10-CM | POA: Diagnosis present

## 2018-01-05 DIAGNOSIS — G4733 Obstructive sleep apnea (adult) (pediatric): Secondary | ICD-10-CM | POA: Diagnosis present

## 2018-01-05 DIAGNOSIS — J441 Chronic obstructive pulmonary disease with (acute) exacerbation: Secondary | ICD-10-CM | POA: Diagnosis present

## 2018-01-05 DIAGNOSIS — K529 Noninfective gastroenteritis and colitis, unspecified: Secondary | ICD-10-CM | POA: Diagnosis present

## 2018-01-05 LAB — COMPREHENSIVE METABOLIC PANEL
ALT: 5 U/L — ABNORMAL LOW (ref 14–54)
AST: 16 U/L (ref 15–41)
Albumin: 3.4 g/dL — ABNORMAL LOW (ref 3.5–5.0)
Alkaline Phosphatase: 72 U/L (ref 38–126)
Anion gap: 13 (ref 5–15)
BILIRUBIN TOTAL: 0.7 mg/dL (ref 0.3–1.2)
BUN: 23 mg/dL — AB (ref 6–20)
CHLORIDE: 98 mmol/L — AB (ref 101–111)
CO2: 28 mmol/L (ref 22–32)
Calcium: 9.6 mg/dL (ref 8.9–10.3)
Creatinine, Ser: 1.11 mg/dL — ABNORMAL HIGH (ref 0.44–1.00)
GFR, EST AFRICAN AMERICAN: 59 mL/min — AB (ref >60)
GFR, EST NON AFRICAN AMERICAN: 51 mL/min — AB (ref >60)
Glucose, Bld: 155 mg/dL — ABNORMAL HIGH (ref 65–99)
POTASSIUM: 4 mmol/L (ref 3.5–5.1)
Sodium: 139 mmol/L (ref 135–145)
TOTAL PROTEIN: 7.1 g/dL (ref 6.5–8.1)

## 2018-01-05 LAB — MAGNESIUM: MAGNESIUM: 1.9 mg/dL (ref 1.7–2.4)

## 2018-01-05 MED ORDER — ZOLPIDEM TARTRATE 5 MG PO TABS
5.0000 mg | ORAL_TABLET | Freq: Every evening | ORAL | Status: AC | PRN
  Administered 2018-01-05: 5 mg via ORAL
  Filled 2018-01-05: qty 1

## 2018-01-05 NOTE — Progress Notes (Signed)
PROGRESS NOTE    Sabrina Curry  TIR:[id number]  DOB: [birth year]  DOA: 01/04/2018 PCP: Rosita Fire, MD   Brief Admission Hx: She reported that she began to have symptoms after eating pancakes for breakfast and she also had pizza later in the day.  Her symptoms persisted and worsened.  She has had some nausea but no vomiting or diarrhea.  She is having some epigastric pain as well.  Nothing seems to make the symptoms worse or better.  She has tried over-the-counter medications with no significant relief.  She has a lot of burning in the throat.  She says that she does take her Protonix daily.  She says that her her acid reflux symptoms do worsen with fatty foods.  She was admitted with COPD exacerbation, chest discomfort and small bowel enteritis.   MDM/Assessment & Plan:    1. Epigastric abdominal pain-suspect she has some small bowel enteritis seems to be improving with Cipro and Flagyl IV.  Follow clinically.  Repeat CBC in the morning.  Continue nausea medications as needed. 2. Atypical chest pain- serial troponin to rule out acute ischemia.  Monitor on telemetry. 3. COPD with mild acute exacerbation-treating with nebulizers and steroids.  Follow clinically.  Continue supportive care.  Slowly improving.  4. Essential hypertension-resume home medications and monitor. 5. Thoracic aortic aneurysm- currently stable, monitor clinically, will need outpatient follow-up for surveillance.  DVT Prophylaxis: Lovenox Code Status: DNR Family Communication: Patient Disposition Plan: Ongoing medical treatment on telemetry, home in 1-2 days  Subjective: Pt complains of persistent epigastric discomfort but overall is getting better.    Objective: Vitals:   01/04/18 2156 01/05/18 0624 01/05/18 0715 01/05/18 0724  BP: 111/72 (!) 144/86    Pulse: 81 64    Resp: 19 17    Temp: 98.4 F (36.9 C) 98.2 F (36.8 C)    TempSrc: Oral Oral    SpO2: 94% 94% 94% 99%  Weight:      Height:         Intake/Output Summary (Last 24 hours) at 01/05/2018 0955 Last data filed at 01/05/2018 0900 Gross per 24 hour  Intake 1020 ml  Output -  Net 1020 ml   Filed Weights   01/04/18 0145 01/04/18 0947  Weight: 126.6 kg (279 lb) 127.5 kg (281 lb 1.4 oz)     REVIEW OF SYSTEMS  As per history otherwise all reviewed and reported negative  Exam:  General exam: morbidly obese, awake,alert, NAD, cooperative.  Respiratory system: wheezing heard RLL. No increased work of breathing. Cardiovascular system: normal S1 & S2 heard, RRR. No JVD, murmurs, gallops, clicks or pedal edema. Gastrointestinal system: Abdomen is nondistended, soft and mild epigastric tenderness. Normal bowel sounds heard. No guarding or rebound tenderness.  Central nervous system: Alert and oriented. No focal neurological deficits. Extremities: no clubbing, warm extremities, and good pedal pulses.   Data Reviewed: Basic Metabolic Panel: Recent Labs  Lab 01/04/18 0207 01/05/18 0611  NA 140 139  K 3.7 4.0  CL 96* 98*  CO2 29 28  GLUCOSE 142* 155*  BUN 17 23*  CREATININE 1.12* 1.11*  CALCIUM 10.3 9.6  MG  --  1.9   Liver Function Tests: Recent Labs  Lab 01/04/18 0207 01/05/18 0611  AST 16 16  ALT 5* 5*  ALKPHOS 94 72  BILITOT 1.0 0.7  PROT 8.3* 7.1  ALBUMIN 4.0 3.4*   Recent Labs  Lab 01/04/18 0207  LIPASE 31   No results for input(s): AMMONIA in the  last 168 hours. CBC: Recent Labs  Lab 01/04/18 0207  WBC 10.0  HGB 13.3  HCT 42.7  MCV 94.1  PLT 215   Cardiac Enzymes: Recent Labs  Lab 01/04/18 1220  TROPONINI <0.03   CBG (last 3)  No results for input(s): GLUCAP in the last 72 hours. No results found for this or any previous visit (from the past 240 hour(s)).   Studies: Ct Abdomen Pelvis Wo Contrast  Result Date: 01/04/2018 CLINICAL DATA:  Abdominal distention and vomiting. Bilateral leg swelling and purple toes. EXAM: CT ABDOMEN AND PELVIS WITHOUT CONTRAST TECHNIQUE:  Multidetector CT imaging of the abdomen and pelvis was performed following the standard protocol without IV contrast. COMPARISON:  06/23/2015 FINDINGS: Lower chest: Top-normal size heart. Aneurysmal dilatation of the ascending thoracic aorta to 4.6 cm. Dilatation of the main pulmonary artery to 4.1 cm consistent with chronic pulmonary hypertension. There is atelectasis in the right middle lobe and both lower lobes. No effusion or pneumothorax. Hepatobiliary: Status post cholecystectomy. The unenhanced liver is unremarkable. No biliary dilatation. Pancreas: Fatty atrophy of the pancreas. Spleen: Normal size spleen without mass. Adrenals/Urinary Tract: Adrenal glands are unremarkable. Kidneys are normal, without renal calculi, focal lesion, or hydronephrosis. Bladder is unremarkable. Stomach/Bowel: Physiologic distention of the stomach. Mild fluid-filled distention of small bowel loops question enteritis. No mechanical source of bowel obstruction. Small bowel anastomotic sutures appear intact without bowel obstruction. Status post appendectomy. Vascular/Lymphatic: Mild aortoiliac atherosclerosis without aneurysm. No adenopathy. Reproductive: Status post hysterectomy.  No adnexal mass. Other: The patient has undergone prior incisional and umbilical hernia repair. There is herniation of omental fat, a short segment of transverse colon as well as small bowel without incarceration along the cephalad margin of a ventral mesh repair, series 6/70. This hernia measures approximately 6.2 cm craniocaudad by 7.1 cm transverse. Musculoskeletal: Lower lumbar degenerative disc disease. No acute nor suspicious osseous abnormalities. IMPRESSION: 1. Supraumbilical ventral hernia abutting the upper margin of a ventral mesh repair. This measures approximately 6.2 x 7.1 cm in craniocaudad by transverse dimension and contains omental fat, short segment of transverse colon and small intestine. No incarceration or bowel obstruction is seen.  2. Mild fluid-filled distention of jejunum without definite mechanical bowel obstruction noted. Findings may represent a small bowel enteritis. Intact small bowel anastomosis without obstruction. 3. 4.6 cm ascending thoracic aortic aneurysm from 4.4 cm previously. Ascending thoracic aortic aneurysm. Recommend semi-annual imaging followup by CTA or MRA and referral to cardiothoracic surgery if not already obtained. This recommendation follows 2010 ACCF/AHA/AATS/ACR/ASA/SCA/SCAI/SIR/STS/SVM Guidelines for the Diagnosis and Management of Patients With Thoracic Aortic Disease. Circulation. 2010; 121: E315-Q008 4. Lower lumbar spondylosis. 5. Fatty atrophy of the pancreas. Electronically Signed   By: Ashley Royalty M.D.   On: 01/04/2018 03:02   Dg Chest 2 View  Result Date: 01/04/2018 CLINICAL DATA:  Chest pain since 3 p.m. yesterday.  Dyspnea. EXAM: CHEST - 2 VIEW COMPARISON:  11/19/2017 FINDINGS: Stable mild cardiomegaly with slight uncoiling of the thoracic aorta. No acute pulmonary consolidation or CHF. No effusion. Linear density along the right mid lung may represent the minor fissure versus scarring. IMPRESSION: No active cardiopulmonary disease. Electronically Signed   By: Ashley Royalty M.D.   On: 01/04/2018 02:48   Dg Chest Port 1 View  Result Date: 01/05/2018 CLINICAL DATA:  Cough and shortness of breath. Patient admitted to the hospital for chest pain 01/04/2018. EXAM: PORTABLE CHEST 1 VIEW COMPARISON:  PA and lateral chest 01/04/2018 and 11/19/2017. FINDINGS: There is pulmonary vascular  congestion. No consolidative process, pneumothorax or effusion. Heart size is upper normal. IMPRESSION: Pulmonary vascular congestion.  No focal abnormality. Electronically Signed   By: Inge Rise M.D.   On: 01/05/2018 08:46   Scheduled Meds: . amLODipine  5 mg Oral Daily  . aspirin  162 mg Oral Daily  . enoxaparin (LOVENOX) injection  40 mg Subcutaneous Q24H  . feeding supplement (ENSURE ENLIVE)  237 mL Oral  BID BM  . feeding supplement (PRO-STAT SUGAR FREE 64)  30 mL Oral BID  . ipratropium-albuterol  3 mL Nebulization TID  . isosorbide mononitrate  30 mg Oral Daily  . methylPREDNISolone (SOLU-MEDROL) injection  60 mg Intravenous Q6H  . metoprolol tartrate  50 mg Oral BID  . mometasone-formoterol  2 puff Inhalation BID  . pantoprazole  80 mg Oral Daily  . senna-docusate  2 tablet Oral BID   Continuous Infusions: . ciprofloxacin Stopped (01/05/18 0130)  . metronidazole Stopped (01/05/18 1638)    Principal Problem:   Intractable epigastric abdominal pain Active Problems:   Hypertension   Thoracic aortic aneurysm ([facility])   COPD (chronic obstructive pulmonary disease) ([facility])   Chronic back pain   Chest pain at rest   Time spent:   Irwin Brakeman, MD, FAAFP [facility] Pager [phone]  If 7PM-7AM, please contact night-coverage [email or web address] Password [email or web address] 01/05/2018, 9:55 AM    LOS: 0 days

## 2018-01-06 DIAGNOSIS — R1013 Epigastric pain: Principal | ICD-10-CM

## 2018-01-06 LAB — CBC WITH DIFFERENTIAL/PLATELET
BASOS ABS: 0 10*3/uL (ref 0.0–0.1)
BASOS PCT: 0 %
EOS ABS: 0 10*3/uL (ref 0.0–0.7)
Eosinophils Relative: 0 %
HEMATOCRIT: 41.1 % (ref 36.0–46.0)
HEMOGLOBIN: 12.5 g/dL (ref 12.0–15.0)
Lymphocytes Relative: 7 %
Lymphs Abs: 1.1 10*3/uL (ref 0.7–4.0)
MCH: 29.3 pg (ref 26.0–34.0)
MCHC: 30.4 g/dL (ref 30.0–36.0)
MCV: 96.3 fL (ref 78.0–100.0)
Monocytes Absolute: 0.5 10*3/uL (ref 0.1–1.0)
Monocytes Relative: 3 %
NEUTROS ABS: 14.5 10*3/uL — AB (ref 1.7–7.7)
NEUTROS PCT: 90 %
Platelets: 198 10*3/uL (ref 150–400)
RBC: 4.27 MIL/uL (ref 3.87–5.11)
RDW: 14.3 % (ref 11.5–15.5)
WBC: 16.2 10*3/uL — ABNORMAL HIGH (ref 4.0–10.5)

## 2018-01-06 LAB — COMPREHENSIVE METABOLIC PANEL
ALBUMIN: 3.4 g/dL — AB (ref 3.5–5.0)
ALT: 7 U/L — ABNORMAL LOW (ref 14–54)
ANION GAP: 10 (ref 5–15)
AST: 17 U/L (ref 15–41)
Alkaline Phosphatase: 67 U/L (ref 38–126)
BILIRUBIN TOTAL: 0.6 mg/dL (ref 0.3–1.2)
BUN: 31 mg/dL — AB (ref 6–20)
CO2: 28 mmol/L (ref 22–32)
Calcium: 9.2 mg/dL (ref 8.9–10.3)
Chloride: 99 mmol/L — ABNORMAL LOW (ref 101–111)
Creatinine, Ser: 1.1 mg/dL — ABNORMAL HIGH (ref 0.44–1.00)
GFR calc Af Amer: >60 mL/min (ref >60)
GFR calc non Af Amer: 52 mL/min — ABNORMAL LOW (ref >60)
GLUCOSE: 134 mg/dL — AB (ref 65–99)
POTASSIUM: 4.2 mmol/L (ref 3.5–5.1)
SODIUM: 137 mmol/L (ref 135–145)
TOTAL PROTEIN: 7 g/dL (ref 6.5–8.1)

## 2018-01-06 MED ORDER — ZOLPIDEM TARTRATE 5 MG PO TABS
5.0000 mg | ORAL_TABLET | Freq: Every evening | ORAL | Status: AC | PRN
  Administered 2018-01-06: 5 mg via ORAL
  Filled 2018-01-06: qty 1

## 2018-01-06 NOTE — Evaluation (Signed)
Physical Therapy Evaluation Patient Details Name: Sabrina Curry MRN: [id number] DOB: Aug 19, 1951 Today's Date: 01/06/2018   History of Present Illness  Sabrina Curry is a 67 y.o. female with multiple medical problems detailed below presented to ED complaining of chest pain and epigastric pain.  Her past medical history as detailed below.  She reported that she began to have symptoms after eating pancakes for breakfast and she also had pizza later in the day.  Her symptoms persisted and worsened.  She has had some nausea but no vomiting or diarrhea.  She is having some epigastric pain as well.  Nothing seems to make the symptoms worse or better.  She has tried over-the-counter medications with no significant relief.  She has a lot of burning in the throat.  She says that she does take her Protonix daily.  She says that her her acid reflux symptoms do worsen with fatty foods.     Clinical Impression  Sabrina Curry is a 67 y.o. presenting for PT evaluation with recent decrease in functional mobility secondary to above episode of chest pain/epigastric pain. She is currently functioning near her baseline of requiring intermittent assistance with ADL's and use of a rollator to mobilize. She is limited by decreased endurance and requires supervision for transfers/gait with use of RW as well as minimal cues for safety during turns while ambulating. She will benefit from skilled PT to address current impairments and improve functional strength /endurance to progress to baseline in preparation for a safe discharge home. She has been educated on benefits of upright activity to improve activity tolerance and mobilizing to sit in chair with RN to eat meals in upright posture. She has a personal aid available for assistance 5 days/week for 8 hours each day and I anticipate that when medically stable she will be safe to discharge home. Acute PT will follow during her hospital stay.     Follow Up Recommendations No PT  follow up    Equipment Recommendations  None recommended by PT    Recommendations for Other Services       Precautions / Restrictions Precautions Precautions: Fall Restrictions Weight Bearing Restrictions: No      Mobility  Bed Mobility Overal bed mobility: Modified Independent      General bed mobility comments: required use of bed rail from flat bed  Transfers Overall transfer level: Needs assistance Equipment used: Rolling walker (2 wheeled) Transfers: Sit to/from Stand Sit to Stand: Supervision      General transfer comment: requires use of bil UE to push up for sit to stand transfer at EOB  Ambulation/Gait Ambulation/Gait assistance: Supervision;Min guard Ambulation Distance (Feet): 70 Feet Assistive device: Rolling walker (2 wheeled) Gait Pattern/deviations: Step-through pattern;Decreased stride length;Decreased step length - right;Decreased step length - left;Trunk flexed;Wide base of support   Gait velocity interpretation: <1.31 ft/sec, indicative of household ambulator        Balance Overall balance assessment: Needs assistance Sitting-balance support: No upper extremity supported;Feet supported Sitting balance-Leahy Scale: Good Sitting balance - Comments: maintained balance while performing toileting   Standing balance support: Bilateral upper extremity supported Standing balance-Leahy Scale: Fair Standing balance comment: maintained balance while washing/drying hands at sink and while donning second gown as robe to cover her back         Pertinent Vitals/Pain Pain Assessment: 0-10 Pain Score: 6  Pain Location: low back pain Pain Descriptors / Indicators: Aching Pain Intervention(s): Limited activity within patient's tolerance;Monitored during session    Home Living Family/patient expects  to be discharged to:: Private residence Living Arrangements: Spouse/significant other Available Help at Discharge: Family;Personal care attendant;Other  (Comment)(personal aid 5x/week for 8 hours per day (Mon-Fri)) Type of Home: Apartment Home Access: Level entry     Home Layout: One level Home Equipment: Walker - 4 wheels;Shower seat;Grab bars - toilet;Grab bars - tub/shower      Prior Function Level of Independence: Needs assistance   Gait / Transfers Assistance Needed: uses rollator to mobilize around her home  ADL's / Homemaking Assistance Needed: requires intermmitent assistance with bathing/dressing and with cooking/cleaning       Extremity/Trunk Assessment   Upper Extremity Assessment Upper Extremity Assessment: Overall WFL for tasks assessed    Lower Extremity Assessment Lower Extremity Assessment: Generalized weakness    Cervical / Trunk Assessment Cervical / Trunk Assessment: Kyphotic  Communication   Communication: Other (comment);Expressive difficulties(slurred speech)  Cognition Arousal/Alertness: Awake/alert Behavior During Therapy: WFL for tasks assessed/performed Overall Cognitive Status: Within Functional Limits for tasks assessed              Assessment/Plan    PT Assessment Patient needs continued PT services  PT Problem List Decreased strength;Decreased activity tolerance;Cardiopulmonary status limiting activity;Obesity       PT Treatment Interventions DME instruction;Gait training;Balance training;Functional mobility training;Patient/family education;Therapeutic activities;Therapeutic exercise    PT Goals (Current goals can be found in the Care Plan section)  Acute Rehab PT Goals Patient Stated Goal: get strong enough to return home PT Goal Formulation: With patient Time For Goal Achievement: 01/13/18 Potential to Achieve Goals: Good    Frequency Min 3X/week    AM-PAC PT "6 Clicks" Daily Activity  Outcome Measure Difficulty turning over in bed (including adjusting bedclothes, sheets and blankets)?: A Little Difficulty moving from lying on back to sitting on the side of the bed? : A  Little Difficulty sitting down on and standing up from a chair with arms (e.g., wheelchair, bedside commode, etc,.)?: A Little Help needed moving to and from a bed to chair (including a wheelchair)?: A Little Help needed walking in hospital room?: A Little Help needed climbing 3-5 steps with a railing? : A Lot 6 Click Score: 17    End of Session Equipment Utilized During Treatment: Gait belt Activity Tolerance: Patient tolerated treatment well Patient left: in bed;with call bell/phone within reach;with nursing/sitter in room(in semi-chair position) Nurse Communication: Mobility status PT Visit Diagnosis: Unsteadiness on feet (R26.81);Other abnormalities of gait and mobility (R26.89);Difficulty in walking, not elsewhere classified (R26.2);Muscle weakness (generalized) (M62.81)    Time: 9323-5573 PT Time Calculation (min) (ACUTE ONLY): 26 min   Charges:   PT Evaluation $PT Eval Low Complexity: 1 Low PT Treatments $Therapeutic Activity: 8-22 mins   PT G Codes:        Kipp Brood, PT, DPT Physical Therapist with [facility]  01/06/2018 5:09 PM

## 2018-01-06 NOTE — Progress Notes (Signed)
PROGRESS NOTE    Sabrina Curry  GNO:[id number] DOB: 1950/09/22 DOA: 01/04/2018 PCP: Rosita Fire, MD     Brief Narrative:  67 year old woman admitted from home on 4/19 due to abdominal pain, nausea. She was found to have small bowel enteritis and admission was requested.   Assessment & Plan:   Principal Problem:   Intractable epigastric abdominal pain Active Problems:   Hypertension   Thoracic aortic aneurysm (HCC)   COPD (chronic obstructive pulmonary disease) (HCC)   Chronic back pain   Chest pain at rest   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HCC)   Epigastric abdominal pain -Suspect a small bowel enteritis which seems to be improving with IV Cipro and Flagyl. -Continue antiemetics and pain medication as needed. -She is improving and is tolerating diet.  Essential hypertension -Well-controlled  Thoracic aortic aneurysm  -With slight increase in size to 4.6 cm, will need outpatient vascular surgery follow-up.  COPD -Compensated.   DVT prophylaxis: Lovenox Code Status: DNR Family Communication: Patient only Disposition Plan: Anticipate discharge home in 24 hours  Consultants:   None  Procedures:   None  Antimicrobials:  Anti-infectives (From admission, onward)   Start     Dose/Rate Route Frequency Ordered Stop   01/04/18 1200  ciprofloxacin (CIPRO) IVPB 400 mg     400 mg 200 mL/hr over 60 Minutes Intravenous Every 12 hours 01/04/18 1022     01/04/18 1200  metroNIDAZOLE (FLAGYL) IVPB 500 mg     500 mg 100 mL/hr over 60 Minutes Intravenous Every 8 hours 01/04/18 1022         Subjective: Feels much improved, tolerating diet, only minimal nausea, no pain  Objective: Vitals:   01/06/18 0726 01/06/18 0735 01/06/18 1350 01/06/18 1520  BP:    124/78  Pulse:    72  Resp:    20  Temp:    98.1 F (36.7 C)  TempSrc:      SpO2: 92% 92% 92% 95%  Weight:      Height:        Intake/Output Summary (Last 24 hours) at 01/06/2018 1709 Last data filed at  01/06/2018 1600 Gross per 24 hour  Intake 2480 ml  Output -  Net 2480 ml   Filed Weights   01/04/18 0145 01/04/18 0947  Weight: 126.6 kg (279 lb) 127.5 kg (281 lb 1.4 oz)    Examination:  General exam: Alert, awake, oriented x 3 Respiratory system: Clear to auscultation. Respiratory effort normal. Cardiovascular system:RRR. No murmurs, rubs, gallops. Gastrointestinal system: Abdomen is nondistended, soft and nontender. No organomegaly or masses felt. Normal bowel sounds heard. Central nervous system: Alert and oriented. No focal neurological deficits. Extremities: No C/C/E, +pedal pulses Skin: No rashes, lesions or ulcers Psychiatry: Judgement and insight appear normal. Mood & affect appropriate.     Data Reviewed: I have personally reviewed following labs and imaging studies  CBC: Recent Labs  Lab 01/04/18 0207 01/06/18 0626  WBC 10.0 16.2*  NEUTROABS  --  14.5*  HGB 13.3 12.5  HCT 42.7 41.1  MCV 94.1 96.3  PLT 215 169   Basic Metabolic Panel: Recent Labs  Lab 01/04/18 0207 01/05/18 0611 01/06/18 0626  NA 140 139 137  K 3.7 4.0 4.2  CL 96* 98* 99*  CO2 29 28 28   GLUCOSE 142* 155* 134*  BUN 17 23* 31*  CREATININE 1.12* 1.11* 1.10*  CALCIUM 10.3 9.6 9.2  MG  --  1.9  --    GFR: Estimated Creatinine Clearance: 61.6  mL/min (A) (by C-G formula based on SCr of 1.1 mg/dL (H)). Liver Function Tests: Recent Labs  Lab 01/04/18 0207 01/05/18 0611 01/06/18 0626  AST 16 16 17   ALT 5* 5* 7*  ALKPHOS 94 72 67  BILITOT 1.0 0.7 0.6  PROT 8.3* 7.1 7.0  ALBUMIN 4.0 3.4* 3.4*   Recent Labs  Lab 01/04/18 0207  LIPASE 31   No results for input(s): AMMONIA in the last 168 hours. Coagulation Profile: No results for input(s): INR, PROTIME in the last 168 hours. Cardiac Enzymes: Recent Labs  Lab 01/04/18 1220  TROPONINI <0.03   BNP (last 3 results) No results for input(s): PROBNP in the last 8760 hours. HbA1C: No results for input(s): HGBA1C in the last 72  hours. CBG: No results for input(s): GLUCAP in the last 168 hours. Lipid Profile: No results for input(s): CHOL, HDL, LDLCALC, TRIG, CHOLHDL, LDLDIRECT in the last 72 hours. Thyroid Function Tests: No results for input(s): TSH, T4TOTAL, FREET4, T3FREE, THYROIDAB in the last 72 hours. Anemia Panel: No results for input(s): VITAMINB12, FOLATE, FERRITIN, TIBC, IRON, RETICCTPCT in the last 72 hours. Urine analysis:    Component Value Date/Time   COLORURINE YELLOW 01/04/2018 0209   APPEARANCEUR CLEAR 01/04/2018 0209   LABSPEC 1.012 01/04/2018 0209   PHURINE 7.0 01/04/2018 0209   GLUCOSEU NEGATIVE 01/04/2018 0209   HGBUR NEGATIVE 01/04/2018 0209   BILIRUBINUR NEGATIVE 01/04/2018 0209   KETONESUR NEGATIVE 01/04/2018 0209   PROTEINUR 30 (A) 01/04/2018 0209   UROBILINOGEN 0.2 06/23/2015 2350   NITRITE NEGATIVE 01/04/2018 0209   LEUKOCYTESUR NEGATIVE 01/04/2018 0209   Sepsis Labs: @LABRCNTIP (QVZDGLOVFIEPP:2,RJJOACZYSAY:3)  )No results found for this or any previous visit (from the past 240 hour(s)).       Radiology Studies: Dg Chest Port 1 View  Result Date: 01/05/2018 CLINICAL DATA:  Cough and shortness of breath. Patient admitted to the hospital for chest pain 01/04/2018. EXAM: PORTABLE CHEST 1 VIEW COMPARISON:  PA and lateral chest 01/04/2018 and 11/19/2017. FINDINGS: There is pulmonary vascular congestion. No consolidative process, pneumothorax or effusion. Heart size is upper normal. IMPRESSION: Pulmonary vascular congestion.  No focal abnormality. Electronically Signed   By: Inge Rise M.D.   On: 01/05/2018 08:46        Scheduled Meds: . amLODipine  5 mg Oral Daily  . aspirin  162 mg Oral Daily  . enoxaparin (LOVENOX) injection  40 mg Subcutaneous Q24H  . feeding supplement (ENSURE ENLIVE)  237 mL Oral BID BM  . feeding supplement (PRO-STAT SUGAR FREE 64)  30 mL Oral BID  . ipratropium-albuterol  3 mL Nebulization TID  . isosorbide mononitrate  30 mg Oral Daily    . methylPREDNISolone (SOLU-MEDROL) injection  60 mg Intravenous Q6H  . metoprolol tartrate  50 mg Oral BID  . mometasone-formoterol  2 puff Inhalation BID  . pantoprazole  80 mg Oral Daily  . senna-docusate  2 tablet Oral BID   Continuous Infusions: . ciprofloxacin Stopped (01/06/18 1314)  . metronidazole Stopped (01/06/18 1314)     LOS: 1 day    Time spent: 25 minutes.    Lelon Frohlich, MD [facility] Pager [phone]  If 7PM-7AM, please contact night-coverage [email or web address] Password [email or web address] 01/06/2018, 5:09 PM

## 2018-01-07 LAB — CBC
HCT: 38.8 % (ref 36.0–46.0)
Hemoglobin: 11.9 g/dL — ABNORMAL LOW (ref 12.0–15.0)
MCH: 29.4 pg (ref 26.0–34.0)
MCHC: 30.7 g/dL (ref 30.0–36.0)
MCV: 95.8 fL (ref 78.0–100.0)
PLATELETS: 182 10*3/uL (ref 150–400)
RBC: 4.05 MIL/uL (ref 3.87–5.11)
RDW: 14.2 % (ref 11.5–15.5)
WBC: 11.8 10*3/uL — AB (ref 4.0–10.5)

## 2018-01-07 MED ORDER — CIPROFLOXACIN HCL 500 MG PO TABS: 500.0000 mg | ORAL_TABLET | Freq: Two times a day (BID) | ORAL | 0 refills | Status: AC

## 2018-01-07 MED ORDER — PREDNISONE 10 MG PO TABS: 10.0000 mg | ORAL_TABLET | Freq: Every day | ORAL | 0 refills | Status: DC

## 2018-01-07 MED ORDER — METRONIDAZOLE 500 MG PO TABS: 500.0000 mg | ORAL_TABLET | Freq: Three times a day (TID) | ORAL | 0 refills | Status: AC

## 2018-01-07 NOTE — Progress Notes (Signed)
Patient IV removed, tolerated well. Patient given discharge instructions at bedside.

## 2018-01-07 NOTE — Discharge Summary (Signed)
Physician Discharge Summary  Sabrina Curry GOT:[id number] DOB: 09/19/1950 DOA: 01/04/2018  PCP: Rosita Fire, MD  Admit date: 01/04/2018 Discharge date: 01/07/2018  Time spent: 45 minutes  Recommendations for Outpatient Follow-up:  -To be discharged home today. -We will need to complete another 7 days of Cipro and Flagyl for small bowel enteritis. -Advised to follow-up with her primary care provider in 2 weeks. -Continue outpatient follow-up with vascular surgery for thoracic aortic aneurysm.  Discharge Diagnoses:  Principal Problem:   Intractable epigastric abdominal pain Active Problems:   Hypertension   Thoracic aortic aneurysm (HCC)   COPD (chronic obstructive pulmonary disease) (HCC)   Chronic back pain   Chest pain at rest   Regional enteritis of small bowel [facility])   Discharge Condition: Stable and improved  Filed Weights   01/04/18 0145 01/04/18 0947  Weight: 126.6 kg (279 lb) 127.5 kg (281 lb 1.4 oz)    History of present illness:  As per Dr. Wynetta Emery on 4/19: Sabrina Curry is a 67 y.o. female with multiple medical problems detailed below presented to ED complaining of chest pain and epigastric pain.  Her past medical history as detailed below.  She reported that she began to have symptoms after eating pancakes for breakfast and she also had pizza later in the day.  Her symptoms persisted and worsened.  She has had some nausea but no vomiting or diarrhea.  She is having some epigastric pain as well.  Nothing seems to make the symptoms worse or better.  She has tried over-the-counter medications with no significant relief.  She has a lot of burning in the throat.  She says that she does take her Protonix daily.  She says that her her acid reflux symptoms do worsen with fatty foods.  ED course: The patient was evaluated in the ED and noted to have an initial troponin that was in the normal range.  Her chest x-ray and urinalysis was unremarkable.  She did have an abnormal  CT of the abdomen that was suspicious for small bowel enteritis.  She is being admitted for further evaluation and management.    Hospital Course:   Epigastric abdominal pain -Suspect a small bowel enteritis which seems to be improving with IV Cipro and Flagyl. -We will transition antibiotics to p.o. and continue for an additional 7 days. -She is improving and is tolerating diet.  Essential hypertension -Well-controlled  Thoracic aortic aneurysm  -With slight increase in size to 4.6 cm, will need outpatient vascular surgery follow-up.  COPD -Compensated.      Procedures:  None  Consultations:  None  Discharge Instructions  Discharge Instructions    Diet - low sodium heart healthy   Complete by:  As directed    Increase activity slowly   Complete by:  As directed      Allergies as of 01/07/2018      Reactions   Vancomycin Cross Reactors Anaphylaxis      Medication List    STOP taking these medications   oxyCODONE-acetaminophen 5-325 MG tablet Commonly known as:  PERCOCET/ROXICET     TAKE these medications   amLODipine 10 MG tablet Commonly known as:  NORVASC Take 0.5 tablets (5 mg total) by mouth daily.   aspirin 81 MG tablet Take 2 tablets (162 mg total) by mouth daily.   ciprofloxacin 500 MG tablet Commonly known as:  CIPRO Take 1 tablet (500 mg total) by mouth 2 (two) times daily for 7 days.   isosorbide mononitrate 30  MG 24 hr tablet Commonly known as:  IMDUR TAKE (1) TABLET BY MOUTH ONCE DAILY.   metoprolol tartrate 100 MG tablet Commonly known as:  LOPRESSOR Take 0.5 tablets (50 mg total) by mouth 2 (two) times daily.   metroNIDAZOLE 500 MG tablet Commonly known as:  FLAGYL Take 1 tablet (500 mg total) by mouth 3 (three) times daily for 7 days.   OXYGEN Inhale into the lungs.   pantoprazole 40 MG tablet Commonly known as:  PROTONIX Take 1 tablet (40 mg total) by mouth daily.   potassium chloride 10 MEQ tablet Commonly known as:   K-DUR TAKE ONE TABLET BY MOUTH TWICE DAILY.   predniSONE 10 MG tablet Commonly known as:  DELTASONE Take 1 tablet (10 mg total) by mouth daily with breakfast.   PROAIR HFA 108 (90 Base) MCG/ACT inhaler Generic drug:  albuterol Inhale 1 puff into the lungs every 6 (six) hours as needed for wheezing or shortness of breath.   promethazine 25 MG tablet Commonly known as:  PHENERGAN Take 25 mg by mouth every 6 (six) hours as needed for nausea or vomiting.   SYMBICORT 160-4.5 MCG/ACT inhaler Generic drug:  budesonide-formoterol Inhale 2 puffs into the lungs daily.   vitamin B-12 1000 MCG tablet Commonly known as:  CYANOCOBALAMIN Take 1 tablet (1,000 mcg total) by mouth daily.      Allergies  Allergen Reactions  . Vancomycin Cross Reactors Anaphylaxis   Follow-up Information    Rosita Fire, MD. Schedule an appointment as soon as possible for a visit in 2 weeks.   Specialty:  Internal Medicine Contact information: [place] [address] [phone]            The results of significant diagnostics from this hospitalization (including imaging, microbiology, ancillary and laboratory) are listed below for reference.    Significant Diagnostic Studies: Ct Abdomen Pelvis Wo Contrast  Result Date: 01/04/2018 CLINICAL DATA:  Abdominal distention and vomiting. Bilateral leg swelling and purple toes. EXAM: CT ABDOMEN AND PELVIS WITHOUT CONTRAST TECHNIQUE: Multidetector CT imaging of the abdomen and pelvis was performed following the standard protocol without IV contrast. COMPARISON:  06/23/2015 FINDINGS: Lower chest: Top-normal size heart. Aneurysmal dilatation of the ascending thoracic aorta to 4.6 cm. Dilatation of the main pulmonary artery to 4.1 cm consistent with chronic pulmonary hypertension. There is atelectasis in the right middle lobe and both lower lobes. No effusion or pneumothorax. Hepatobiliary: Status post cholecystectomy. The unenhanced liver  is unremarkable. No biliary dilatation. Pancreas: Fatty atrophy of the pancreas. Spleen: Normal size spleen without mass. Adrenals/Urinary Tract: Adrenal glands are unremarkable. Kidneys are normal, without renal calculi, focal lesion, or hydronephrosis. Bladder is unremarkable. Stomach/Bowel: Physiologic distention of the stomach. Mild fluid-filled distention of small bowel loops question enteritis. No mechanical source of bowel obstruction. Small bowel anastomotic sutures appear intact without bowel obstruction. Status post appendectomy. Vascular/Lymphatic: Mild aortoiliac atherosclerosis without aneurysm. No adenopathy. Reproductive: Status post hysterectomy.  No adnexal mass. Other: The patient has undergone prior incisional and umbilical hernia repair. There is herniation of omental fat, a short segment of transverse colon as well as small bowel without incarceration along the cephalad margin of a ventral mesh repair, series 6/70. This hernia measures approximately 6.2 cm craniocaudad by 7.1 cm transverse. Musculoskeletal: Lower lumbar degenerative disc disease. No acute nor suspicious osseous abnormalities. IMPRESSION: 1. Supraumbilical ventral hernia abutting the upper margin of a ventral mesh repair. This measures approximately 6.2 x 7.1 cm in craniocaudad by transverse dimension and  contains omental fat, short segment of transverse colon and small intestine. No incarceration or bowel obstruction is seen. 2. Mild fluid-filled distention of jejunum without definite mechanical bowel obstruction noted. Findings may represent a small bowel enteritis. Intact small bowel anastomosis without obstruction. 3. 4.6 cm ascending thoracic aortic aneurysm from 4.4 cm previously. Ascending thoracic aortic aneurysm. Recommend semi-annual imaging followup by CTA or MRA and referral to cardiothoracic surgery if not already obtained. This recommendation follows 2010 ACCF/AHA/AATS/ACR/ASA/SCA/SCAI/SIR/STS/SVM Guidelines for the  Diagnosis and Management of Patients With Thoracic Aortic Disease. Circulation. 2010; 121: Z563-O756 4. Lower lumbar spondylosis. 5. Fatty atrophy of the pancreas. Electronically Signed   By: Ashley Royalty M.D.   On: 01/04/2018 03:02   Dg Chest 2 View  Result Date: 01/04/2018 CLINICAL DATA:  Chest pain since 3 p.m. yesterday.  Dyspnea. EXAM: CHEST - 2 VIEW COMPARISON:  11/19/2017 FINDINGS: Stable mild cardiomegaly with slight uncoiling of the thoracic aorta. No acute pulmonary consolidation or CHF. No effusion. Linear density along the right mid lung may represent the minor fissure versus scarring. IMPRESSION: No active cardiopulmonary disease. Electronically Signed   By: Ashley Royalty M.D.   On: 01/04/2018 02:48   Dg Chest Port 1 View  Result Date: 01/05/2018 CLINICAL DATA:  Cough and shortness of breath. Patient admitted to the hospital for chest pain 01/04/2018. EXAM: PORTABLE CHEST 1 VIEW COMPARISON:  PA and lateral chest 01/04/2018 and 11/19/2017. FINDINGS: There is pulmonary vascular congestion. No consolidative process, pneumothorax or effusion. Heart size is upper normal. IMPRESSION: Pulmonary vascular congestion.  No focal abnormality. Electronically Signed   By: Inge Rise M.D.   On: 01/05/2018 08:46    Microbiology: No results found for this or any previous visit (from the past 240 hour(s)).   Labs: Basic Metabolic Panel: Recent Labs  Lab 01/04/18 0207 01/05/18 0611 01/06/18 0626  NA 140 139 137  K 3.7 4.0 4.2  CL 96* 98* 99*  CO2 29 28 28   GLUCOSE 142* 155* 134*  BUN 17 23* 31*  CREATININE 1.12* 1.11* 1.10*  CALCIUM 10.3 9.6 9.2  MG  --  1.9  --    Liver Function Tests: Recent Labs  Lab 01/04/18 0207 01/05/18 0611 01/06/18 0626  AST 16 16 17   ALT 5* 5* 7*  ALKPHOS 94 72 67  BILITOT 1.0 0.7 0.6  PROT 8.3* 7.1 7.0  ALBUMIN 4.0 3.4* 3.4*   Recent Labs  Lab 01/04/18 0207  LIPASE 31   No results for input(s): AMMONIA in the last 168 hours. CBC: Recent Labs    Lab 01/04/18 0207 01/06/18 0626 01/07/18 0559  WBC 10.0 16.2* 11.8*  NEUTROABS  --  14.5*  --   HGB 13.3 12.5 11.9*  HCT 42.7 41.1 38.8  MCV 94.1 96.3 95.8  PLT 215 198 182   Cardiac Enzymes: Recent Labs  Lab 01/04/18 1220  TROPONINI <0.03   BNP: BNP (last 3 results) Recent Labs    01/04/18 0207  BNP 171.0*    ProBNP (last 3 results) No results for input(s): PROBNP in the last 8760 hours.  CBG: No results for input(s): GLUCAP in the last 168 hours.     Signed:  Lelon Frohlich  [facility] Pager: [phone] 01/07/2018, 12:40 PM

## 2018-03-18 ENCOUNTER — Other Ambulatory Visit: Payer: Self-pay | Admitting: Cardiovascular Disease

## 2018-04-24 ENCOUNTER — Other Ambulatory Visit: Payer: Self-pay | Admitting: Cardiovascular Disease

## 2018-05-20 ENCOUNTER — Other Ambulatory Visit: Payer: Self-pay | Admitting: Cardiovascular Disease

## 2018-06-07 ENCOUNTER — Other Ambulatory Visit (HOSPITAL_COMMUNITY): Payer: Self-pay | Admitting: Internal Medicine

## 2018-06-07 DIAGNOSIS — I712 Thoracic aortic aneurysm, without rupture, unspecified: Secondary | ICD-10-CM

## 2018-06-07 DIAGNOSIS — Z1231 Encounter for screening mammogram for malignant neoplasm of breast: Secondary | ICD-10-CM

## 2018-06-17 ENCOUNTER — Other Ambulatory Visit: Payer: Self-pay | Admitting: Cardiovascular Disease

## 2018-06-19 ENCOUNTER — Ambulatory Visit (HOSPITAL_COMMUNITY)
Admission: RE | Admit: 2018-06-19 | Discharge: 2018-06-19 | Disposition: A | Discharge status: Home / Self Care | Payer: Medicare Other | Source: Ambulatory Visit | Attending: Internal Medicine | Admitting: Internal Medicine

## 2018-06-19 DIAGNOSIS — Z1231 Encounter for screening mammogram for malignant neoplasm of breast: Secondary | ICD-10-CM | POA: Diagnosis present

## 2018-06-19 DIAGNOSIS — I712 Thoracic aortic aneurysm, without rupture, unspecified: Secondary | ICD-10-CM

## 2018-06-19 LAB — POCT I-STAT CREATININE: Creatinine, Ser: 1.1 mg/dL — ABNORMAL HIGH (ref 0.44–1.00)

## 2018-06-25 ENCOUNTER — Ambulatory Visit (HOSPITAL_COMMUNITY): Payer: Medicare Other

## 2018-07-11 ENCOUNTER — Ambulatory Visit (HOSPITAL_COMMUNITY): Payer: Medicare Other

## 2018-07-11 ENCOUNTER — Encounter (HOSPITAL_COMMUNITY): Payer: Self-pay

## 2018-07-18 ENCOUNTER — Other Ambulatory Visit: Payer: Self-pay | Admitting: Cardiovascular Disease

## 2018-09-12 ENCOUNTER — Other Ambulatory Visit: Payer: Self-pay | Admitting: Cardiovascular Disease

## 2018-09-23 ENCOUNTER — Encounter (HOSPITAL_COMMUNITY): Payer: Self-pay | Admitting: Speech Pathology

## 2018-09-23 NOTE — Therapy (Signed)
[place], [place], [address] Phone: [phone]   Fax:  [phone]  Patient Details  Name: Ysabella Babiarz MRN: [id number] Date of Birth: November 22, 1950 Referring Provider: Rosita Fire, MD   Encounter Date: 09/23/2018 SPEECH THERAPY DISCHARGE SUMMARY  Visits from Start of Care: 2  Current functional level related to goals / functional outcomes: N/A   Remaining deficits: Same as evaluation   Education / Equipment: N/A  Plan: Patient agrees to discharge.  Patient goals were not met. Patient is being discharged due to not returning since the last visit.  ?????         Thank you,  Genene Churn, [place]  [facility] 09/23/2018, 3:40 PM  [place] [address] [place], [place], [address] Phone: [phone]   Fax:  [phone]

## 2018-11-05 ENCOUNTER — Ambulatory Visit: Payer: Medicare Other | Admitting: Student

## 2018-11-18 ENCOUNTER — Ambulatory Visit: Payer: 59 | Admitting: Cardiology

## 2018-12-04 NOTE — Progress Notes (Deleted)
Cardiology Office Note    Date:  12/04/2018   ID:  Sabrina, Curry [birth year], MRN [id number]  PCP:  Sabrina Fire, MD  Cardiologist: Sabrina Sable, MD EPS: None  No chief complaint on file.   History of Present Illness:  Sabrina Curry is a 68 y.o. female with history of PSVT, malignant hypertension, thoracic aortic aneurysm-followed by CT surgery 4.6 cm 12/2017, history of TIA, anxiety disorder and history of chest pain-normal dobutamine stress echo 07/10/2012, chronic lower extremity edema and morbid obesity on chronic O2.  2D echo in 2019 was normal LV systolic and diastolic function EF 55 to 60% with mild MR and mildly dilated ascending aorta.  Patient last saw Dr. Bronson Curry 05/2017.  She was added onto my schedule because of recent trouble with hypertension at her neurologist office.  Past Medical History:  Diagnosis Date  . Anxiety   . Arthritis   . Asthma   . Chronic back pain   . COPD (chronic obstructive pulmonary disease) ([place])   . Edema of both legs    chronic  . GERD (gastroesophageal reflux disease)   . H/O dizziness   . H/O shortness of breath   . Headache(784.0)   . Hx of migraines   . Hypertension   . Knee pain, bilateral   . Obstructive sleep apnea    On CPAP by nasal prong  . Palpitations 12/2012   ? SVT  . Seasonal allergies   . Stroke [facility])    last stoke Oct 2016  . Thoracic aortic aneurysm [facility]) 12/2012    12/2012: 4.3 cm fusiform aneurysm-ascending thoracic aorta  . Varicose veins of both legs with edema     Past Surgical History:  Procedure Laterality Date  . ABDOMINAL HYSTERECTOMY    . APPENDECTOMY     with gallbladder removal  . BACK SURGERY     had staff infection of back  . CATARACT EXTRACTION W/PHACO  01/16/2012   Procedure: CATARACT EXTRACTION PHACO AND INTRAOCULAR LENS PLACEMENT (IOC);  Surgeon: Sabrina Guadeloupe T. Gershon Crane, MD;  Location: [facility];  Service: Ophthalmology;  Laterality: Right;  CDE 6.12  . CHOLECYSTECTOMY     [facility]  .  HERNIA REPAIR  2009   inscional hernia repair x2-3  . UMBILICAL HERNIA REPAIR      Current Medications: No outpatient medications have been marked as taking for the 12/09/18 encounter (Appointment) with Sabrina Burn, PA-C.     Allergies:   Vancomycin cross reactors   Social History   Socioeconomic History  . Marital status: Widowed    Spouse name: Not on file  . Number of children: Not on file  . Years of education: Not on file  . Highest education level: Not on file  Occupational History  . Occupation: CNA-retired    Comment: [employer]  Social Needs  . Financial resource strain: Not on file  . Food insecurity:    Worry: Not on file    Inability: Not on file  . Transportation needs:    Medical: Not on file    Non-medical: Not on file  Tobacco Use  . Smoking status: Never Smoker  . Smokeless tobacco: Never Used  Substance and Sexual Activity  . Alcohol use: No    Alcohol/week: 0.0 standard drinks  . Drug use: No  . Sexual activity: Yes    Birth control/protection: Post-menopausal, Surgical  Lifestyle  . Physical activity:    Days per week: Not on file    Minutes per session:  Not on file  . Stress: Not on file  Relationships  . Social connections:    Talks on phone: Not on file    Gets together: Not on file    Attends religious service: Not on file    Active member of club or organization: Not on file    Attends meetings of clubs or organizations: Not on file    Relationship status: Not on file  Other Topics Concern  . Not on file  Social History Narrative  . Not on file     Family History:  The patient's ***family history includes Diabetes in her mother; Hyperlipidemia in her father; Hypertension in her mother.   ROS:   Please see the history of present illness.    ROS All other systems reviewed and are negative.   PHYSICAL EXAM:   VS:  There were no vitals taken for this visit.  Physical Exam  GEN: Well nourished, well developed, in no  acute distress  HEENT: normal  Neck: no JVD, carotid bruits, or masses Cardiac:RRR; no murmurs, rubs, or gallops  Respiratory:  clear to auscultation bilaterally, normal work of breathing GI: soft, nontender, nondistended, + BS Ext: without cyanosis, clubbing, or edema, Good distal pulses bilaterally MS: no deformity or atrophy  Skin: warm and dry, no rash Neuro:  Alert and Oriented x 3, Strength and sensation are intact Psych: euthymic mood, full affect  Wt Readings from Last 3 Encounters:  01/04/18 281 lb 1.4 oz (127.5 kg)  11/26/17 279 lb (126.6 kg)  11/19/17 270 lb (122.5 kg)      Studies/Labs Reviewed:   EKG:  EKG is*** ordered today.  The ekg ordered today demonstrates ***  Recent Labs: 01/04/2018: B Natriuretic Peptide 171.0 01/05/2018: Magnesium 1.9 01/06/2018: ALT 7; BUN 31; Potassium 4.2; Sodium 137 01/07/2018: Hemoglobin 11.9; Platelets 182 06/19/2018: Creatinine, Ser 1.10   Lipid Panel    Component Value Date/Time   CHOL 131 11/20/2017 0609   TRIG 139 11/20/2017 0609   HDL 45 11/20/2017 0609   CHOLHDL 2.9 11/20/2017 0609   VLDL 28 11/20/2017 0609   LDLCALC 58 11/20/2017 0609    Additional studies/ records that were reviewed today include:  2D echo 5/2017Study Conclusions   - Left ventricle: The cavity size was normal. Wall thickness was   increased in a pattern of mild LVH. Systolic function was normal.   The estimated ejection fraction was in the range of 60% to 65%.   Wall motion was normal; there were no regional wall motion   abnormalities. The study is not technically sufficient to allow   evaluation of LV diastolic function. - Aortic valve: Mildly calcified annulus. Trileaflet; normal   thickness leaflets. Valve area (VTI): 2.84 cm^2. Valve area   (Vmax): 2.36 cm^2. Valve area (Vmean): 2.43 cm^2. - Technically difficult study. Echocontrast was used to enhance   visualization.   CT of the abdomen 4/2019IMPRESSION: 1. Supraumbilical ventral hernia  abutting the upper margin of a ventral mesh repair. This measures approximately 6.2 x 7.1 cm in craniocaudad by transverse dimension and contains omental fat, short segment of transverse colon and small intestine. No incarceration or bowel obstruction is seen. 2. Mild fluid-filled distention of jejunum without definite mechanical bowel obstruction noted. Findings may represent a small bowel enteritis. Intact small bowel anastomosis without obstruction. 3. 4.6 cm ascending thoracic aortic aneurysm from 4.4 cm previously. Ascending thoracic aortic aneurysm. Recommend semi-annual imaging followup by CTA or MRA and referral to cardiothoracic surgery if not already  obtained. This recommendation follows 2010 ACCF/AHA/AATS/ACR/ASA/SCA/SCAI/SIR/STS/SVM Guidelines for the Diagnosis and Management of Patients With Thoracic Aortic Disease. Circulation. 2010; 121: L373-S287 4. Lower lumbar spondylosis. 5. Fatty atrophy of the pancreas.     Electronically Signed   By: Ashley Royalty M.D.   On: 01/04/2018 03:02     ASSESSMENT:    1. Essential hypertension   2. Paroxysmal supraventricular tachycardia-nonsustained   3. Thoracic aortic aneurysm without rupture ([place])   4. Morbid obesity ([place])      PLAN:  In order of problems listed above:  Malignant hypertension  PSVT  Thoracic aneurysm 4.6 cm 12/2017  Morbid obesity  Chronic lower extremity edema felt secondary to obesity and venous insufficiency  History of TIA followed by neurology Medication Adjustments/Labs and Tests Ordered: Current medicines are reviewed at length with the patient today.  Concerns regarding medicines are outlined above.  Medication changes, Labs and Tests ordered today are listed in the Patient Instructions below. There are no Patient Instructions on file for this visit.   Signed, Ermalinda Barrios, PA-C  12/04/2018 1:12 PM    [facility] [place], [place], [place]  [address] Phone:  [phone]; Fax: [phone]

## 2018-12-09 ENCOUNTER — Ambulatory Visit: Payer: 59 | Admitting: Physician Assistant

## 2019-04-04 ENCOUNTER — Other Ambulatory Visit: Payer: Self-pay | Admitting: Cardiovascular Disease

## 2019-06-05 ENCOUNTER — Telehealth: Payer: Self-pay | Admitting: Cardiovascular Disease

## 2019-06-05 NOTE — Telephone Encounter (Signed)
Virtual Visit Pre-Appointment Phone Call  "(Name), I am calling you today to discuss your upcoming appointment. We are currently trying to limit exposure to the virus that causes COVID-19 by seeing patients at home rather than in the office."  1. "What is the BEST phone number to call the day of the visit?" - include this in appointment notes  2. Do you have or have access to (through a family member/friend) a smartphone with video capability that we can use for your visit?" a. If yes - list this number in appt notes as cell (if different from BEST phone #) and list the appointment type as a VIDEO visit in appointment notes b. If no - list the appointment type as a PHONE visit in appointment notes  3. Confirm consent - "In the setting of the current Covid19 crisis, you are scheduled for a (phone or video) visit with your provider on (date) at (time).  Just as we do with many in-office visits, in order for you to participate in this visit, we must obtain consent.  If you'd like, I can send this to your mychart (if signed up) or email for you to review.  Otherwise, I can obtain your verbal consent now.  All virtual visits are billed to your insurance company just like a normal visit would be.  By agreeing to a virtual visit, we'd like you to understand that the technology does not allow for your provider to perform an examination, and thus may limit your provider's ability to fully assess your condition. If your provider identifies any concerns that need to be evaluated in person, we will make arrangements to do so.  Finally, though the technology is pretty good, we cannot assure that it will always work on either your or our end, and in the setting of a video visit, we may have to convert it to a phone-only visit.  In either situation, we cannot ensure that we have a secure connection.  Are you willing to proceed?" STAFF: Did the patient verbally acknowledge consent to telehealth visit? Document  YES/NO here: Yes  4. Advise patient to be prepared - "Two hours prior to your appointment, go ahead and check your blood pressure, pulse, oxygen saturation, and your weight (if you have the equipment to check those) and write them all down. When your visit starts, your provider will ask you for this information. If you have an Apple Watch or Kardia device, please plan to have heart rate information ready on the day of your appointment. Please have a pen and paper handy nearby the day of the visit as well."  5. Give patient instructions for MyChart download to smartphone OR [email or web address] as below if video visit (depending on what platform provider is using)  6. Inform patient they will receive a phone call 15 minutes prior to their appointment time (may be from unknown caller ID) so they should be prepared to answer    TELEPHONE CALL NOTE  Atticus Laski has been deemed a candidate for a follow-up tele-health visit to limit community exposure during the Covid-19 pandemic. I spoke with the patient via phone to ensure availability of phone/video source, confirm preferred email & phone number, and discuss instructions and expectations.  I reminded Laura Caldas to be prepared with any vital sign and/or heart rhythm information that could potentially be obtained via home monitoring, at the time of her visit. I reminded Nacole Fluhr to expect a phone call prior to her visit.  Orinda Kenner 06/05/2019 9:47 AM

## 2019-07-03 ENCOUNTER — Telehealth: Payer: 59 | Admitting: Cardiovascular Disease

## 2019-07-09 ENCOUNTER — Telehealth (INDEPENDENT_AMBULATORY_CARE_PROVIDER_SITE_OTHER): Payer: 59 | Admitting: Cardiovascular Disease

## 2019-07-09 ENCOUNTER — Other Ambulatory Visit: Payer: Self-pay

## 2019-07-09 ENCOUNTER — Encounter: Payer: Self-pay | Admitting: Cardiovascular Disease

## 2019-07-09 VITALS — BP 109/73 | HR 103 | Ht <= 58 in

## 2019-07-09 DIAGNOSIS — I83893 Varicose veins of bilateral lower extremities with other complications: Secondary | ICD-10-CM

## 2019-07-09 DIAGNOSIS — I712 Thoracic aortic aneurysm, without rupture, unspecified: Secondary | ICD-10-CM

## 2019-07-09 DIAGNOSIS — I471 Supraventricular tachycardia, unspecified: Secondary | ICD-10-CM

## 2019-07-09 DIAGNOSIS — R079 Chest pain, unspecified: Secondary | ICD-10-CM

## 2019-07-09 DIAGNOSIS — R002 Palpitations: Secondary | ICD-10-CM

## 2019-07-09 MED ORDER — METOPROLOL TARTRATE 50 MG PO TABS: 75.0000 mg | ORAL_TABLET | Freq: Two times a day (BID) | ORAL | 3 refills | Status: AC

## 2019-07-09 NOTE — Patient Instructions (Signed)
Medication Instructions:  Stop AMLODIPINE   INCREASE LOPRESSOR TO 75 MG - TWO TIMES DAILY   Labwork: NONE  Testing/Procedures: NONE  Follow-Up: Your physician wants you to follow-up in: 1 YEAR.  You will receive a reminder letter in the mail two months in advance. If you don't receive a letter, please call our office to schedule the follow-up appointment.   Any Other Special Instructions Will Be Listed Below (If Applicable).   You have been referred to Cardiothoracic Surgery, THEY WILL CONTACT YOU ONCE INSURANCE APPROVES THE REFERRAL    If you need a refill on your cardiac medications before your next appointment, please call your pharmacy.

## 2019-07-09 NOTE — Progress Notes (Signed)
Virtual Visit via Telephone Note   This visit type was conducted due to national recommendations for restrictions regarding the COVID-19 Pandemic (e.g. social distancing) in an effort to limit this patient's exposure and mitigate transmission in our community.  Due to her co-morbid illnesses, this patient is at least at moderate risk for complications without adequate follow up.  This format is felt to be most appropriate for this patient at this time.  The patient did not have access to video technology/had technical difficulties with video requiring transitioning to audio format only (telephone).  All issues noted in this document were discussed and addressed.  No physical exam could be performed with this format.  Please refer to the patient's chart for her  consent to telehealth for [facility].   Date:  07/09/2019   ID:  Sabrina Curry, Sabrina Curry [birth year], MRN [id number]  Patient Location: Home Provider Location: Home  PCP:  Rosita Fire, MD  Cardiologist:  Kate Sable, MD  Electrophysiologist:  None   Evaluation Performed:  Follow-Up Visit  Chief Complaint: PSVT  History of Present Illness:    Sabrina Curry is a 68 y.o. female with a history of PSVT, hypertension, TIA, thoracic aortic aneurysm, and chronic lower extremity edema.  I have not evaluated her since 05/22/2017.  She was previously evaluated by vascular surgery regarding her lower extremity edema and they said nothing could be done.  They offered compression stockings.  It appears she has not been following with CT surgery with respect to monitoring of her thoracic aortic aneurysm.  I spoke to both the patient and her daughter, Fransico Setters.  She has a new PCP who does housecalls.  He has come to her house once to evaluate her.  The patient denies chest pain.  She has chronic bilateral leg edema which fluctuates.  Her daughter told me that she did buy her a pair of compression stockings.  The patient has a  history of anxiety.  She complains of palpitations and her heart racing.  She is mildly tachycardic today.   Past Medical History:  Diagnosis Date  . Anxiety   . Arthritis   . Asthma   . Chronic back pain   . COPD (chronic obstructive pulmonary disease) ([place])   . Edema of both legs    chronic  . GERD (gastroesophageal reflux disease)   . H/O dizziness   . H/O shortness of breath   . Headache(784.0)   . Hx of migraines   . Hypertension   . Knee pain, bilateral   . Obstructive sleep apnea    On CPAP by nasal prong  . Palpitations 12/2012   ? SVT  . Seasonal allergies   . Stroke [facility])    last stoke Oct 2016  . Thoracic aortic aneurysm [facility]) 12/2012    12/2012: 4.3 cm fusiform aneurysm-ascending thoracic aorta  . Varicose veins of both legs with edema    Past Surgical History:  Procedure Laterality Date  . ABDOMINAL HYSTERECTOMY    . APPENDECTOMY     with gallbladder removal  . BACK SURGERY     had staff infection of back  . CATARACT EXTRACTION W/PHACO  01/16/2012   Procedure: CATARACT EXTRACTION PHACO AND INTRAOCULAR LENS PLACEMENT (IOC);  Surgeon: Elta Guadeloupe T. Gershon Crane, MD;  Location: [facility];  Service: Ophthalmology;  Laterality: Right;  CDE 6.12  . CHOLECYSTECTOMY     [facility]  . HERNIA REPAIR  2009   inscional hernia repair x2-3  . UMBILICAL HERNIA REPAIR  Current Meds  Medication Sig  . albuterol (PROAIR HFA) 108 (90 Base) MCG/ACT inhaler Inhale 1 puff into the lungs every 6 (six) hours as needed for wheezing or shortness of breath.  [name] Kitchen amLODipine (NORVASC) 10 MG tablet Take 0.5 tablets (5 mg total) by mouth daily.  [name] Kitchen aspirin 81 MG tablet Take 2 tablets (162 mg total) by mouth daily.  . budesonide-formoterol (SYMBICORT) 160-4.5 MCG/ACT inhaler Inhale 2 puffs into the lungs daily.  . butalbital-acetaminophen-caffeine (FIORICET) 50-325-40 MG tablet   . furosemide (LASIX) 20 MG tablet Take 20 mg by mouth daily.   . hydrochlorothiazide (HYDRODIURIL) 25 MG tablet   .  isosorbide mononitrate (IMDUR) 30 MG 24 hr tablet TAKE (1) TABLET BY MOUTH ONCE DAILY.  . metoprolol tartrate (LOPRESSOR) 100 MG tablet Take 0.5 tablets (50 mg total) by mouth 2 (two) times daily.  . OXYGEN Inhale into the lungs.  . pantoprazole (PROTONIX) 40 MG tablet Take 1 tablet (40 mg total) by mouth daily.  . potassium chloride (K-DUR) 10 MEQ tablet TAKE ONE TABLET BY MOUTH TWICE DAILY.  [name] Kitchen predniSONE (DELTASONE) 10 MG tablet Take 1 tablet (10 mg total) by mouth daily with breakfast.  . promethazine (PHENERGAN) 25 MG tablet Take 25 mg by mouth every 6 (six) hours as needed for nausea or vomiting.  . vitamin B-12 (CYANOCOBALAMIN) 1000 MCG tablet Take 1 tablet (1,000 mcg total) by mouth daily.     Allergies:   Vancomycin cross reactors   Social History   Tobacco Use  . Smoking status: Never Smoker  . Smokeless tobacco: Never Used  Substance Use Topics  . Alcohol use: No    Alcohol/week: 0.0 standard drinks  . Drug use: No     Family Hx: The patient's family history includes Diabetes in her mother; Hyperlipidemia in her father; Hypertension in her mother. There is no history of Anesthesia problems, Hypotension, Pseudochol deficiency, or Malignant hyperthermia.  ROS:   Please see the history of present illness.     All other systems reviewed and are negative.   Prior CV studies:   The following studies were reviewed today:  Echo 11/19/17:  - Left ventricle: The cavity size was normal. Wall thickness was   increased in a pattern of mild LVH. Systolic function was normal.   The estimated ejection fraction was in the range of 60% to 65%.   Wall motion was normal; there were no regional wall motion   abnormalities. The study is not technically sufficient to allow   evaluation of LV diastolic function. - Aortic valve: Mildly calcified annulus. Trileaflet; normal   thickness leaflets. Valve area (VTI): 2.84 cm^2. Valve area   (Vmax): 2.36 cm^2. Valve area (Vmean): 2.43 cm^2. -  Technically difficult study. Echocontrast was used to enhance   visualization.   Labs/Other Tests and Data Reviewed:    EKG:  No ECG reviewed.  Recent Labs: No results found for requested labs within last 8760 hours.   Recent Lipid Panel Lab Results  Component Value Date/Time   CHOL 131 11/20/2017 06:09 AM   TRIG 139 11/20/2017 06:09 AM   HDL 45 11/20/2017 06:09 AM   CHOLHDL 2.9 11/20/2017 06:09 AM   LDLCALC 58 11/20/2017 06:09 AM    Wt Readings from Last 3 Encounters:  01/04/18 281 lb 1.4 oz (127.5 kg)  11/26/17 279 lb (126.6 kg)  11/19/17 270 lb (122.5 kg)     Objective:    Vital Signs:  BP 109/73   Pulse (!) 103  Ht 4' 10"  (1.473 m)   BMI 58.75 kg/m    VITAL SIGNS:  reviewed  ASSESSMENT & PLAN:    1.  PSVT: She complains of palpitations.  Currently on Lopressor 50 mg twice daily.  As blood pressure is low normal, I will stop amlodipine and increase Lopressor to 75 mg twice daily for symptom control.  2.  Chest pain: This is chronic but she denies any current chest pains.  She is on Lopressor 50 mg twice daily and Imdur 30 mg daily.  3.  Chronic bilateral leg edema: Likely due to morbid obesity and venous insufficiency.  She takes Lasix 20 mg daily and HCTZ 25 mg daily. She was previously evaluated by vascular surgery regarding her lower extremity edema and they said nothing could be done.  They offered compression stockings.  4. Thoracic aortic aneurysm: Supposedly followed by CT surgery but she does not recall any recent appointments. 4.6 cm by CT abd/pelvis on 01/04/18. I will make a referral to CT surgery.    COVID-19 Education: The signs and symptoms of COVID-19 were discussed with the patient and how to seek care for testing (follow up with PCP or arrange E-visit).  The importance of social distancing was discussed today.  Time:   Today, I have spent 15 minutes with the patient with telehealth technology discussing the above problems.     Medication  Adjustments/Labs and Tests Ordered: Current medicines are reviewed at length with the patient today.  Concerns regarding medicines are outlined above.   Tests Ordered: No orders of the defined types were placed in this encounter.   Medication Changes: No orders of the defined types were placed in this encounter.   Follow Up:  Virtual Visit  in 1 year(s)  Signed, Kate Sable, MD  07/09/2019 9:56 AM    [facility]

## 2019-07-09 NOTE — Addendum Note (Signed)
Addended by: Debbora Lacrosse R on: 07/09/2019 10:37 AM   Modules accepted: Orders

## 2019-07-10 ENCOUNTER — Telehealth: Payer: Self-pay | Admitting: Cardiovascular Disease

## 2019-07-10 MED ORDER — FUROSEMIDE 20 MG PO TABS: 20.0000 mg | ORAL_TABLET | Freq: Every day | ORAL | 11 refills | Status: AC

## 2019-07-10 MED ORDER — POTASSIUM CHLORIDE ER 10 MEQ PO TBCR: 10.0000 meq | EXTENDED_RELEASE_TABLET | Freq: Two times a day (BID) | ORAL | 11 refills | Status: AC

## 2019-07-10 MED ORDER — ISOSORBIDE MONONITRATE ER 30 MG PO TB24: ORAL_TABLET | ORAL | 11 refills | Status: AC

## 2019-07-10 MED ORDER — HYDROCHLOROTHIAZIDE 25 MG PO TABS: 25.0000 mg | ORAL_TABLET | Freq: Every day | ORAL | 11 refills | Status: AC

## 2019-07-10 NOTE — Telephone Encounter (Signed)
Pt's pharmacy needs to be switched to [facility] in [place] and all of her Rx's need to be sent there

## 2019-07-17 ENCOUNTER — Other Ambulatory Visit: Payer: Self-pay

## 2019-07-17 DIAGNOSIS — I712 Thoracic aortic aneurysm, without rupture, unspecified: Secondary | ICD-10-CM

## 2019-07-18 ENCOUNTER — Other Ambulatory Visit: Payer: Self-pay

## 2019-07-18 DIAGNOSIS — I712 Thoracic aortic aneurysm, without rupture, unspecified: Secondary | ICD-10-CM

## 2019-07-18 DIAGNOSIS — Z01812 Encounter for preprocedural laboratory examination: Secondary | ICD-10-CM

## 2019-08-01 ENCOUNTER — Ambulatory Visit (HOSPITAL_COMMUNITY): Admission: RE | Admit: 2019-08-01 | Payer: 59 | Source: Ambulatory Visit

## 2019-08-20 ENCOUNTER — Emergency Department (HOSPITAL_COMMUNITY): Payer: 59

## 2019-08-20 ENCOUNTER — Encounter (HOSPITAL_COMMUNITY): Payer: Self-pay

## 2019-08-20 ENCOUNTER — Inpatient Hospital Stay (HOSPITAL_COMMUNITY)
Admission: EM | Admit: 2019-08-20 | Discharge: 2019-09-19 | DRG: 291 | Disposition: E | Payer: 59 | Attending: Hospitalist | Admitting: Hospitalist

## 2019-08-20 ENCOUNTER — Other Ambulatory Visit: Payer: Self-pay

## 2019-08-20 ENCOUNTER — Encounter: Payer: 59 | Admitting: Cardiothoracic Surgery

## 2019-08-20 DIAGNOSIS — Z9981 Dependence on supplemental oxygen: Secondary | ICD-10-CM

## 2019-08-20 DIAGNOSIS — Z515 Encounter for palliative care: Secondary | ICD-10-CM | POA: Diagnosis not present

## 2019-08-20 DIAGNOSIS — J440 Chronic obstructive pulmonary disease with acute lower respiratory infection: Secondary | ICD-10-CM | POA: Diagnosis present

## 2019-08-20 DIAGNOSIS — Z8249 Family history of ischemic heart disease and other diseases of the circulatory system: Secondary | ICD-10-CM

## 2019-08-20 DIAGNOSIS — R0989 Other specified symptoms and signs involving the circulatory and respiratory systems: Secondary | ICD-10-CM

## 2019-08-20 DIAGNOSIS — I4891 Unspecified atrial fibrillation: Secondary | ICD-10-CM | POA: Diagnosis present

## 2019-08-20 DIAGNOSIS — J9601 Acute respiratory failure with hypoxia: Secondary | ICD-10-CM | POA: Diagnosis present

## 2019-08-20 DIAGNOSIS — J9622 Acute and chronic respiratory failure with hypercapnia: Secondary | ICD-10-CM | POA: Diagnosis present

## 2019-08-20 DIAGNOSIS — Z8349 Family history of other endocrine, nutritional and metabolic diseases: Secondary | ICD-10-CM

## 2019-08-20 DIAGNOSIS — Z79899 Other long term (current) drug therapy: Secondary | ICD-10-CM

## 2019-08-20 DIAGNOSIS — J9602 Acute respiratory failure with hypercapnia: Secondary | ICD-10-CM

## 2019-08-20 DIAGNOSIS — Z6841 Body Mass Index (BMI) 40.0 and over, adult: Secondary | ICD-10-CM

## 2019-08-20 DIAGNOSIS — J9621 Acute and chronic respiratory failure with hypoxia: Secondary | ICD-10-CM | POA: Diagnosis present

## 2019-08-20 DIAGNOSIS — I110 Hypertensive heart disease with heart failure: Principal | ICD-10-CM | POA: Diagnosis present

## 2019-08-20 DIAGNOSIS — Z5320 Procedure and treatment not carried out because of patient's decision for unspecified reasons: Secondary | ICD-10-CM | POA: Diagnosis present

## 2019-08-20 DIAGNOSIS — Z20828 Contact with and (suspected) exposure to other viral communicable diseases: Secondary | ICD-10-CM | POA: Diagnosis present

## 2019-08-20 DIAGNOSIS — Z66 Do not resuscitate: Secondary | ICD-10-CM | POA: Diagnosis present

## 2019-08-20 DIAGNOSIS — Z9119 Patient's noncompliance with other medical treatment and regimen: Secondary | ICD-10-CM

## 2019-08-20 DIAGNOSIS — D509 Iron deficiency anemia, unspecified: Secondary | ICD-10-CM | POA: Diagnosis present

## 2019-08-20 DIAGNOSIS — D539 Nutritional anemia, unspecified: Secondary | ICD-10-CM | POA: Diagnosis present

## 2019-08-20 DIAGNOSIS — Z833 Family history of diabetes mellitus: Secondary | ICD-10-CM

## 2019-08-20 DIAGNOSIS — R110 Nausea: Secondary | ICD-10-CM

## 2019-08-20 DIAGNOSIS — I5033 Acute on chronic diastolic (congestive) heart failure: Secondary | ICD-10-CM | POA: Diagnosis present

## 2019-08-20 DIAGNOSIS — Z23 Encounter for immunization: Secondary | ICD-10-CM

## 2019-08-20 DIAGNOSIS — Z7982 Long term (current) use of aspirin: Secondary | ICD-10-CM

## 2019-08-20 DIAGNOSIS — R609 Edema, unspecified: Secondary | ICD-10-CM

## 2019-08-20 DIAGNOSIS — I890 Lymphedema, not elsewhere classified: Secondary | ICD-10-CM | POA: Diagnosis present

## 2019-08-20 DIAGNOSIS — J441 Chronic obstructive pulmonary disease with (acute) exacerbation: Secondary | ICD-10-CM | POA: Diagnosis present

## 2019-08-20 DIAGNOSIS — Z8673 Personal history of transient ischemic attack (TIA), and cerebral infarction without residual deficits: Secondary | ICD-10-CM

## 2019-08-20 DIAGNOSIS — I10 Essential (primary) hypertension: Secondary | ICD-10-CM | POA: Diagnosis present

## 2019-08-20 DIAGNOSIS — R0902 Hypoxemia: Secondary | ICD-10-CM

## 2019-08-20 DIAGNOSIS — F40240 Claustrophobia: Secondary | ICD-10-CM | POA: Diagnosis present

## 2019-08-20 DIAGNOSIS — J129 Viral pneumonia, unspecified: Secondary | ICD-10-CM | POA: Diagnosis present

## 2019-08-20 DIAGNOSIS — I712 Thoracic aortic aneurysm, without rupture, unspecified: Secondary | ICD-10-CM | POA: Diagnosis present

## 2019-08-20 DIAGNOSIS — D696 Thrombocytopenia, unspecified: Secondary | ICD-10-CM | POA: Diagnosis present

## 2019-08-20 DIAGNOSIS — E872 Acidosis: Secondary | ICD-10-CM | POA: Diagnosis present

## 2019-08-20 DIAGNOSIS — J189 Pneumonia, unspecified organism: Secondary | ICD-10-CM

## 2019-08-20 DIAGNOSIS — K219 Gastro-esophageal reflux disease without esophagitis: Secondary | ICD-10-CM | POA: Diagnosis present

## 2019-08-20 DIAGNOSIS — Z7951 Long term (current) use of inhaled steroids: Secondary | ICD-10-CM

## 2019-08-20 DIAGNOSIS — R7989 Other specified abnormal findings of blood chemistry: Secondary | ICD-10-CM

## 2019-08-20 DIAGNOSIS — F411 Generalized anxiety disorder: Secondary | ICD-10-CM | POA: Diagnosis present

## 2019-08-20 DIAGNOSIS — G4733 Obstructive sleep apnea (adult) (pediatric): Secondary | ICD-10-CM | POA: Diagnosis present

## 2019-08-20 DIAGNOSIS — G931 Anoxic brain damage, not elsewhere classified: Secondary | ICD-10-CM | POA: Diagnosis not present

## 2019-08-20 DIAGNOSIS — R0600 Dyspnea, unspecified: Secondary | ICD-10-CM

## 2019-08-20 LAB — CBC WITH DIFFERENTIAL/PLATELET
Abs Immature Granulocytes: 0.02 10*3/uL (ref 0.00–0.07)
Basophils Absolute: 0 10*3/uL (ref 0.0–0.1)
Basophils Relative: 1 %
Eosinophils Absolute: 0 10*3/uL (ref 0.0–0.5)
Eosinophils Relative: 1 %
HCT: 43.5 % (ref 36.0–46.0)
Hemoglobin: 11.9 g/dL — ABNORMAL LOW (ref 12.0–15.0)
Immature Granulocytes: 0 %
Lymphocytes Relative: 17 %
Lymphs Abs: 1.1 10*3/uL (ref 0.7–4.0)
MCH: 31.3 pg (ref 26.0–34.0)
MCHC: 27.4 g/dL — ABNORMAL LOW (ref 30.0–36.0)
MCV: 114.5 fL — ABNORMAL HIGH (ref 80.0–100.0)
Monocytes Absolute: 0.5 10*3/uL (ref 0.1–1.0)
Monocytes Relative: 8 %
Neutro Abs: 4.8 10*3/uL (ref 1.7–7.7)
Neutrophils Relative %: 73 %
Platelets: 118 10*3/uL — ABNORMAL LOW (ref 150–400)
RBC: 3.8 MIL/uL — ABNORMAL LOW (ref 3.87–5.11)
RDW: 15.4 % (ref 11.5–15.5)
WBC: 6.5 10*3/uL (ref 4.0–10.5)
nRBC: 0 % (ref 0.0–0.2)

## 2019-08-20 LAB — TROPONIN I (HIGH SENSITIVITY)
Troponin I (High Sensitivity): 6 ng/L (ref <18)
Troponin I (High Sensitivity): 8 ng/L (ref <18)

## 2019-08-20 LAB — POC OCCULT BLOOD, ED: Fecal Occult Bld: NEGATIVE

## 2019-08-20 LAB — BLOOD GAS, ARTERIAL
Acid-Base Excess: 14.3 mmol/L — ABNORMAL HIGH (ref 0.0–2.0)
Bicarbonate: 34.9 mmol/L — ABNORMAL HIGH (ref 20.0–28.0)
FIO2: 44
O2 Saturation: 85 %
Patient temperature: 36.9
pCO2 arterial: 92.1 mmHg (ref 32.0–48.0)
pH, Arterial: 7.276 — ABNORMAL LOW (ref 7.350–7.450)
pO2, Arterial: 55 mmHg — ABNORMAL LOW (ref 83.0–108.0)

## 2019-08-20 LAB — BASIC METABOLIC PANEL
Anion gap: 10 (ref 5–15)
BUN: 20 mg/dL (ref 8–23)
CO2: 35 mmol/L — ABNORMAL HIGH (ref 22–32)
Calcium: 9.2 mg/dL (ref 8.9–10.3)
Chloride: 99 mmol/L (ref 98–111)
Creatinine, Ser: 0.95 mg/dL (ref 0.44–1.00)
GFR calc Af Amer: >60 mL/min (ref >60)
GFR calc non Af Amer: >60 mL/min (ref >60)
Glucose, Bld: 115 mg/dL — ABNORMAL HIGH (ref 70–99)
Potassium: 5.2 mmol/L — ABNORMAL HIGH (ref 3.5–5.1)
Sodium: 144 mmol/L (ref 135–145)

## 2019-08-20 LAB — LACTIC ACID, PLASMA
Lactic Acid, Venous: 1.3 mmol/L (ref 0.5–1.9)
Lactic Acid, Venous: 0.9 mmol/L (ref 0.5–1.9)

## 2019-08-20 LAB — D-DIMER, QUANTITATIVE: D-Dimer, Quant: 2.99 ug/mL-FEU — ABNORMAL HIGH (ref 0.00–0.50)

## 2019-08-20 LAB — APTT: aPTT: 33 seconds (ref 24–36)

## 2019-08-20 LAB — POC SARS CORONAVIRUS 2 AG -  ED: SARS Coronavirus 2 Ag: NEGATIVE

## 2019-08-20 LAB — BRAIN NATRIURETIC PEPTIDE: B Natriuretic Peptide: 255 pg/mL — ABNORMAL HIGH (ref 0.0–100.0)

## 2019-08-20 LAB — HEPARIN LEVEL (UNFRACTIONATED): Heparin Unfractionated: <0.1 IU/mL — ABNORMAL LOW (ref 0.30–0.70)

## 2019-08-20 LAB — SARS CORONAVIRUS 2 BY RT PCR (HOSPITAL ORDER, PERFORMED IN ~~LOC~~ HOSPITAL LAB): SARS Coronavirus 2: NEGATIVE

## 2019-08-20 MED ORDER — ALBUTEROL (5 MG/ML) CONTINUOUS INHALATION SOLN
INHALATION_SOLUTION | RESPIRATORY_TRACT | Status: AC
  Administered 2019-08-20: 10 mg via RESPIRATORY_TRACT
  Filled 2019-08-20: qty 20

## 2019-08-20 MED ORDER — DOXYCYCLINE HYCLATE 100 MG PO TABS
100.0000 mg | ORAL_TABLET | Freq: Once | ORAL | Status: AC
  Administered 2019-08-20: 100 mg via ORAL
  Filled 2019-08-20: qty 1

## 2019-08-20 MED ORDER — IOHEXOL 350 MG/ML SOLN
100.0000 mL | Freq: Once | INTRAVENOUS | Status: AC | PRN
  Administered 2019-08-23: 100 mL via INTRAVENOUS

## 2019-08-20 MED ORDER — IBUPROFEN 400 MG PO TABS
400.0000 mg | ORAL_TABLET | Freq: Once | ORAL | Status: AC
  Administered 2019-08-20: 400 mg via ORAL
  Filled 2019-08-20: qty 1

## 2019-08-20 MED ORDER — ALBUTEROL (5 MG/ML) CONTINUOUS INHALATION SOLN
10.0000 mg/h | INHALATION_SOLUTION | RESPIRATORY_TRACT | Status: DC
  Filled 2019-08-20: qty 20

## 2019-08-20 MED ORDER — IPRATROPIUM BROMIDE 0.02 % IN SOLN
RESPIRATORY_TRACT | Status: AC
  Administered 2019-08-20: 0.5 mg
  Filled 2019-08-20: qty 2.5

## 2019-08-20 MED ORDER — ALBUTEROL SULFATE HFA 108 (90 BASE) MCG/ACT IN AERS
4.0000 | INHALATION_SPRAY | Freq: Once | RESPIRATORY_TRACT | Status: DC
  Filled 2019-08-20: qty 6.7

## 2019-08-20 MED ORDER — IPRATROPIUM BROMIDE 0.02 % IN SOLN: 0.5000 mg | Freq: Once | RESPIRATORY_TRACT | Status: DC

## 2019-08-20 MED ORDER — IPRATROPIUM BROMIDE HFA 17 MCG/ACT IN AERS
4.0000 | INHALATION_SPRAY | Freq: Once | RESPIRATORY_TRACT | Status: DC
  Filled 2019-08-20: qty 12.9

## 2019-08-20 MED ORDER — METHYLPREDNISOLONE SODIUM SUCC 125 MG IJ SOLR
125.0000 mg | Freq: Once | INTRAMUSCULAR | Status: AC
  Administered 2019-08-20: 125 mg via INTRAVENOUS
  Filled 2019-08-20: qty 2

## 2019-08-20 MED ORDER — HEPARIN BOLUS VIA INFUSION
4000.0000 [IU] | Freq: Once | INTRAVENOUS | Status: AC
  Administered 2019-08-21: 4000 [IU] via INTRAVENOUS

## 2019-08-20 MED ORDER — MAGNESIUM SULFATE 2 GM/50ML IV SOLN
2.0000 g | Freq: Once | INTRAVENOUS | Status: AC
  Administered 2019-08-20: 2 g via INTRAVENOUS
  Filled 2019-08-20: qty 50

## 2019-08-20 MED ORDER — FUROSEMIDE 10 MG/ML IJ SOLN
40.0000 mg | Freq: Once | INTRAMUSCULAR | Status: AC
  Administered 2019-08-20: 40 mg via INTRAVENOUS
  Filled 2019-08-20: qty 4

## 2019-08-20 MED ORDER — ALBUTEROL (5 MG/ML) CONTINUOUS INHALATION SOLN
INHALATION_SOLUTION | RESPIRATORY_TRACT | Status: AC
  Administered 2019-08-20: 20:00:00
  Filled 2019-08-20: qty 20

## 2019-08-20 MED ORDER — HEPARIN (PORCINE) 25000 UT/250ML-% IV SOLN
1100.0000 [IU]/h | INTRAVENOUS | Status: DC
  Administered 2019-08-21 (×2): 1400 [IU]/h via INTRAVENOUS
  Administered 2019-08-22: 1200 [IU]/h via INTRAVENOUS
  Administered 2019-08-23: 1100 [IU]/h via INTRAVENOUS
  Filled 2019-08-20 (×4): qty 250

## 2019-08-20 NOTE — ED Notes (Signed)
Pt thoroughly expressed that she does not want to be put on the bipap. Pt does not want to be intubated. Pt does not want CPR.  Pt wants Oxygen only. EDP and RT in the room with RN when pt expressed wishes.

## 2019-08-20 NOTE — ED Notes (Signed)
Denies chest pain

## 2019-08-20 NOTE — ED Triage Notes (Signed)
Ems brought in due to CP and SOB. SOB all day. CP started approx at 1430. Pt given one nitro and already had asa 324 mg. Pt wears O2 at 2 L viz [place]. EMS reports  sats 70's with 2 L via [place]. Placed 15 L NRB with sats up to 96 %

## 2019-08-20 NOTE — ED Notes (Signed)
Family in room wanting update on patient

## 2019-08-20 NOTE — ED Notes (Signed)
Patient sat dropped down to 77%. EDP and PA to room. Pt placed on Non-rebreather. Back up to 92%

## 2019-08-20 NOTE — Progress Notes (Signed)
Patient had a desat episode and was placed on a non rebreather mask to bring sat up. Placed patient back on HFNC at 15 lpm after her saturation came back to 100% and started her on another CAT. Patient still addiment about not going on the bipap and is now a dnr. Daughter at bedside.

## 2019-08-20 NOTE — ED Provider Notes (Signed)
[facility] EMERGENCY DEPARTMENT Provider Note   CSN: [id number] Arrival date & time: 08/22/2019  1639     History   Chief Complaint Chief Complaint  Patient presents with  . Shortness of Breath  . Chest Pain    HPI Sabrina Curry is a 68 y.o. female.     HPI   68 year old female, morbidly obese, with a PMH of COPD, HTN, thoracic aortic aneurysm, presents with shortness of breath that started this morning.  Patient states symptoms have been gradually worsening over the day.  She denies any cough, fevers, chills.  She denies any known sick contacts, exposure to COVID-19.  Per EMS on their arrival patient was satting in the 70s on her home 2 L and was placed on 15 L nonrebreather.  Patient denies a PMH of CHF, A. fib.  Patient does have peripheral edema and states that she has had it for the last month.  She denies any nausea, vomiting, abdominal pain.  Patient did have chest pain earlier today and was given 1 nitro by EMS.  Past Medical History:  Diagnosis Date  . Anxiety   . Arthritis   . Asthma   . Chronic back pain   . COPD (chronic obstructive pulmonary disease) ([place])   . Edema of both legs    chronic  . GERD (gastroesophageal reflux disease)   . H/O dizziness   . H/O shortness of breath   . Headache(784.0)   . Hx of migraines   . Hypertension   . Knee pain, bilateral   . Obstructive sleep apnea    On CPAP by nasal prong  . Palpitations 12/2012   ? SVT  . Seasonal allergies   . Stroke [facility])    last stoke Oct 2016  . Thoracic aortic aneurysm [facility]) 12/2012    12/2012: 4.3 cm fusiform aneurysm-ascending thoracic aorta  . Varicose veins of both legs with edema     Patient Active Problem List   Diagnosis Date Noted  . 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 Intractable epigastric abdominal pain 01/04/2018  . COPD with acute exacerbation ([place]) 11/20/2017  . Morbid obesity with BMI of 50.0-59.9, adult ([place]) 11/20/2017  . Chest pain at rest 11/19/2017  .  Varicose veins of bilateral lower extremities with other complications 40/98/1191  . Swelling of limb 10/10/2016  . Spider veins of both lower extremities 10/10/2016  . COPD exacerbation ([place]) 02/06/2016  . Acute respiratory failure with hypoxia ([place]) 02/06/2016  . Hypoxia 02/06/2016  . COPD (chronic obstructive pulmonary disease) ([place])   . Chronic back pain   . Chronic obstructive pulmonary disease ([place])   . Chest pain 06/24/2015  . TIA (transient ischemic attack) 06/24/2015  . UTI (lower urinary tract infection) 06/24/2015  . Difficulty speaking   . Slurred speech   . Skin eruption 01/30/2013  . Obstructive sleep apnea   . Thoracic aortic aneurysm ([place]) 12/17/2012  . Paroxysmal supraventricular tachycardia-nonsustained 12/08/2012  . Morbid obesity ([place]) 12/08/2012  . Asthma, chronic 12/08/2012  . Hypertension 12/08/2012    Past Surgical History:  Procedure Laterality Date  . ABDOMINAL HYSTERECTOMY    . APPENDECTOMY     with gallbladder removal  . BACK SURGERY     had staff infection of back  . CATARACT EXTRACTION W/PHACO  01/16/2012   Procedure: CATARACT EXTRACTION PHACO AND INTRAOCULAR LENS PLACEMENT (IOC);  Surgeon: Elta Guadeloupe T. Gershon Crane, MD;  Location: [facility];  Service: Ophthalmology;  Laterality: Right;  CDE 6.12  .  CHOLECYSTECTOMY     [facility]  . HERNIA REPAIR  2009   inscional hernia repair x2-3  . UMBILICAL HERNIA REPAIR       OB History    Gravida      Para      Term      Preterm      AB      Living  3     SAB      TAB      Ectopic      Multiple      Live Births               Home Medications    Prior to Admission medications   Medication Sig Start Date End Date Taking? Authorizing Provider  albuterol (PROAIR HFA) 108 (90 Base) MCG/ACT inhaler Inhale 1 puff into the lungs every 6 (six) hours as needed for wheezing or shortness of breath.    [provider]  aspirin 81 MG tablet Take 2 tablets (162 mg total) by mouth daily.  06/25/15   Kathie Dike, MD  budesonide-formoterol (SYMBICORT) 160-4.5 MCG/ACT inhaler Inhale 2 puffs into the lungs daily.    [provider]  butalbital-acetaminophen-caffeine (FIORICET) [phone] MG tablet  05/22/19   [provider]  furosemide (LASIX) 20 MG tablet Take 1 tablet (20 mg total) by mouth daily. 07/10/19   Herminio Commons, MD  hydrochlorothiazide (HYDRODIURIL) 25 MG tablet Take 1 tablet (25 mg total) by mouth daily. 07/10/19   Herminio Commons, MD  isosorbide mononitrate (IMDUR) 30 MG 24 hr tablet TAKE (1) TABLET BY MOUTH ONCE DAILY. 07/10/19   Herminio Commons, MD  metoprolol tartrate (LOPRESSOR) 50 MG tablet Take 1.5 tablets (75 mg total) by mouth 2 (two) times daily. 07/09/19 10/07/19  Herminio Commons, MD  OXYGEN Inhale into the lungs.    [provider]  pantoprazole (PROTONIX) 40 MG tablet Take 1 tablet (40 mg total) by mouth daily. 06/25/15   Kathie Dike, MD  potassium chloride (KLOR-CON) 10 MEQ tablet Take 1 tablet (10 mEq total) by mouth 2 (two) times daily. 07/10/19   Herminio Commons, MD  predniSONE (DELTASONE) 10 MG tablet Take 1 tablet (10 mg total) by mouth daily with breakfast. 01/07/18   Isaac Bliss, Rayford Halsted, MD  promethazine (PHENERGAN) 25 MG tablet Take 25 mg by mouth every 6 (six) hours as needed for nausea or vomiting.    [provider]  vitamin B-12 (CYANOCOBALAMIN) 1000 MCG tablet Take 1 tablet (1,000 mcg total) by mouth daily. 06/25/15   Kathie Dike, MD    Family History Family History  Problem Relation Age of Onset  . Hypertension Mother   . Diabetes Mother   . Hyperlipidemia Father        Also hypertension and an aneurysm  . Anesthesia problems Neg Hx   . Hypotension Neg Hx   . Pseudochol deficiency Neg Hx   . Malignant hyperthermia Neg Hx     Social History Social History   Tobacco Use  . Smoking status: Never Smoker  . Smokeless tobacco: Never Used  Substance Use Topics   . Alcohol use: No    Alcohol/week: 0.0 standard drinks  . Drug use: No     Allergies   Vancomycin cross reactors   Review of Systems Review of Systems  Constitutional: Negative for chills and fever.  HENT: Negative for rhinorrhea and sore throat.   Eyes: Negative for visual disturbance.  Respiratory: Positive for shortness of  breath. Negative for cough.   Cardiovascular: Positive for chest pain. Negative for leg swelling.  Gastrointestinal: Negative for abdominal pain, diarrhea, nausea and vomiting.  Genitourinary: Negative for dysuria, frequency and urgency.  Skin: Negative for wound.  All other systems reviewed and are negative.    Physical Exam Updated Vital Signs BP (!) 131/93   Pulse 85   Temp 97.9 F (36.6 C) (Rectal)   Resp (!) 25   Wt (!) 148.4 kg   SpO2 99%   BMI 68.38 kg/m   Physical Exam Vitals signs and nursing note reviewed.  Constitutional:      Appearance: She is well-developed. She is obese.  HENT:     Head: Normocephalic and atraumatic.  Eyes:     Conjunctiva/sclera: Conjunctivae normal.  Neck:     Musculoskeletal: Neck supple.  Cardiovascular:     Rate and Rhythm: Tachycardia present. Rhythm irregular.     Heart sounds: Normal heart sounds. No murmur.  Pulmonary:     Effort: Tachypnea present.     Breath sounds: Examination of the right-upper field reveals rales. Examination of the left-upper field reveals rales. Examination of the right-middle field reveals rales. Examination of the left-middle field reveals rales. Examination of the right-lower field reveals rales. Examination of the left-lower field reveals rales. Rales present. No wheezing.  Abdominal:     General: Bowel sounds are normal. There is no distension.     Palpations: Abdomen is soft.     Tenderness: There is no abdominal tenderness.  Musculoskeletal: Normal range of motion.        General: No tenderness or deformity.     Right lower leg: 3+ Edema present.     Left lower  leg: 3+ Edema present.  Skin:    General: Skin is warm and dry.     Findings: No erythema or rash.  Neurological:     Mental Status: She is alert and oriented to person, place, and time.  Psychiatric:        Behavior: Behavior normal.      ED Treatments / Results  Labs (all labs ordered are listed, but only abnormal results are displayed) Labs Reviewed  BASIC METABOLIC PANEL  BRAIN NATRIURETIC PEPTIDE  BLOOD GAS, ARTERIAL  POC SARS CORONAVIRUS 2 AG -  ED    EKG EKG Interpretation  Date/Time:  Wednesday August 20 2019 17:07:02 EST Ventricular Rate:  99 PR Interval:    QRS Duration: 81 QT Interval:  475 QTC Calculation: 628 R Axis:   62 Text Interpretation: Atrial fibrillation Ventricular premature complex Aberrant conduction of SV complex(es) Low voltage, precordial leads Anteroseptal infarct, old Nonspecific T abnormalities, lateral leads Prolonged QT interval Baseline wander in lead(s) V1 now atrial fibrillation Confirmed by Ezequiel Essex [phone]) on 09/16/2019 5:10:08 PM   Radiology No results found.  Procedures .Critical Care Performed by: Etter Sjogren, PA-C Authorized by: Etter Sjogren, PA-C   Critical care provider statement:    Critical care time (minutes):  45   Critical care time was exclusive of:  Separately billable procedures and treating other patients   Critical care was necessary to treat or prevent imminent or life-threatening deterioration of the following conditions:  Respiratory failure, shock, sepsis and circulatory failure   Critical care was time spent personally by me on the following activities:  Discussions with consultants, evaluation of patient's response to treatment, examination of patient, ordering and performing treatments and interventions, ordering and review of laboratory studies, ordering and review of radiographic studies,  pulse oximetry, re-evaluation of patient's condition, obtaining history from patient or surrogate  and review of old charts   (including critical care time)  Medications Ordered in ED Medications  furosemide (LASIX) injection 40 mg (has no administration in time range)     Initial Impression / Assessment and Plan / ED Course  I have reviewed the triage vital signs and the nursing notes.  Pertinent labs & imaging results that were available during my care of the patient were reviewed by me and considered in my medical decision making (see chart for details).       7:57 PM Patient has had 2 COVID-19 swabs both of which are negative.  We attempted to place the patient on BiPAP and she is refusing stating that she is claustrophobic.  We discussed that she might have to be intubated if she continues to decline on high flow nasal cannula.  Patient states she does not wish to be intubated and does not wish to have CPR.  She states she is DNR.  I have called and spoken with patient's daughter, [name].  She confirms that patient wishes to be DNR.  Daughter also expresses understanding that patient is refusing BiPAP.  Patient complaining of a headache and is requesting ibuprofen. D-dimer is elevated, CT angios ordered for PE rule out.   11:02 PM Patient continues to refuse BiPAP.  Her oxygen remains low.  Another continuous neb and Atrovent was ordered.  Magnesium was ordered.  Patient responded well to first dose of Lasix, repeat Lasix ordered.  CT angio was unable to be obtained due to patient stating she cannot lie flat.  Her Hemoccult was negative, will start on heparin.  Patient agreeable to trying CT angio tomorrow.     Sabrina Curry was evaluated in Emergency Department on 08/28/2019 for the symptoms described in the history of present illness. She was evaluated in the context of the global COVID-19 pandemic, which necessitated consideration that the patient might be at risk for infection with the SARS-CoV-2 virus that causes COVID-19. Institutional protocols and algorithms that pertain to  the evaluation of patients at risk for COVID-19 are in a state of rapid change based on information released by regulatory bodies including the CDC and federal and state organizations. These policies and algorithms were followed during the patient's care in the ED.    MDM Patient presented with shortness of breath.  Symptoms significantly worsened over the course the day.  On my initial exam she had rales throughout.  She was slightly diminished.  No audible wheezing heard.  Lasix started although patient denied a history of CHF.  Patient was noted to be in A. fib and again denied any history of this.  Patient met SIRS criteria but was afebrile, denied cough and fevers at home.  Did not start a full code sepsis on her as patient is likely not infectious.  Patient noted to be hypoxic on pulse ox.  ABG obtained which showed hypoxemia and hypercapnic.  She received a rapid COVID-19 swab which was negative and a repeat PCR Covid swab which was negative.  I attempted to put the patient on BiPAP and she refused.  I asked for a Ventimask and is informed that if Ventimask is not available at [facility].  Patient was placed on high flow nasal cannula.   Patient had a chest x-ray which showed vascular congestion possible atypical pneumonia.  Patient's D-dimer and BNP is also noted to be elevated.  Attempted to obtain a  CT to further evaluate patient's chest and was unable due to lack of patient cooperation.  Patient given doxycycline to cover for atypical pneumonia.  I have discussed case with attending, Dr. Wyvonnia Dusky in detail and he is agreeable with plan.  I have discussed with patient and her daughter at bedside.  They are both agreeable with plan.  They understand patient is very sick and has a life-threatening medical condition.  She is still refusing BiPAP.  She still wishes to be DNR.  Patient will need admission to the hospital.  Spoke with hospitalist, Dr. Scherrie November, who will admit the patient.  Final  Clinical Impressions(s) / ED Diagnoses   Final diagnoses:  None    ED Discharge Orders    None       Rachel Moulds 08/21/19 0111    Ezequiel Essex, MD 08/22/19 [phone]

## 2019-08-20 NOTE — Progress Notes (Signed)
Attempted to place patient on BIPAP.  Patient was only able to wear about 2-3 mins. Patient stated she is very claustrophobic and did not want to wear the mask. Discussed situation with patient and her increased CO2 levels and that she may need intubation if her condition worsens, but she then stated that she did not want life support or a vent either. MD and RN brought in room to witness discussion and talk with patient further. MD to call patients daughter and inform her of her mothers wishes. Will continue to monitor at this time. BIPAP still at bedside if needed.

## 2019-08-20 NOTE — Progress Notes (Signed)
Pt placed on high flow nasal cannula per MD.  Awaiting negative pressure room placement for BIPAP.  RT will continue to monitor.

## 2019-08-20 NOTE — ED Notes (Signed)
Pt changed and purwick repositioned

## 2019-08-20 NOTE — ED Notes (Signed)
Pt still declines bipap despite low saturations.

## 2019-08-20 NOTE — Progress Notes (Signed)
ANTICOAGULATION CONSULT NOTE - Initial Consult  Pharmacy Consult for heparin infusion Indication: pulmonary embolus  Allergies  Allergen Reactions  . Vancomycin Cross Reactors Anaphylaxis    Patient Measurements: Weight: (!) 327 lb 3.2 oz (148.4 kg) Heparin Dosing Weight:     HEPARIN DW (KG): 80.3  Vital Signs: Temp: 97.9 F (36.6 C) (12/02 1700) Temp Source: Rectal (12/02 1700) BP: 133/69 (12/02 2233) Pulse Rate: 99 (12/02 2242)  Labs: Recent Labs    09/17/2019 1907 09/10/2019 2119  HGB 11.9*  --   HCT 43.5  --   PLT 118*  --   CREATININE 0.95  --   TROPONINIHS 6 8    Estimated Creatinine Clearance: 77.2 mL/min (by C-G formula based on SCr of 0.95 mg/dL).   Medical History: Past Medical History:  Diagnosis Date  . Anxiety   . Arthritis   . Asthma   . Chronic back pain   . COPD (chronic obstructive pulmonary disease) ([place])   . Edema of both legs    chronic  . GERD (gastroesophageal reflux disease)   . H/O dizziness   . H/O shortness of breath   . Headache(784.0)   . Hx of migraines   . Hypertension   . Knee pain, bilateral   . Obstructive sleep apnea    On CPAP by nasal prong  . Palpitations 12/2012   ? SVT  . Seasonal allergies   . Stroke [facility])    last stoke Oct 2016  . Thoracic aortic aneurysm [facility]) 12/2012    12/2012: 4.3 cm fusiform aneurysm-ascending thoracic aorta  . Varicose veins of both legs with edema       Assessment: Pharmacy consulted to dose heparin for this 68 yo female with possible PE. Her D-Dimer is elevated at 2.99ug/mL-FEU.  She wasn't taking any prior anti-coagulants prior to admission.  Will need to monitor platelets carefully, as they are low normal at 118K.  Goal of Therapy:  Heparin level 0.3-0.7 units/ml Monitor platelets by anticoagulation protocol: Yes   Plan:  Give 4000 units bolus x 1 Start heparin infusion at 1400 units/hr Check anti-Xa level in 6-8 hours and daily while on heparin Continue to monitor H&H and  platelets  Despina Pole 08/19/2019,10:45 PM

## 2019-08-20 NOTE — ED Notes (Signed)
Removed non rebreather to trial O2 cannula at 6L via [place] sats 93 %. Provider at bedside

## 2019-08-21 ENCOUNTER — Inpatient Hospital Stay (HOSPITAL_COMMUNITY): Payer: 59

## 2019-08-21 DIAGNOSIS — J129 Viral pneumonia, unspecified: Secondary | ICD-10-CM | POA: Diagnosis present

## 2019-08-21 DIAGNOSIS — J441 Chronic obstructive pulmonary disease with (acute) exacerbation: Secondary | ICD-10-CM

## 2019-08-21 DIAGNOSIS — J449 Chronic obstructive pulmonary disease, unspecified: Secondary | ICD-10-CM | POA: Diagnosis not present

## 2019-08-21 DIAGNOSIS — I712 Thoracic aortic aneurysm, without rupture: Secondary | ICD-10-CM | POA: Diagnosis present

## 2019-08-21 DIAGNOSIS — Z9981 Dependence on supplemental oxygen: Secondary | ICD-10-CM | POA: Diagnosis not present

## 2019-08-21 DIAGNOSIS — R092 Respiratory arrest: Secondary | ICD-10-CM | POA: Diagnosis not present

## 2019-08-21 DIAGNOSIS — J9602 Acute respiratory failure with hypercapnia: Secondary | ICD-10-CM | POA: Diagnosis not present

## 2019-08-21 DIAGNOSIS — Z20828 Contact with and (suspected) exposure to other viral communicable diseases: Secondary | ICD-10-CM | POA: Diagnosis present

## 2019-08-21 DIAGNOSIS — I4891 Unspecified atrial fibrillation: Secondary | ICD-10-CM | POA: Diagnosis present

## 2019-08-21 DIAGNOSIS — I110 Hypertensive heart disease with heart failure: Secondary | ICD-10-CM | POA: Diagnosis present

## 2019-08-21 DIAGNOSIS — Z5320 Procedure and treatment not carried out because of patient's decision for unspecified reasons: Secondary | ICD-10-CM | POA: Diagnosis present

## 2019-08-21 DIAGNOSIS — J9621 Acute and chronic respiratory failure with hypoxia: Secondary | ICD-10-CM | POA: Diagnosis present

## 2019-08-21 DIAGNOSIS — Z6841 Body Mass Index (BMI) 40.0 and over, adult: Secondary | ICD-10-CM | POA: Diagnosis not present

## 2019-08-21 DIAGNOSIS — J440 Chronic obstructive pulmonary disease with acute lower respiratory infection: Secondary | ICD-10-CM | POA: Diagnosis present

## 2019-08-21 DIAGNOSIS — E872 Acidosis: Secondary | ICD-10-CM | POA: Diagnosis present

## 2019-08-21 DIAGNOSIS — G4733 Obstructive sleep apnea (adult) (pediatric): Secondary | ICD-10-CM | POA: Diagnosis present

## 2019-08-21 DIAGNOSIS — I5033 Acute on chronic diastolic (congestive) heart failure: Secondary | ICD-10-CM | POA: Diagnosis present

## 2019-08-21 DIAGNOSIS — J9601 Acute respiratory failure with hypoxia: Secondary | ICD-10-CM | POA: Diagnosis not present

## 2019-08-21 DIAGNOSIS — J189 Pneumonia, unspecified organism: Secondary | ICD-10-CM | POA: Diagnosis not present

## 2019-08-21 DIAGNOSIS — D539 Nutritional anemia, unspecified: Secondary | ICD-10-CM | POA: Diagnosis present

## 2019-08-21 DIAGNOSIS — Z66 Do not resuscitate: Secondary | ICD-10-CM | POA: Diagnosis present

## 2019-08-21 DIAGNOSIS — G931 Anoxic brain damage, not elsewhere classified: Secondary | ICD-10-CM | POA: Diagnosis not present

## 2019-08-21 DIAGNOSIS — D696 Thrombocytopenia, unspecified: Secondary | ICD-10-CM | POA: Diagnosis present

## 2019-08-21 DIAGNOSIS — Z515 Encounter for palliative care: Secondary | ICD-10-CM | POA: Diagnosis not present

## 2019-08-21 DIAGNOSIS — Z23 Encounter for immunization: Secondary | ICD-10-CM | POA: Diagnosis present

## 2019-08-21 DIAGNOSIS — J9622 Acute and chronic respiratory failure with hypercapnia: Secondary | ICD-10-CM | POA: Diagnosis present

## 2019-08-21 DIAGNOSIS — F411 Generalized anxiety disorder: Secondary | ICD-10-CM | POA: Diagnosis present

## 2019-08-21 DIAGNOSIS — K219 Gastro-esophageal reflux disease without esophagitis: Secondary | ICD-10-CM | POA: Diagnosis present

## 2019-08-21 LAB — BASIC METABOLIC PANEL
Anion gap: 10 (ref 5–15)
BUN: 21 mg/dL (ref 8–23)
CO2: 40 mmol/L — ABNORMAL HIGH (ref 22–32)
Calcium: 8.9 mg/dL (ref 8.9–10.3)
Chloride: 94 mmol/L — ABNORMAL LOW (ref 98–111)
Creatinine, Ser: 1.09 mg/dL — ABNORMAL HIGH (ref 0.44–1.00)
GFR calc Af Amer: >60 mL/min (ref >60)
GFR calc non Af Amer: 53 mL/min — ABNORMAL LOW (ref >60)
Glucose, Bld: 139 mg/dL — ABNORMAL HIGH (ref 70–99)
Potassium: 4.4 mmol/L (ref 3.5–5.1)
Sodium: 144 mmol/L (ref 135–145)

## 2019-08-21 LAB — CBC
HCT: 39.2 % (ref 36.0–46.0)
Hemoglobin: 11.1 g/dL — ABNORMAL LOW (ref 12.0–15.0)
MCH: 31.5 pg (ref 26.0–34.0)
MCHC: 28.3 g/dL — ABNORMAL LOW (ref 30.0–36.0)
MCV: 111.4 fL — ABNORMAL HIGH (ref 80.0–100.0)
Platelets: 124 10*3/uL — ABNORMAL LOW (ref 150–400)
RBC: 3.52 MIL/uL — ABNORMAL LOW (ref 3.87–5.11)
RDW: 15.3 % (ref 11.5–15.5)
WBC: 8.8 10*3/uL (ref 4.0–10.5)
nRBC: 0 % (ref 0.0–0.2)

## 2019-08-21 LAB — FERRITIN: Ferritin: 31 ng/mL (ref 11–307)

## 2019-08-21 LAB — HIV ANTIBODY (ROUTINE TESTING W REFLEX): HIV Screen 4th Generation wRfx: NONREACTIVE

## 2019-08-21 LAB — TSH: TSH: 1.299 u[IU]/mL (ref 0.350–4.500)

## 2019-08-21 LAB — IRON AND TIBC
Iron: 27 ug/dL — ABNORMAL LOW (ref 28–170)
Saturation Ratios: 7 % — ABNORMAL LOW (ref 10.4–31.8)
TIBC: 405 ug/dL (ref 250–450)
UIBC: 378 ug/dL

## 2019-08-21 LAB — TROPONIN I (HIGH SENSITIVITY)
Troponin I (High Sensitivity): 9 ng/L (ref <18)
Troponin I (High Sensitivity): 9 ng/L (ref <18)
Troponin I (High Sensitivity): 10 ng/L (ref <18)

## 2019-08-21 LAB — ECHOCARDIOGRAM COMPLETE
Height: 57.992 in
Weight: 5104.09 oz

## 2019-08-21 LAB — RETICULOCYTES
Immature Retic Fract: 18.4 % — ABNORMAL HIGH (ref 2.3–15.9)
RBC.: 3.51 MIL/uL — ABNORMAL LOW (ref 3.87–5.11)
Retic Count, Absolute: 100 10*3/uL (ref 19.0–186.0)
Retic Ct Pct: 2.9 % (ref 0.4–3.1)

## 2019-08-21 LAB — MRSA PCR SCREENING: MRSA by PCR: POSITIVE — AB

## 2019-08-21 LAB — HEPARIN LEVEL (UNFRACTIONATED)
Heparin Unfractionated: 0.62 IU/mL (ref 0.30–0.70)
Heparin Unfractionated: 0.7 IU/mL (ref 0.30–0.70)

## 2019-08-21 LAB — FOLATE: Folate: 23.2 ng/mL (ref >5.9)

## 2019-08-21 LAB — VITAMIN B12: Vitamin B-12: 217 pg/mL (ref 180–914)

## 2019-08-21 MED ORDER — METHYLPREDNISOLONE SODIUM SUCC 40 MG IJ SOLR
40.0000 mg | Freq: Two times a day (BID) | INTRAMUSCULAR | Status: DC
  Administered 2019-08-21 – 2019-08-23 (×5): 40 mg via INTRAVENOUS
  Filled 2019-08-21 (×5): qty 1

## 2019-08-21 MED ORDER — TRAMADOL HCL 50 MG PO TABS
50.0000 mg | ORAL_TABLET | Freq: Three times a day (TID) | ORAL | Status: DC | PRN
  Administered 2019-08-21 – 2019-08-25 (×6): 50 mg via ORAL
  Filled 2019-08-21 (×6): qty 1

## 2019-08-21 MED ORDER — SODIUM CHLORIDE 0.9 % IV SOLN: 250.0000 mL | INTRAVENOUS | Status: DC | PRN

## 2019-08-21 MED ORDER — ONDANSETRON HCL 4 MG PO TABS: 4.0000 mg | ORAL_TABLET | Freq: Four times a day (QID) | ORAL | Status: DC | PRN

## 2019-08-21 MED ORDER — NYSTATIN 100000 UNIT/GM EX POWD
Freq: Two times a day (BID) | CUTANEOUS | Status: DC
  Administered 2019-08-21 – 2019-08-26 (×12): via TOPICAL
  Filled 2019-08-21 (×3): qty 15

## 2019-08-21 MED ORDER — DILTIAZEM HCL 25 MG/5ML IV SOLN
10.0000 mg | Freq: Once | INTRAVENOUS | Status: AC
  Administered 2019-08-21: 10 mg via INTRAVENOUS
  Filled 2019-08-21: qty 5

## 2019-08-21 MED ORDER — ACETAMINOPHEN 650 MG RE SUPP: 650.0000 mg | Freq: Four times a day (QID) | RECTAL | Status: DC | PRN

## 2019-08-21 MED ORDER — MUPIROCIN 2 % EX OINT
1.0000 "application " | TOPICAL_OINTMENT | Freq: Two times a day (BID) | CUTANEOUS | Status: AC
  Administered 2019-08-21 – 2019-08-25 (×10): 1 via NASAL
  Filled 2019-08-21: qty 22

## 2019-08-21 MED ORDER — FLUTICASONE FUROATE-VILANTEROL 200-25 MCG/INH IN AEPB
1.0000 | INHALATION_SPRAY | Freq: Every day | RESPIRATORY_TRACT | Status: DC
  Administered 2019-08-21 – 2019-08-23 (×3): 1 via RESPIRATORY_TRACT
  Filled 2019-08-21: qty 28

## 2019-08-21 MED ORDER — VITAMIN B-12 1000 MCG PO TABS
1000.0000 ug | ORAL_TABLET | Freq: Every day | ORAL | Status: DC
  Administered 2019-08-21 – 2019-08-25 (×5): 1000 ug via ORAL
  Filled 2019-08-21 (×6): qty 1

## 2019-08-21 MED ORDER — FUROSEMIDE 10 MG/ML IJ SOLN
40.0000 mg | Freq: Two times a day (BID) | INTRAMUSCULAR | Status: DC
  Administered 2019-08-21 – 2019-08-24 (×7): 40 mg via INTRAVENOUS
  Filled 2019-08-21 (×7): qty 4

## 2019-08-21 MED ORDER — DILTIAZEM HCL-DEXTROSE 125-5 MG/125ML-% IV SOLN (PREMIX)
5.0000 mg/h | INTRAVENOUS | Status: DC
  Administered 2019-08-21: 5 mg/h via INTRAVENOUS
  Filled 2019-08-21: qty 125

## 2019-08-21 MED ORDER — ISOSORBIDE MONONITRATE ER 60 MG PO TB24
30.0000 mg | ORAL_TABLET | Freq: Every day | ORAL | Status: DC
  Administered 2019-08-21 – 2019-08-26 (×6): 30 mg via ORAL
  Filled 2019-08-21 (×8): qty 1

## 2019-08-21 MED ORDER — SODIUM CHLORIDE 0.9% FLUSH
3.0000 mL | Freq: Two times a day (BID) | INTRAVENOUS | Status: DC
  Administered 2019-08-21 – 2019-08-27 (×13): 3 mL via INTRAVENOUS

## 2019-08-21 MED ORDER — METHYLPREDNISOLONE SODIUM SUCC 40 MG IJ SOLR: 40.0000 mg | Freq: Three times a day (TID) | INTRAMUSCULAR | Status: DC

## 2019-08-21 MED ORDER — METOPROLOL TARTRATE 25 MG PO TABS: 25.0000 mg | ORAL_TABLET | Freq: Two times a day (BID) | ORAL | Status: DC

## 2019-08-21 MED ORDER — METOPROLOL TARTRATE 50 MG PO TABS
75.0000 mg | ORAL_TABLET | Freq: Two times a day (BID) | ORAL | Status: DC
  Administered 2019-08-21 – 2019-08-25 (×10): 75 mg via ORAL
  Filled 2019-08-21 (×11): qty 1

## 2019-08-21 MED ORDER — LORAZEPAM 1 MG PO TABS
1.0000 mg | ORAL_TABLET | Freq: Every evening | ORAL | Status: DC | PRN
  Administered 2019-08-21 (×2): 1 mg via ORAL
  Filled 2019-08-21 (×2): qty 1

## 2019-08-21 MED ORDER — BISACODYL 10 MG RE SUPP: 10.0000 mg | Freq: Every day | RECTAL | Status: DC | PRN

## 2019-08-21 MED ORDER — PANTOPRAZOLE SODIUM 40 MG IV SOLR
40.0000 mg | INTRAVENOUS | Status: DC
  Administered 2019-08-21 – 2019-08-25 (×5): 40 mg via INTRAVENOUS
  Filled 2019-08-21 (×5): qty 40

## 2019-08-21 MED ORDER — SODIUM CHLORIDE 0.9% FLUSH: 3.0000 mL | INTRAVENOUS | Status: DC | PRN

## 2019-08-21 MED ORDER — HYDROCHLOROTHIAZIDE 25 MG PO TABS: 25.0000 mg | ORAL_TABLET | Freq: Every day | ORAL | Status: DC

## 2019-08-21 MED ORDER — CHLORHEXIDINE GLUCONATE CLOTH 2 % EX PADS
6.0000 | MEDICATED_PAD | Freq: Every day | CUTANEOUS | Status: DC
  Administered 2019-08-21 – 2019-08-25 (×5): 6 via TOPICAL

## 2019-08-21 MED ORDER — ONDANSETRON HCL 4 MG/2ML IJ SOLN
4.0000 mg | Freq: Four times a day (QID) | INTRAMUSCULAR | Status: DC | PRN
  Administered 2019-08-22 – 2019-08-25 (×2): 4 mg via INTRAVENOUS
  Filled 2019-08-21 (×2): qty 2

## 2019-08-21 MED ORDER — ASPIRIN EC 81 MG PO TBEC
162.0000 mg | DELAYED_RELEASE_TABLET | Freq: Every day | ORAL | Status: DC
  Administered 2019-08-21 – 2019-08-25 (×5): 162 mg via ORAL
  Filled 2019-08-21 (×5): qty 2

## 2019-08-21 MED ORDER — ACETAMINOPHEN 325 MG PO TABS
650.0000 mg | ORAL_TABLET | Freq: Four times a day (QID) | ORAL | Status: DC | PRN
  Administered 2019-08-21 – 2019-08-23 (×3): 650 mg via ORAL
  Filled 2019-08-21 (×3): qty 2

## 2019-08-21 MED ORDER — POLYETHYLENE GLYCOL 3350 17 G PO PACK: 17.0000 g | PACK | Freq: Every day | ORAL | Status: DC | PRN

## 2019-08-21 MED ORDER — DILTIAZEM HCL 30 MG PO TABS
30.0000 mg | ORAL_TABLET | Freq: Four times a day (QID) | ORAL | Status: DC
  Administered 2019-08-21 – 2019-08-22 (×4): 30 mg via ORAL
  Filled 2019-08-21 (×4): qty 1

## 2019-08-21 MED ORDER — SODIUM CHLORIDE 0.9 % IV SOLN
510.0000 mg | Freq: Once | INTRAVENOUS | Status: AC
  Administered 2019-08-21: 510 mg via INTRAVENOUS
  Filled 2019-08-21: qty 510

## 2019-08-21 MED ORDER — LEVALBUTEROL HCL 0.63 MG/3ML IN NEBU: 0.6300 mg | INHALATION_SOLUTION | Freq: Four times a day (QID) | RESPIRATORY_TRACT | Status: DC | PRN

## 2019-08-21 NOTE — Plan of Care (Signed)
  Problem: Acute Rehab PT Goals(only PT should resolve) Goal: Pt Will Go Supine/Side To Sit Outcome: Progressing Flowsheets (Taken 08/21/2019 1229) Pt will go Supine/Side to Sit:  with supervision  with min guard assist Goal: Patient Will Transfer Sit To/From Stand Outcome: Progressing Flowsheets (Taken 08/21/2019 1229) Patient will transfer sit to/from stand:  with supervision  with min guard assist Goal: Pt Will Transfer Bed To Chair/Chair To Bed Outcome: Progressing Flowsheets (Taken 08/21/2019 1229) Pt will Transfer Bed to Chair/Chair to Bed:  with supervision  min guard assist Goal: Pt Will Ambulate Outcome: Progressing Flowsheets (Taken 08/21/2019 1229) Pt will Ambulate:  25 feet  with min guard assist  with rolling walker   12:29 PM, 08/21/19 Lonell Grandchild, MPT Physical Therapist with [facility] [phone] office [phone] mobile phone

## 2019-08-21 NOTE — Progress Notes (Signed)
ANTICOAGULATION CONSULT NOTE -   Pharmacy Consult for heparin infusion Indication: pulmonary embolus  Allergies  Allergen Reactions  . Vancomycin Cross Reactors Anaphylaxis    Patient Measurements: Height: 4' 9.99" (147.3 cm) Weight: (!) 319 lb 0.1 oz (144.7 kg) IBW/kg (Calculated) : 40.88 Heparin Dosing Weight: HEPARIN DW (KG): 80.3 HEPARIN DW (KG): 80.3 HEPARIN DW (KG): 80.3  Vital Signs: Temp: 98.3 F (36.8 C) (12/03 1300) Temp Source: Oral (12/03 1300) BP: 106/66 (12/03 1245) Pulse Rate: 50 (12/03 1245)  Labs: Recent Labs    08/24/2019 1907 08/30/2019 2119 08/21/19 0649 08/21/19 0958 08/21/19 1347  HGB 11.9*  --  11.1*  --   --   HCT 43.5  --  39.2  --   --   PLT 118*  --  124*  --   --   APTT  --  33  --   --   --   HEPARINUNFRC  --  <0.10* 0.62  --  0.70  CREATININE 0.95  --  1.09*  --   --   TROPONINIHS 6 8 9 9 10     Estimated Creatinine Clearance: 66 mL/min (A) (by C-G formula based on SCr of 1.09 mg/dL (H)).   Medical History: Past Medical History:  Diagnosis Date  . Anxiety   . Arthritis   . Asthma   . Chronic back pain   . COPD (chronic obstructive pulmonary disease) ([place])   . Edema of both legs    chronic  . GERD (gastroesophageal reflux disease)   . H/O dizziness   . H/O shortness of breath   . Headache(784.0)   . Hx of migraines   . Hypertension   . Knee pain, bilateral   . Obstructive sleep apnea    On CPAP by nasal prong  . Palpitations 12/2012   ? SVT  . Seasonal allergies   . Stroke [facility])    last stoke Oct 2016  . Thoracic aortic aneurysm [facility]) 12/2012    12/2012: 4.3 cm fusiform aneurysm-ascending thoracic aorta  . Varicose veins of both legs with edema       Assessment: Pharmacy consulted to dose heparin for this 68 yo female with possible PE. Her D-Dimer is elevated at 2.99ug/mL-FEU.  She wasn't taking any prior anti-coagulants prior to admission.    Heparin level is therapeutic at 0.70  Goal of Therapy:  Heparin level  0.3-0.7 units/ml Monitor platelets by anticoagulation protocol: Yes   Plan:  Continue heparin infusion at 1400 units/hr Check anti-Xa level daily while on heparin. Continue to monitor H&H and platelets.  Margot Ables, PharmD Clinical Pharmacist 08/21/2019 3:35 PM

## 2019-08-21 NOTE — Progress Notes (Signed)
*  PRELIMINARY RESULTS* Echocardiogram 2D Echocardiogram has been performed.  Sabrina Curry 08/21/2019, 11:40 AM

## 2019-08-21 NOTE — Progress Notes (Signed)
ANTICOAGULATION CONSULT NOTE -   Pharmacy Consult for heparin infusion Indication: pulmonary embolus  Allergies  Allergen Reactions  . Vancomycin Cross Reactors Anaphylaxis    Patient Measurements: Height: 4' 9.99" (147.3 cm) Weight: (!) 319 lb 0.1 oz (144.7 kg) IBW/kg (Calculated) : 40.88 Heparin Dosing Weight: HEPARIN DW (KG): 80.3 HEPARIN DW (KG): 80.3 HEPARIN DW (KG): 80.3  Vital Signs: Temp: 98 F (36.7 C) (12/03 0400) Temp Source: (P) Oral (12/03 0746) BP: 126/81 (12/03 0500) Pulse Rate: 98 (12/03 0505)  Labs: Recent Labs    08/29/2019 1907 08/28/2019 2119 08/21/19 0649  HGB 11.9*  --  11.1*  HCT 43.5  --  39.2  PLT 118*  --  124*  APTT  --  33  --   HEPARINUNFRC  --  <0.10* 0.62  CREATININE 0.95  --  1.09*  TROPONINIHS 6 8 9     Estimated Creatinine Clearance: 66 mL/min (A) (by C-G formula based on SCr of 1.09 mg/dL (H)).   Medical History: Past Medical History:  Diagnosis Date  . Anxiety   . Arthritis   . Asthma   . Chronic back pain   . COPD (chronic obstructive pulmonary disease) ([place])   . Edema of both legs    chronic  . GERD (gastroesophageal reflux disease)   . H/O dizziness   . H/O shortness of breath   . Headache(784.0)   . Hx of migraines   . Hypertension   . Knee pain, bilateral   . Obstructive sleep apnea    On CPAP by nasal prong  . Palpitations 12/2012   ? SVT  . Seasonal allergies   . Stroke [facility])    last stoke Oct 2016  . Thoracic aortic aneurysm [facility]) 12/2012    12/2012: 4.3 cm fusiform aneurysm-ascending thoracic aorta  . Varicose veins of both legs with edema       Assessment: Pharmacy consulted to dose heparin for this 68 yo female with possible PE. Her D-Dimer is elevated at 2.99ug/mL-FEU.  She wasn't taking any prior anti-coagulants prior to admission.    Heparin level is therapeutic at 0.62  Goal of Therapy:  Heparin level 0.3-0.7 units/ml Monitor platelets by anticoagulation protocol: Yes   Plan:  Continue heparin  infusion at 1400 units/hr Check anti-Xa level in 6 hours and daily while on heparin. Continue to monitor H&H and platelets.  Margot Ables, PharmD Clinical Pharmacist 08/21/2019 8:12 AM

## 2019-08-21 NOTE — ED Notes (Signed)
hospitalist in room

## 2019-08-21 NOTE — Evaluation (Signed)
Physical Therapy Evaluation Patient Details Name: Sabrina Curry MRN: [id number] DOB: 09/20/1950 Today's Date: 08/21/2019   History of Present Illness  Sabrina Curry is a 68 y.o. female with medical history significant of asthma, COPD, GERD, lymphedema, anxiety, depression, obstructive sleep apnea who presented to the ER with worsening shortness of breath.  Patient has had relatively sudden onset shortness of breath over the last 2 days.  Usually on 2 L of home oxygen.  Noted to be satting in the 70s on oxygen at home and had to be placed on 15 L nonrebreather.  Denies any history of chest pain.  Has had worsening lower extremity edema over the last several weeks.  Also reports having abdominal wall edema.  Usually ambulates with a walker at home but has had significant decline in activity.  Has had orthopnea as well.  Does have COPD and obstructive sleep apnea and is supposed to be on CPAP at home but is unable to use it due to claustrophobia and adamantly refuses it in the ER as well.  She is also a DNR and does not wish any aggressive interventions.    Clinical Impression  Patient limited for functional mobility as stated below secondary to BLE weakness, fatigue and poor standing balance.  Patient limited to a few side steps at bedside and tolerated sitting up in chair after therapy, later required assistance to move legs when put back to bed for a procedure.   Patient will benefit from continued physical therapy in hospital and recommended venue below to increase strength, balance, endurance for safe ADLs and gait.     Follow Up Recommendations SNF;Supervision for mobility/OOB;Supervision - Intermittent    Equipment Recommendations  None recommended by PT    Recommendations for Other Services       Precautions / Restrictions Precautions Precautions: Fall Restrictions Weight Bearing Restrictions: No      Mobility  Bed Mobility Overal bed mobility: Needs Assistance Bed Mobility: Supine  to Sit;Sit to Supine     Supine to sit: Min assist Sit to supine: Min assist;Mod assist   General bed mobility comments: requires assistance to move BLE during bed mobility  Transfers Overall transfer level: Needs assistance Equipment used: Rolling walker (2 wheeled) Transfers: Sit to/from [facility] Sit to Stand: Min assist Stand pivot transfers: Min assist;Mod assist       General transfer comment: slow labored movement  Ambulation/Gait Ambulation/Gait assistance: Mod assist Gait Distance (Feet): 5 Feet Assistive device: Rolling walker (2 wheeled) Gait Pattern/deviations: Decreased step length - right;Decreased step length - left;Decreased stride length Gait velocity: slow   General Gait Details: limited to 4-5 slou unsteady side steps at bedside due to  c/o fatigue and BLE weakness  Stairs            Wheelchair Mobility    Modified Rankin (Stroke Patients Only)       Balance Overall balance assessment: Needs assistance Sitting-balance support: Feet supported;No upper extremity supported Sitting balance-Leahy Scale: Fair Sitting balance - Comments: fair/good seated at EOB   Standing balance support: During functional activity;Bilateral upper extremity supported Standing balance-Leahy Scale: Fair Standing balance comment: using RW                             Pertinent Vitals/Pain Pain Assessment: Faces Faces Pain Scale: Hurts even more Pain Location: BLE possibly due to swelling Pain Descriptors / Indicators: Aching;Sore Pain Intervention(s): Limited activity within patient's tolerance;Monitored during session;RN  gave pain meds during session    [place] expects to be discharged to:: Private residence Living Arrangements: Children Available Help at Discharge: Family;Available PRN/intermittently Type of Home: House Home Access: Ramped entrance     Home Layout: One level Home Equipment: [place] - 4  wheels;Shower seat      Prior Function Level of Independence: Independent with assistive device(s)         Comments: household ambulator with Rollator     Hand Dominance   Dominant Hand: Right    Extremity/Trunk Assessment   Upper Extremity Assessment Upper Extremity Assessment: Generalized weakness    Lower Extremity Assessment Lower Extremity Assessment: Generalized weakness    Cervical / Trunk Assessment Cervical / Trunk Assessment: Kyphotic  Communication   Communication: No difficulties  Cognition Arousal/Alertness: Awake/alert Behavior During Therapy: WFL for tasks assessed/performed Overall Cognitive Status: Within Functional Limits for tasks assessed                                        General Comments      Exercises     Assessment/Plan    PT Assessment Patient needs continued PT services  PT Problem List Decreased strength;Decreased activity tolerance;Decreased balance;Decreased mobility       PT Treatment Interventions Gait training;Stair training;Functional mobility training;Therapeutic activities;Therapeutic exercise;Patient/family education    PT Goals (Current goals can be found in the Care Plan section)  Acute Rehab PT Goals Patient Stated Goal: return home with family to assist PT Goal Formulation: With patient Time For Goal Achievement: 09/04/19 Potential to Achieve Goals: Good    Frequency Min 3X/week   Barriers to discharge        Co-evaluation               AM-PAC PT "6 Clicks" Mobility  Outcome Measure Help needed turning from your back to your side while in a flat bed without using bedrails?: A Little Help needed moving from lying on your back to sitting on the side of a flat bed without using bedrails?: A Little Help needed moving to and from a bed to a chair (including a wheelchair)?: A Lot Help needed standing up from a chair using your arms (e.g., wheelchair or bedside chair)?: A Little Help  needed to walk in hospital room?: A Lot Help needed climbing 3-5 steps with a railing? : Total 6 Click Score: 14    End of Session Equipment Utilized During Treatment: Oxygen Activity Tolerance: Patient tolerated treatment well;Patient limited by fatigue Patient left: in chair;with call bell/phone within reach Nurse Communication: Mobility status PT Visit Diagnosis: Unsteadiness on feet (R26.81);Other abnormalities of gait and mobility (R26.89);Muscle weakness (generalized) (M62.81)    Time: 4174-0814 PT Time Calculation (min) (ACUTE ONLY): 32 min   Charges:   PT Evaluation $PT Eval Moderate Complexity: 1 Mod PT Treatments $Therapeutic Activity: 23-37 mins        12:28 PM, 08/21/19 Lonell Grandchild, MPT Physical Therapist with [facility] [phone] office [phone] mobile phone

## 2019-08-21 NOTE — TOC Initial Note (Addendum)
Transition of Care [facility]) - Initial/Assessment Note    Patient Details  Name: Sabrina Curry MRN: [id number] Date of Birth: [birth year]  [facility] CM/SW Contact:    Crystie Yanko, Chauncey Reading, RN Phone Number: 08/21/2019, 1:37 PM  Clinical Narrative:     Patient recommended for SNF vs home with supervision.  She is unsure if she should go. Defers to daughter [name]. Call to [facility] on all numbers listed, no answer, no option to leave voice mail. Patient reports she has never been to rehab.              Will complete FL2 and obtain PASSR in anticipation of SNF need.   Expected Discharge Plan: Skilled Nursing Facility Barriers to Discharge: Continued Medical Work up, SNF Pending bed offer   Patient Goals and CMS Choice Patient states their goals for this hospitalization and ongoing recovery are:: go home CMS Medicare.gov Compare Post Acute Care list provided to:: Patient Choice offered to / list presented to : Patient  Expected Discharge Plan and Services Expected Discharge Plan: [place]   Discharge Planning Services: CM Consult   Living arrangements for the past 2 months: Single Family Home                                      Prior Living Arrangements/Services Living arrangements for the past 2 months: Single Family Home Lives with:: Adult Children                   Activities of Daily Living   ADL Screening (condition at time of admission) Patient's cognitive ability adequate to safely complete daily activities?: Yes Is the patient deaf or have difficulty hearing?: No Does the patient have difficulty seeing, even when wearing glasses/contacts?: No Does the patient have difficulty concentrating, remembering, or making decisions?: No Patient able to express need for assistance with ADLs?: Yes Does the patient have difficulty dressing or bathing?: Yes Independently performs ADLs?: No Communication: Independent Does the patient have  difficulty walking or climbing stairs?: Yes Weakness of Legs: None Weakness of Arms/Hands: None  Permission Sought/Granted                  Emotional Assessment   Attitude/Demeanor/Rapport: Apprehensive   Orientation: : Oriented to Self, Oriented to Place, Oriented to  Time Alcohol / Substance Use: Not Applicable Psych Involvement: No (comment)  Admission diagnosis:  Peripheral edema [R60.9] Atypical pneumonia [J18.9] COPD exacerbation ([place]) [J44.1] Pulmonary vascular congestion [R09.89] Elevated d-dimer [R79.89] Acute respiratory failure with hypoxia and hypercapnia (HCC) [J96.01, J96.02] Atrial fibrillation, unspecified type ([place]) [I48.91] Patient Active Problem List   Diagnosis Date Noted  . Acute on chronic respiratory failure with hypoxia ([place]) 08/21/2019  . 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 Intractable epigastric abdominal pain 01/04/2018  . COPD with acute exacerbation ([place]) 11/20/2017  . Morbid obesity with BMI of 50.0-59.9, adult ([place]) 11/20/2017  . Chest pain at rest 11/19/2017  . Varicose veins of bilateral lower extremities with other complications 97/94/8016  . Swelling of limb 10/10/2016  . Spider veins of both lower extremities 10/10/2016  . COPD exacerbation ([place]) 02/06/2016  . Acute respiratory failure with hypoxia ([place]) 02/06/2016  . Hypoxia 02/06/2016  . COPD (chronic obstructive pulmonary disease) ([place])   . Chronic back pain   . Chronic obstructive pulmonary disease ([place])   . Chest pain 06/24/2015  . TIA (  transient ischemic attack) 06/24/2015  . UTI (lower urinary tract infection) 06/24/2015  . Difficulty speaking   . Slurred speech   . Skin eruption 01/30/2013  . Obstructive sleep apnea   . Thoracic aortic aneurysm ([place]) 12/17/2012  . Paroxysmal supraventricular tachycardia-nonsustained 12/08/2012  . Morbid obesity ([place]) 12/08/2012  . Asthma, chronic 12/08/2012  . Hypertension 12/08/2012   PCP:  Rosita Fire,  MD Pharmacy:   [place], [place] [address] Phone: [phone] Fax: [phone]  [place], [place] [address] Phone: [phone] Fax: [phone]     Social Determinants of Health (SDOH) Interventions    Readmission Risk Interventions No flowsheet data found.

## 2019-08-21 NOTE — Progress Notes (Signed)
Per HPI: Sabrina Curry is a 68 y.o. female with medical history significant of asthma, COPD, GERD, lymphedema, anxiety, depression, obstructive sleep apnea who presented to the ER with worsening shortness of breath.  Patient has had relatively sudden onset shortness of breath over the last 2 days.  Usually on 2 L of home oxygen.  Noted to be satting in the 70s on oxygen at home and had to be placed on 15 L nonrebreather.  Denies any history of chest pain.  Has had worsening lower extremity edema over the last several weeks.  Also reports having abdominal wall edema.  Usually ambulates with a walker at home but has had significant decline in activity.  Has had orthopnea as well.  Does have COPD and obstructive sleep apnea and is supposed to be on CPAP at home but is unable to use it due to claustrophobia and adamantly refuses it in the ER as well.  She is also a DNR and does not wish any aggressive interventions.  12/3: Patient was admitted with acute on chronic hypoxemic and hypercapnic respiratory failure in severe respiratory distress.  She refuses BiPAP or intubation and is DNR.  She continues to have significant nasal cannula oxygen requirements and is nearly on 10 L.  She was started on Cardizem drip as well as heparin for new onset atrial fibrillation with RVR as well as some IV Lasix for acute CHF exacerbation with preserved EF.  She appears to be diuresing quite well and has almost 1.9 L of net negative fluid balance thus far.  We will continue diuresis with IV Lasix.  [place] cardiology consultation with 2D echocardiogram pending.  She has been started on oral Cardizem on top of her home metoprolol and we will plan to try and wean IV Cardizem as tolerated.  Anticipate CT chest angiogram for PE evaluation when stable enough to do so.  PT/OT evaluation pending and will likely require PT on discharge.  Follow a.m. labs.  Total care time: 35 minutes.

## 2019-08-21 NOTE — H&P (Signed)
History and Physical    Sabrina Curry ENI:[id number] DOB: Jan 30, 1951 DOA: 09/15/2019  PCP: Rosita Fire, MD  Patient coming from: Home  I have personally briefly reviewed patient's old medical records in [place]  Chief Complaint: Shortness of breath  HPI: Sabrina Curry is a 68 y.o. female with medical history significant of asthma, COPD, GERD, lymphedema, anxiety, depression, obstructive sleep apnea who presented to the ER with worsening shortness of breath.  Patient has had relatively sudden onset shortness of breath over the last 2 days.  Usually on 2 L of home oxygen.  Noted to be satting in the 70s on oxygen at home and had to be placed on 15 L nonrebreather.  Denies any history of chest pain.  Has had worsening lower extremity edema over the last several weeks.  Also reports having abdominal wall edema.  Usually ambulates with a walker at home but has had significant decline in activity.  Has had orthopnea as well.  Does have COPD and obstructive sleep apnea and is supposed to be on CPAP at home but is unable to use it due to claustrophobia and adamantly refuses it in the ER as well.  She is also a DNR and does not wish any aggressive interventions. ED Course:  Vital Signs reviewed on presentation, significant for temperature 97.9, heart rate 101, blood pressure 137/101, saturation 89% on 15 L nasal cannula Labs reviewed, significant for arterial blood gas shows a pH of 7.27, PCO2 92, PO2 55, saturation 95%.  Sodium 144, potassium 5.2, BUN 20, creatinine 0.95, troponin negative x2, lactic acid 1.3, WBC count 6.5, hemoglobin 11.8, crit 43, MCV 114, platelets 118 Imaging personally Reviewed, chest x-ray shows patchy bilateral airspace opacities, possibly multifocal infection, cardiomegaly with mild central congestion. EKG personally reviewed, shows atrial fibrillation   Review of Systems: As per HPI otherwise 10 point review of systems negative.  All other review of systems is  negative except the ones noted above in the HPI.  Past Medical History:  Diagnosis Date  . Anxiety   . Arthritis   . Asthma   . Chronic back pain   . COPD (chronic obstructive pulmonary disease) ([place])   . Edema of both legs    chronic  . GERD (gastroesophageal reflux disease)   . H/O dizziness   . H/O shortness of breath   . Headache(784.0)   . Hx of migraines   . Hypertension   . Knee pain, bilateral   . Obstructive sleep apnea    On CPAP by nasal prong  . Palpitations 12/2012   ? SVT  . Seasonal allergies   . Stroke [facility])    last stoke Oct 2016  . Thoracic aortic aneurysm [facility]) 12/2012    12/2012: 4.3 cm fusiform aneurysm-ascending thoracic aorta  . Varicose veins of both legs with edema     Past Surgical History:  Procedure Laterality Date  . ABDOMINAL HYSTERECTOMY    . APPENDECTOMY     with gallbladder removal  . BACK SURGERY     had staff infection of back  . CATARACT EXTRACTION W/PHACO  01/16/2012   Procedure: CATARACT EXTRACTION PHACO AND INTRAOCULAR LENS PLACEMENT (IOC);  Surgeon: Elta Guadeloupe T. Gershon Crane, MD;  Location: [facility];  Service: Ophthalmology;  Laterality: Right;  CDE 6.12  . CHOLECYSTECTOMY     [facility]  . HERNIA REPAIR  2009   inscional hernia repair x2-3  . UMBILICAL HERNIA REPAIR       reports that she has never smoked.  She has never used smokeless tobacco. She reports that she does not drink alcohol or use drugs.  Allergies  Allergen Reactions  . Vancomycin Cross Reactors Anaphylaxis    Family History  Problem Relation Age of Onset  . Hypertension Mother   . Diabetes Mother   . Hyperlipidemia Father        Also hypertension and an aneurysm  . Anesthesia problems Neg Hx   . Hypotension Neg Hx   . Pseudochol deficiency Neg Hx   . Malignant hyperthermia Neg Hx    Family history reviewed, noted as above, not pertinent to current presentation.   Prior to Admission medications   Medication Sig Start Date End Date Taking? Authorizing Provider   albuterol (PROAIR HFA) 108 (90 Base) MCG/ACT inhaler Inhale 1 puff into the lungs every 6 (six) hours as needed for wheezing or shortness of breath.    [provider]  aspirin 81 MG tablet Take 2 tablets (162 mg total) by mouth daily. 06/25/15   Kathie Dike, MD  budesonide-formoterol (SYMBICORT) 160-4.5 MCG/ACT inhaler Inhale 2 puffs into the lungs daily.    [provider]  butalbital-acetaminophen-caffeine (FIORICET) [id number] MG tablet  05/22/19   [provider]  furosemide (LASIX) 20 MG tablet Take 1 tablet (20 mg total) by mouth daily. 07/10/19   Herminio Commons, MD  hydrochlorothiazide (HYDRODIURIL) 25 MG tablet Take 1 tablet (25 mg total) by mouth daily. 07/10/19   Herminio Commons, MD  isosorbide mononitrate (IMDUR) 30 MG 24 hr tablet TAKE (1) TABLET BY MOUTH ONCE DAILY. 07/10/19   Herminio Commons, MD  metoprolol tartrate (LOPRESSOR) 50 MG tablet Take 1.5 tablets (75 mg total) by mouth 2 (two) times daily. 07/09/19 10/07/19  Herminio Commons, MD  OXYGEN Inhale into the lungs.    [provider]  pantoprazole (PROTONIX) 40 MG tablet Take 1 tablet (40 mg total) by mouth daily. 06/25/15   Kathie Dike, MD  potassium chloride (KLOR-CON) 10 MEQ tablet Take 1 tablet (10 mEq total) by mouth 2 (two) times daily. 07/10/19   Herminio Commons, MD  predniSONE (DELTASONE) 10 MG tablet Take 1 tablet (10 mg total) by mouth daily with breakfast. 01/07/18   Isaac Bliss, Rayford Halsted, MD  promethazine (PHENERGAN) 25 MG tablet Take 25 mg by mouth every 6 (six) hours as needed for nausea or vomiting.    [provider]  vitamin B-12 (CYANOCOBALAMIN) 1000 MCG tablet Take 1 tablet (1,000 mcg total) by mouth daily. 06/25/15   Kathie Dike, MD    Physical Exam: Vitals:   08/25/2019 2225 09/12/2019 2233 09/10/2019 2242 09/15/2019 2330  BP:  133/69  120/87  Pulse:  88 99 (!) 112  Resp:  (!) 26 (!) 21 (!) 29  Temp:      TempSrc:      SpO2: (!)  77% 95% 98% 96%  Weight:   (!) 148.4 kg   Height:   4' 9.99" (1.473 m)     Constitutional: NAD, calm, comfortable Vitals:   09/04/2019 2225 08/24/2019 2233 09/11/2019 2242 08/31/2019 2330  BP:  133/69  120/87  Pulse:  88 99 (!) 112  Resp:  (!) 26 (!) 21 (!) 29  Temp:      TempSrc:      SpO2: (!) 77% 95% 98% 96%  Weight:   (!) 148.4 kg   Height:   4' 9.99" (1.473 m)    Eyes: PERRL, lids and conjunctivae normal ENMT: Mucous membranes are moist. Posterior pharynx  clear of any exudate or lesions.Normal dentition.  Neck: normal, supple, no masses, no thyromegaly Respiratory: Patient has bilateral crepitations, wheezing.  Appears to be in moderate respiratory distress.  Accessory muscle use positive.   Cardiovascular: Tachycardia, irregular rhythm noted no murmurs / rubs / gallops.  Moderate JVD noted.  Patient has extensive bilateral lower extreme edema with abdominal wall edema.   Abdomen: no tenderness, no masses palpated. No hepatosplenomegaly. Bowel sounds positive.  Musculoskeletal: no clubbing / cyanosis. No joint deformity upper and lower extremities. Good ROM, no contractures. Normal muscle tone.  Skin: no rashes, lesions, ulcers. No induration Neurologic: CN 2-12 grossly intact. Sensation intact, DTR normal. Strength 5/5 in all 4.  Psychiatric: Normal judgment and insight. Alert and oriented x 3. Normal mood.    Decubitus Ulcers: Not present on admission Catheters and tubes: None   Labs on Admission: I have personally reviewed following labs and imaging studies  CBC: Recent Labs  Lab 09/07/2019 1907  WBC 6.5  NEUTROABS 4.8  HGB 11.9*  HCT 43.5  MCV 114.5*  PLT 967*   Basic Metabolic Panel: Recent Labs  Lab 08/26/2019 1907  NA 144  K 5.2*  CL 99  CO2 35*  GLUCOSE 115*  BUN 20  CREATININE 0.95  CALCIUM 9.2   GFR: Estimated Creatinine Clearance: 77.2 mL/min (by C-G formula based on SCr of 0.95 mg/dL). Liver Function Tests: No results for input(s): AST, ALT, ALKPHOS,  BILITOT, PROT, ALBUMIN in the last 168 hours. No results for input(s): LIPASE, AMYLASE in the last 168 hours. No results for input(s): AMMONIA in the last 168 hours. Coagulation Profile: No results for input(s): INR, PROTIME in the last 168 hours. Cardiac Enzymes: No results for input(s): CKTOTAL, CKMB, CKMBINDEX, TROPONINI in the last 168 hours. BNP (last 3 results) No results for input(s): PROBNP in the last 8760 hours. HbA1C: No results for input(s): HGBA1C in the last 72 hours. CBG: No results for input(s): GLUCAP in the last 168 hours. Lipid Profile: No results for input(s): CHOL, HDL, LDLCALC, TRIG, CHOLHDL, LDLDIRECT in the last 72 hours. Thyroid Function Tests: No results for input(s): TSH, T4TOTAL, FREET4, T3FREE, THYROIDAB in the last 72 hours. Anemia Panel: No results for input(s): VITAMINB12, FOLATE, FERRITIN, TIBC, IRON, RETICCTPCT in the last 72 hours. Urine analysis:    Component Value Date/Time   COLORURINE YELLOW 01/04/2018 0209   APPEARANCEUR CLEAR 01/04/2018 0209   LABSPEC 1.012 01/04/2018 0209   PHURINE 7.0 01/04/2018 0209   GLUCOSEU NEGATIVE 01/04/2018 0209   HGBUR NEGATIVE 01/04/2018 0209   BILIRUBINUR NEGATIVE 01/04/2018 0209   KETONESUR NEGATIVE 01/04/2018 0209   PROTEINUR 30 (A) 01/04/2018 0209   UROBILINOGEN 0.2 06/23/2015 2350   NITRITE NEGATIVE 01/04/2018 0209   LEUKOCYTESUR NEGATIVE 01/04/2018 0209    Radiological Exams on Admission: Dg Chest Portable 1 View  Result Date: 09/09/2019 CLINICAL DATA:  Shortness of breath EXAM: PORTABLE CHEST 1 VIEW COMPARISON:  01/05/2018 FINDINGS: Chronic elevation of the right diaphragm. Cardiomegaly with mild central congestion. Patchy airspace opacities bilaterally. No pleural effusion or pneumothorax. IMPRESSION: 1. Patchy bilateral airspace opacities, possible multifocal infection, consider atypical or viral process 2. Cardiomegaly with mild central congestion Electronically Signed   By: Donavan Foil M.D.   On:  09/18/2019 17:18      Assessment/Plan Active Problems:   Morbid obesity ([place])   Hypertension   Obstructive sleep apnea   Thoracic aortic aneurysm ([facility])   COPD exacerbation ([place])   Acute respiratory failure with hypoxia ([place])  Acute on chronic respiratory failure with hypoxia ([facility])     Principal Problem: Acute on chronic hypoxemic and hypercapnic respiratory failure: Patient presented with severe respiratory distress.  Usually on 2 L of home oxygen.  Noted to have saturation in the 70s at home.  Etiology possibly multifactorial with exacerbation of COPD and asthma as well as fluid overload from underlying CHF and concern for possible atypical infection.  SARS-CoV-2 RT-PCR testing has been negative.  D-dimer was elevated however CT angio could not be done at the patient was unable to lie down flat. Required 15 L of nonrebreather in the ER.  ABG showed hypercapnic respiratory failure.  Patient has adamantly refused BiPAP is also a DNR and does not wish to be intubated.  She does not have any significant signs of encephalopathy currently. Will give oxygen as needed and monitor blood gases  Other Active Problems: Acute exacerbation of CHF with preserved ejection fraction: Most recent echocardiogram on 11/19/2017 showed EF of 60 to 65%, mild LVH. Patient is noted to have significant anasarca with evidence of pulmonary edema.  BNP is elevated at 255. We will place on IV Lasix, monitor input output, renal function, daily weights.  Probable acute exacerbation of asthma/COPD: We will place on nebulizers and IV steroids.  Continue Symbicort.  New onset atrial fibrillation with rapid ventricular response: Patient presented with new onset A. fib with heart rate in the 120s to 130s.  Given 1 dose of IV Cardizem and has been started on a Cardizem drip which be titrated.  Will monitor serial troponins.  Echocardiogram has been ordered.  Has been placed on heparin drip and will be titrated.  Consider  cardiology consult.  Obstructive sleep apnea: Patient does not wish to use CPAP or BiPAP at home.  She adamantly refuses to try it in the hospital as well.  Chronic macrocytic anemia: Hemoglobin 11.9 on presentation.  Appears to be close to baseline of around 11-13.  Noted to have elevated MCV.  We will send work-up.  Mild thrombocytopenia: Presented to the platelet count of 118, count has declined from prior baseline.  With previous platelet counts fluctuating between 150 and 180.  Etiology is unclear.  We will continue to monitor closely.  Anxiety: Continue Ativan as needed  Gastroesophageal reflux disease: We will place on IV PPI  Acute on chronic physical deconditioning: We will get PT/OT evaluation  DVT prophylaxis: Heparin Code Status:  Full code Family Communication: N/A  Disposition Plan: Admitted as inpatient for respiratory failure, A. fib, CHF exacerbation, expect 3 to 4-day stay. Consults called: N/A Admission status: Inpatient status   Lynetta Mare MD [facility]  If 7PM-7AM, please contact night-coverage   08/21/2019, 12:46 AM

## 2019-08-21 NOTE — Consult Note (Signed)
Cardiology Consultation:   Patient ID: Sabrina Curry MRN: [id number]; DOB: [birth year]  Admit date: 09/06/2019 Date of Consult: 08/21/2019  Primary Care Provider: Rosita Fire, MD Primary Cardiologist: Kate Sable, MD  Primary Electrophysiologist:  None    Patient Profile:   Sabrina Curry is a 68 y.o. female with a hx of COPD on home O2, morbid obesity who is being seen today for the evaluation of SOB at the request of Dr Manuella Ghazi.  History of Present Illness:   Sabrina Curry 68 yo female history of DNR status, PSVT, HTN, prior TIA, thoracic aortic aneurysm, chronic LE edema, COPD on 2L at home, OSA not compliant with cpap and chronic chest pain presents with SOB. From notes severely hypoxic on presentation, placed on NRB (she refused cpap/bipap in ER). Has had some worsening LE edema over the last few weeks. +orthopnea.     BNP 255 Ddimer 2.99 K 5.2 Cr 0.95 BUN 20 Lactic acid 1.3 WBC 6.5 Hgb 11.9 Plt 118 TSH 1.299 7.27/92/55/35 on 15L [place] COVID neg hstrop 6-->8 CXR patchy bilateral airspace opacities, possibly multifocal congestion, cardiomegaly with mild central congestion EKG afib rate 99 Echo pending  Heart Pathway Score:     Past Medical History:  Diagnosis Date   Anxiety    Arthritis    Asthma    Chronic back pain    COPD (chronic obstructive pulmonary disease) ([facility])    Edema of both legs    chronic   GERD (gastroesophageal reflux disease)    H/O dizziness    H/O shortness of breath    Headache(784.0)    Hx of migraines    Hypertension    Knee pain, bilateral    Obstructive sleep apnea    On CPAP by nasal prong   Palpitations 12/2012   ? SVT   Seasonal allergies    Stroke [facility])    last stoke Oct 2016   Thoracic aortic aneurysm [facility]) 12/2012    12/2012: 4.3 cm fusiform aneurysm-ascending thoracic aorta   Varicose veins of both legs with edema     Past Surgical History:  Procedure Laterality Date   ABDOMINAL HYSTERECTOMY     APPENDECTOMY      with gallbladder removal   BACK SURGERY     had staff infection of back   CATARACT EXTRACTION W/PHACO  01/16/2012   Procedure: CATARACT EXTRACTION PHACO AND INTRAOCULAR LENS PLACEMENT ([place]);  Surgeon: Elta Guadeloupe T. Gershon Crane, MD;  Location: [facility];  Service: Ophthalmology;  Laterality: Right;  CDE 6.12   CHOLECYSTECTOMY     [facility]   HERNIA REPAIR  2009   inscional hernia repair K4-8   UMBILICAL HERNIA REPAIR        Inpatient Medications: Scheduled Meds:  aspirin EC  162 mg Oral Daily   Chlorhexidine Gluconate Cloth  6 each Topical Daily   fluticasone furoate-vilanterol  1 puff Inhalation Daily   furosemide  40 mg Intravenous Q12H   isosorbide mononitrate  30 mg Oral Daily   methylPREDNISolone (SOLU-MEDROL) injection  40 mg Intravenous Q12H   metoprolol tartrate  75 mg Oral BID   pantoprazole (PROTONIX) IV  40 mg Intravenous Q24H   sodium chloride flush  3 mL Intravenous Q12H   vitamin B-12  1,000 mcg Oral Daily   Continuous Infusions:  sodium chloride     diltiazem (CARDIZEM) infusion 2.5 mg/hr (08/21/19 0500)   heparin 1,400 Units/hr (08/21/19 0500)   PRN Meds: sodium chloride, acetaminophen **OR** acetaminophen, bisacodyl, iohexol, levalbuterol, LORazepam, ondansetron **OR** ondansetron (ZOFRAN) IV,  polyethylene glycol, sodium chloride flush, traMADol  Allergies:    Allergies  Allergen Reactions   Vancomycin Cross Reactors Anaphylaxis    Social History:   Social History   Socioeconomic History   Marital status: Widowed    Spouse name: Not on file   Number of children: Not on file   Years of education: Not on file   Highest education level: Not on file  Occupational History   Occupation: CNA-retired    Comment: [employer]  Social Needs   Financial resource strain: Not on file   Food insecurity    Worry: Not on file    Inability: Not on file   Transportation needs    Medical: Not on file    Non-medical: Not on file    Tobacco Use   Smoking status: Never Smoker   Smokeless tobacco: Never Used  Substance and Sexual Activity   Alcohol use: No    Alcohol/week: 0.0 standard drinks   Drug use: No   Sexual activity: Yes    Birth control/protection: Post-menopausal, Surgical  Lifestyle   Physical activity    Days per week: Not on file    Minutes per session: Not on file   Stress: Not on file  Relationships   Social connections    Talks on phone: Not on file    Gets together: Not on file    Attends religious service: Not on file    Active member of club or organization: Not on file    Attends meetings of clubs or organizations: Not on file    Relationship status: Not on file   Intimate partner violence    Fear of current or ex partner: Not on file    Emotionally abused: Not on file    Physically abused: Not on file    Forced sexual activity: Not on file  Other Topics Concern   Not on file  Social History Narrative   Not on file    Family History:    Family History  Problem Relation Age of Onset   Hypertension Mother    Diabetes Mother    Hyperlipidemia Father        Also hypertension and an aneurysm   Anesthesia problems Neg Hx    Hypotension Neg Hx    Pseudochol deficiency Neg Hx    Malignant hyperthermia Neg Hx      ROS:  Please see the history of present illness.  All other ROS reviewed and negative.     Physical Exam/Data:   Vitals:   08/21/19 0415 08/21/19 0500 08/21/19 0505 08/21/19 0746  BP: 116/64 126/81    Pulse: 99 (!) 133 98   Resp: (!) 34 (!) 25 (!) 31   Temp:      TempSrc:    (P) Oral  SpO2: 97% 93% 91%   Weight:  (!) 144.7 kg    Height:        Intake/Output Summary (Last 24 hours) at 08/21/2019 0817 Last data filed at 08/21/2019 0500 Gross per 24 hour  Intake 157.73 ml  Output 2100 ml  Net -1942.27 ml   Last 3 Weights 08/21/2019 09/17/2019 08/30/2019  Weight (lbs) 319 lb 0.1 oz 327 lb 2.6 oz 327 lb 3.2 oz  Weight (kg) 144.7 kg 148.4 kg  148.417 kg     Body mass index is 66.69 kg/m.  General:  Well nourished, well developed, in no acute distress HEENT: normal Lymph: no adenopathy Neck: elevated JVD Endocrine:  No thryomegaly Vascular:  No carotid bruits; FA pulses 2+ bilaterally without bruits  Cardiac:  Irreg, no m/r/g Lungs:  Coarse bilaterally Abd: soft, nontender, no hepatomegaly  Ext: 2+ bilateral LE edema Musculoskeletal:  No deformities, BUE and BLE strength normal and equal Skin: warm and dry  Neuro:  CNs 2-12 intact, no focal abnormalities noted Psych:  Normal affect    Relevant CV Studies:   Laboratory Data:  High Sensitivity Troponin:   Recent Labs  Lab 09/12/2019 1907 09/12/2019 2119 08/21/19 0649  TROPONINIHS 6 8 9      Chemistry Recent Labs  Lab 08/21/2019 1907 08/21/19 0649  NA 144 144  K 5.2* 4.4  CL 99 94*  CO2 35* 40*  GLUCOSE 115* 139*  BUN 20 21  CREATININE 0.95 1.09*  CALCIUM 9.2 8.9  GFRNONAA >60 53*  GFRAA >60 >60  ANIONGAP 10 10    No results for input(s): PROT, ALBUMIN, AST, ALT, ALKPHOS, BILITOT in the last 168 hours. Hematology Recent Labs  Lab 08/19/2019 1907 08/21/19 0649  WBC 6.5 8.8  RBC 3.80* 3.52*   3.51*  HGB 11.9* 11.1*  HCT 43.5 39.2  MCV 114.5* 111.4*  MCH 31.3 31.5  MCHC 27.4* 28.3*  RDW 15.4 15.3  PLT 118* 124*   BNP Recent Labs  Lab 08/24/2019 1706  BNP 255.0*    DDimer  Recent Labs  Lab 08/19/2019 1716  DDIMER 2.99*     Radiology/Studies:  Dg Chest Portable 1 View  Result Date: 09/16/2019 CLINICAL DATA:  Shortness of breath EXAM: PORTABLE CHEST 1 VIEW COMPARISON:  01/05/2018 FINDINGS: Chronic elevation of the right diaphragm. Cardiomegaly with mild central congestion. Patchy airspace opacities bilaterally. No pleural effusion or pneumothorax. IMPRESSION: 1. Patchy bilateral airspace opacities, possible multifocal infection, consider atypical or viral process 2. Cardiomegaly with mild central congestion Electronically Signed   By: Donavan Foil M.D.   On: 09/14/2019 17:18    Assessment and Plan:   1. Acute on chronic hypoxic/hypercapneic respiratory failure - marked hypoxia and hypercapnea on presentation. Patient refused bipap, she is also DNR. Has been on NRB - potential component of both COPD exacerbation and fluid overload. With elevated Ddimer in severely obese sedentary patient with marked hypoxia a CT PE was considered but patient was too unstable from respiratory standpoint. WOuld obtian CT PE when able, she is on anticoag in setting of new onset afib at this time.  - COPD management per primary team - from volume standpoint she is on lasix 74m IV bid. Negative 1.9 L since admit, mild uptrend in Cr. She does have chronic LE edema accoding to clinic notes and thus not reflecting of her total volume status - echo is pending. From last year LVEF 60-65%, cannot eval diastolic function  - continue IV diuretics today, f/u echo.    2. New diagnosis of afib - started on dilt gtt for rate control, remains on her home lopressor 782mbid.Start oral dilt 3053mvery 6 hours with hold parameters, wean dilt gtt. With her degree of hypoxia would anticipate some degree of physiological tachycardia    - CHADS2Vasc score is (CHF, HTN, age [age]troke x2, gender) is 6, started on hep gtt. Continue IV hep for now, once clear no invasive procedures will be required can transition to oral anticoag.    3. COPD on home O2 - management per primary team    For questions or updates, please contact [facility] consult [email or web address] for contact info under     Signed, BraCarlyle Dolly  MD  08/21/2019 8:17 AM

## 2019-08-22 ENCOUNTER — Inpatient Hospital Stay (HOSPITAL_COMMUNITY): Payer: 59

## 2019-08-22 ENCOUNTER — Encounter (HOSPITAL_COMMUNITY): Payer: Self-pay | Admitting: Radiology

## 2019-08-22 DIAGNOSIS — R110 Nausea: Secondary | ICD-10-CM

## 2019-08-22 DIAGNOSIS — F411 Generalized anxiety disorder: Secondary | ICD-10-CM

## 2019-08-22 DIAGNOSIS — J9601 Acute respiratory failure with hypoxia: Secondary | ICD-10-CM

## 2019-08-22 DIAGNOSIS — J9602 Acute respiratory failure with hypercapnia: Secondary | ICD-10-CM

## 2019-08-22 DIAGNOSIS — G4733 Obstructive sleep apnea (adult) (pediatric): Secondary | ICD-10-CM

## 2019-08-22 DIAGNOSIS — R7989 Other specified abnormal findings of blood chemistry: Secondary | ICD-10-CM

## 2019-08-22 DIAGNOSIS — Z515 Encounter for palliative care: Secondary | ICD-10-CM

## 2019-08-22 DIAGNOSIS — I4891 Unspecified atrial fibrillation: Secondary | ICD-10-CM

## 2019-08-22 LAB — BLOOD GAS, ARTERIAL
Acid-Base Excess: 22 mmol/L — ABNORMAL HIGH (ref 0.0–2.0)
Bicarbonate: 42.5 mmol/L — ABNORMAL HIGH (ref 20.0–28.0)
FIO2: 100
O2 Saturation: 79.2 %
Patient temperature: 36.7
pCO2 arterial: 107 mmHg (ref 32.0–48.0)
pH, Arterial: 7.287 — ABNORMAL LOW (ref 7.350–7.450)
pO2, Arterial: 51.7 mmHg — ABNORMAL LOW (ref 83.0–108.0)

## 2019-08-22 LAB — BASIC METABOLIC PANEL
Anion gap: 13 (ref 5–15)
BUN: 25 mg/dL — ABNORMAL HIGH (ref 8–23)
CO2: 41 mmol/L — ABNORMAL HIGH (ref 22–32)
Calcium: 9.1 mg/dL (ref 8.9–10.3)
Chloride: 89 mmol/L — ABNORMAL LOW (ref 98–111)
Creatinine, Ser: 1.1 mg/dL — ABNORMAL HIGH (ref 0.44–1.00)
GFR calc Af Amer: >60 mL/min (ref >60)
GFR calc non Af Amer: 52 mL/min — ABNORMAL LOW (ref >60)
Glucose, Bld: 123 mg/dL — ABNORMAL HIGH (ref 70–99)
Potassium: 4.3 mmol/L (ref 3.5–5.1)
Sodium: 143 mmol/L (ref 135–145)

## 2019-08-22 LAB — CBC
HCT: 38.2 % (ref 36.0–46.0)
Hemoglobin: 10.9 g/dL — ABNORMAL LOW (ref 12.0–15.0)
MCH: 31.5 pg (ref 26.0–34.0)
MCHC: 28.5 g/dL — ABNORMAL LOW (ref 30.0–36.0)
MCV: 110.4 fL — ABNORMAL HIGH (ref 80.0–100.0)
Platelets: 119 10*3/uL — ABNORMAL LOW (ref 150–400)
RBC: 3.46 MIL/uL — ABNORMAL LOW (ref 3.87–5.11)
RDW: 15.2 % (ref 11.5–15.5)
WBC: 7.5 10*3/uL (ref 4.0–10.5)
nRBC: 0 % (ref 0.0–0.2)

## 2019-08-22 LAB — HEPARIN LEVEL (UNFRACTIONATED)
Heparin Unfractionated: 0.88 IU/mL — ABNORMAL HIGH (ref 0.30–0.70)
Heparin Unfractionated: 0.67 IU/mL (ref 0.30–0.70)
Heparin Unfractionated: 0.84 IU/mL — ABNORMAL HIGH (ref 0.30–0.70)

## 2019-08-22 LAB — MAGNESIUM: Magnesium: 1.9 mg/dL (ref 1.7–2.4)

## 2019-08-22 MED ORDER — INFLUENZA VAC A&B SA ADJ QUAD 0.5 ML IM PRSY
0.5000 mL | PREFILLED_SYRINGE | INTRAMUSCULAR | Status: AC
  Administered 2019-08-22: 0.5 mL via INTRAMUSCULAR
  Filled 2019-08-22: qty 0.5

## 2019-08-22 MED ORDER — ORAL CARE MOUTH RINSE
15.0000 mL | Freq: Two times a day (BID) | OROMUCOSAL | Status: DC
  Administered 2019-08-22 – 2019-08-26 (×10): 15 mL via OROMUCOSAL

## 2019-08-22 MED ORDER — PROMETHAZINE HCL 25 MG/ML IJ SOLN
12.5000 mg | Freq: Once | INTRAMUSCULAR | Status: AC
  Administered 2019-08-22: 12.5 mg via INTRAVENOUS
  Filled 2019-08-22: qty 1

## 2019-08-22 MED ORDER — DILTIAZEM HCL ER COATED BEADS 120 MG PO CP24
120.0000 mg | ORAL_CAPSULE | Freq: Every day | ORAL | Status: DC
  Administered 2019-08-22 – 2019-08-26 (×5): 120 mg via ORAL
  Filled 2019-08-22 (×5): qty 1

## 2019-08-22 MED ORDER — LORAZEPAM 0.5 MG PO TABS
0.5000 mg | ORAL_TABLET | Freq: Four times a day (QID) | ORAL | Status: DC | PRN
  Administered 2019-08-22 – 2019-08-24 (×2): 0.5 mg via ORAL
  Filled 2019-08-22 (×2): qty 1

## 2019-08-22 MED ORDER — LORAZEPAM 2 MG/ML IJ SOLN
0.5000 mg | Freq: Once | INTRAMUSCULAR | Status: AC
  Administered 2019-08-22: 0.5 mg via INTRAVENOUS
  Filled 2019-08-22: qty 1

## 2019-08-22 MED ORDER — INFLUENZA VAC A&B SA ADJ QUAD 0.5 ML IM PRSY: 0.5000 mL | PREFILLED_SYRINGE | INTRAMUSCULAR | Status: DC

## 2019-08-22 NOTE — Progress Notes (Signed)
ANTICOAGULATION CONSULT NOTE -   Pharmacy Consult for heparin infusion Indication: pulmonary embolus  Allergies  Allergen Reactions  . Vancomycin Cross Reactors Anaphylaxis    Patient Measurements: Height: 4' 9.99" (147.3 cm) Weight: (!) 314 lb 6 oz (142.6 kg) IBW/kg (Calculated) : 40.88 Heparin Dosing Weight: HEPARIN DW (KG): 80.3 HEPARIN DW (KG): 80.3 HEPARIN DW (KG): 80.3  Vital Signs: Temp: 98.1 F (36.7 C) (12/04 1147) Temp Source: Oral (12/04 1147) BP: 117/79 (12/04 1300) Pulse Rate: 78 (12/04 1400)  Labs: Recent Labs    09/05/2019 1907  08/29/2019 2119 08/21/19 0649 08/21/19 0958 08/21/19 1347 08/22/19 0526 08/22/19 1418  HGB 11.9*  --   --  11.1*  --   --  10.9*  --   HCT 43.5  --   --  39.2  --   --  38.2  --   PLT 118*  --   --  124*  --   --  119*  --   APTT  --   --  33  --   --   --   --   --   HEPARINUNFRC  --    < > <0.10* 0.62  --  0.70 0.88* 0.67  CREATININE 0.95  --   --  1.09*  --   --  1.10*  --   TROPONINIHS 6  --  8 9 9 10   --   --    < > = values in this interval not displayed.    Estimated Creatinine Clearance: 64.8 mL/min (A) (by C-G formula based on SCr of 1.1 mg/dL (H)).   Medical History: Past Medical History:  Diagnosis Date  . Anxiety   . Arthritis   . Asthma   . Chronic back pain   . COPD (chronic obstructive pulmonary disease) ([place])   . Edema of both legs    chronic  . GERD (gastroesophageal reflux disease)   . H/O dizziness   . H/O shortness of breath   . Headache(784.0)   . Hx of migraines   . Hypertension   . Knee pain, bilateral   . Obstructive sleep apnea    On CPAP by nasal prong  . Palpitations 12/2012   ? SVT  . Seasonal allergies   . Stroke [facility])    last stoke Oct 2016  . Thoracic aortic aneurysm [facility]) 12/2012    12/2012: 4.3 cm fusiform aneurysm-ascending thoracic aorta  . Varicose veins of both legs with edema       Assessment: Pharmacy consulted to dose heparin for this 68 yo female with possible PE. Her  D-Dimer is elevated at 2.99ug/mL-FEU.  She wasn't taking any prior anti-coagulants prior to admission.    Heparin level is therapeutic at 0.67  Goal of Therapy:  Heparin level 0.3-0.7 units/ml Monitor platelets by anticoagulation protocol: Yes   Plan:  Continue heparin infusion at 1200 units/hr Check anti-Xa level in 6 hours and daily while on heparin. Continue to monitor H&H and platelets.  Margot Ables, PharmD Clinical Pharmacist 08/22/2019 2:36 PM

## 2019-08-22 NOTE — Consult Note (Signed)
Consultation Note Date: 08/22/2019   Patient Name: Sabrina Curry  DOB: [birth year]  MRN: [id number]  Age / Sex: 68 y.o., female  PCP: Rosita Fire, MD Referring Physician: Rodena Goldmann, DO  Reason for Consultation: Establishing goals of care  HPI/Patient Profile: 69 y.o. female  with past medical history of COPD on home oxygen 2L [place], OSA noncompliant with CPAP, asthma, GERD, lymphedema, anxiety, depression admitted on 08/30/2019 with shortness of breath, sats 70's at home on oxygen. Hospital admission for acute on chronic combined respiratory failure with elevated d-dimer, obese with sedentary lifestyle. Pt requesting hold off on CT angiogram until tomorrow. Oxygen saturations remain in 80's on HFNC, patient refusing BiPAP. Also with CHF exacerbation receiving IV lasix and afib RVR. Cardiology following. Palliative medicine consultation for goals of care.    Clinical Assessment and Goals of Care:  I have reviewed medical records, discussed with Dr. Manuella Ghazi and RN and met with patient and daughter Sabrina Curry) at bedside to discuss diagnosis, prognosis, GOC, EOL wishes, disposition and options. Patient is intermittently drowsy, but she does wake to voice and able to communicate needs and answer questions appropriately. Patient c/o of nausea despite prn zofran. RN to give phenergan x1. Patient takes phenergan at home. CO2 significantly elevated on ABG. Patient has previously refused BiPAP inpatient. Her oxygen saturations are sustaining 80's on HFNC.   Daughter, Sabrina Curry at bedside is POA and primary caregiver for her mother. The patient also has one son and one step-daughter.   Introduced Palliative Medicine as specialized medical care for people living with serious illness. It focuses on providing relief from the symptoms and stress of a serious illness. The goal is to improve quality of life for both the patient and the  family.  We discussed a brief life review of the patient. Sabrina Curry reports her mother has been grieving the loss of her husband of 17 years since 04-29-18. Following his death, Sabrina Curry moved her mother into her home and has been primary caregiver since. Sabrina Curry has been severely depressed following the death of her husband. She has gained at least 50 lbs since husbands death, and has only been able to ambulate from bed to bathroom. Becomes significantly short of breath with exertion. Misty is extremely afraid to be alone (was with her husband at all times). Sabrina Curry has been an exceptional caregiver for her mother.   The patient's health has continued to decline in the last 2-3 months. Sabrina Curry has been begging her mother to come to the hospital for months.   Discussed events leading up to admission and course of hospitalization including diagnoses, interventions, plan of care. Patient refused CT angio today but is agreeable to have CT angio performed tomorrow.   Explained the severity of her ABG results and respiratory therapist and I strongly urged patient to try wearing BiPAP mask or she will not get better/could potentially die. Sabrina Curry asks her mother if she is ready to die. Sabrina Curry nods her head no, for which Sabrina Curry explains that her mother is not fearful  of death and her belief that she is "ready" but does not want to leave her family behind. Patient is agreeable to take IV ativan and attempt wearing BiPAP this afternoon. Appreciate RT assistance.   Patient and daughter confirm her wishes for DNR/DNI code status. Patient has always told her daughter to let her go, when it's her time to go. Copy of POA placed in chart.   Outside of Lanora's room, Sabrina Curry asks open and honest questions about her mother's prognosis. Explained worry that her oxygen saturations remain low despite aggressive medical management. Sabrina Curry acknowledges that it will be challenging to continue BiPAP mask and she does not anticipate her  mother will tolerate the mask for long. Discussed possible blood clot and the need to anticoagulate/high risk for recurrent clots with sedentary lifestyle. Also concern that her mother will NOT be able to participate with intense physical therapy (oxygen sats went to 60's today when patient sat up on side of bed with therapy). Discussed multiple irreversible conditions contributing to poor prognosis.   Sabrina Curry again shares that her mother has been severely depressed since the loss of her husband and is "ready to go." The patient and family would prefer Sabrina Curry does not die at home (Angela's son is 60 and has experienced many deaths in the family. Her son could not handle his grandmother dying at home).   Briefly discussed comfort route and hospice options. Encouraged Sabrina Curry to discuss and prepare her siblings with their mother's condition.   Most importantly, Sabrina Curry does not want to see her mother "suffer." If her mother takes a turn for the worst, Sabrina Curry will likely opt for comfort measures. She wishes to be contacted at any time of day/night if her mother further declines. Sabrina Curry is very realistic with severity of her mother's condition. She is hopeful she will wear the BiPAP and show improvement, but also wants to honor her mother's wishes--including if she refuses the BiPAP.   Questions and concerns were addressed.  Hard Choices booklet left for review. Therapeutic listening and emotional/spiritual support provided. Reassured of palliative f/u Monday.     SUMMARY OF RECOMMENDATIONS    Patient/daughter confirm DNR/DNI code status.  Continue current plan of care and medical management per attending, including BiPAP attempts with prn ativan.   Patient agreeable with CT angio tomorrow, 12/5.  If patient further declines inpatient, daughter will likely transition to comfort measures only. Daughter, Sabrina Curry wishes to be contacted at any time if her mother takes a turn for the worst. Sabrina Curry shares that  her mother has been severely depressed since the loss of her husband and is not afraid and "ready" for EOL.   Will further discuss hospice options pending clinical course over the weekend. Patient will likely not tolerate aggressive physical therapy at SNF where she will also be isolated from her family. Patient/family would prefer she not die at home. May be a candidate for hospice facility pending clinical course.   PMT provider will return to [facility] on Monday, 12/7 and plans to f/u with patient and daughter.   Discussed with attending.   Code Status/Advance Care Planning:  DNR  Symptom Management:   Ativan 0.4m PO q6h prn anxiety  Consider low dose antidepressant   Palliative Prophylaxis:   Bowel Regimen, Delirium Protocol, Oral Care and Turn Reposition  Psycho-social/Spiritual:   Desire for further Chaplaincy support: yes  Additional Recommendations: Caregiving  Support/Resources, Compassionate Wean Education and Education on Hospice  Prognosis:   Poor prognosis  Discharge Planning: To  Be Determined      Primary Diagnoses: Present on Admission: . Acute on chronic respiratory failure with hypoxia ([place]) . Hypertension . Morbid obesity ([place]) . Obstructive sleep apnea . Thoracic aortic aneurysm ([place]) . COPD exacerbation ([place]) . Acute respiratory failure with hypoxia ([place])   I have reviewed the medical record, interviewed the patient and family, and examined the patient. The following aspects are pertinent.  Past Medical History:  Diagnosis Date  . Anxiety   . Arthritis   . Asthma   . Chronic back pain   . COPD (chronic obstructive pulmonary disease) ([place])   . Edema of both legs    chronic  . GERD (gastroesophageal reflux disease)   . H/O dizziness   . H/O shortness of breath   . Headache(784.0)   . Hx of migraines   . Hypertension   . Knee pain, bilateral   . Obstructive sleep apnea    On CPAP by nasal prong  . Palpitations 12/2012   ? SVT  . Seasonal  allergies   . Stroke [facility])    last stoke Oct 2016  . Thoracic aortic aneurysm [facility]) 12/2012    12/2012: 4.3 cm fusiform aneurysm-ascending thoracic aorta  . Varicose veins of both legs with edema    Social History   Socioeconomic History  . Marital status: Widowed    Spouse name: Not on file  . Number of children: Not on file  . Years of education: Not on file  . Highest education level: Not on file  Occupational History  . Occupation: CNA-retired    Comment: [employer]  Social Needs  . Financial resource strain: Not on file  . Food insecurity    Worry: Not on file    Inability: Not on file  . Transportation needs    Medical: Not on file    Non-medical: Not on file  Tobacco Use  . Smoking status: Never Smoker  . Smokeless tobacco: Never Used  Substance and Sexual Activity  . Alcohol use: No    Alcohol/week: 0.0 standard drinks  . Drug use: No  . Sexual activity: Yes    Birth control/protection: Post-menopausal, Surgical  Lifestyle  . Physical activity    Days per week: Not on file    Minutes per session: Not on file  . Stress: Not on file  Relationships  . Social Herbalist on phone: Not on file    Gets together: Not on file    Attends religious service: Not on file    Active member of club or organization: Not on file    Attends meetings of clubs or organizations: Not on file    Relationship status: Not on file  Other Topics Concern  . Not on file  Social History Narrative  . Not on file   Family History  Problem Relation Age of Onset  . Hypertension Mother   . Diabetes Mother   . Hyperlipidemia Father        Also hypertension and an aneurysm  . Anesthesia problems Neg Hx   . Hypotension Neg Hx   . Pseudochol deficiency Neg Hx   . Malignant hyperthermia Neg Hx    Scheduled Meds: . aspirin EC  162 mg Oral Daily  . Chlorhexidine Gluconate Cloth  6 each Topical Daily  . diltiazem  120 mg Oral Daily  . fluticasone furoate-vilanterol  1  puff Inhalation Daily  . furosemide  40 mg Intravenous Q12H  . isosorbide mononitrate  30 mg Oral Daily  . mouth rinse  15 mL Mouth Rinse BID  . methylPREDNISolone (SOLU-MEDROL) injection  40 mg Intravenous Q12H  . metoprolol tartrate  75 mg Oral BID  . mupirocin ointment  1 application Nasal BID  . nystatin   Topical BID  . pantoprazole (PROTONIX) IV  40 mg Intravenous Q24H  . sodium chloride flush  3 mL Intravenous Q12H  . vitamin B-12  1,000 mcg Oral Daily   Continuous Infusions: . sodium chloride    . heparin 1,200 Units/hr (08/22/19 1602)   PRN Meds:.sodium chloride, acetaminophen **OR** acetaminophen, bisacodyl, iohexol, levalbuterol, LORazepam, ondansetron **OR** ondansetron (ZOFRAN) IV, polyethylene glycol, sodium chloride flush, traMADol Medications Prior to Admission:  Prior to Admission medications   Medication Sig Start Date End Date Taking? Authorizing Provider  albuterol (PROAIR HFA) 108 (90 Base) MCG/ACT inhaler Inhale 1 puff into the lungs every 6 (six) hours as needed for wheezing or shortness of breath.    [provider]  aspirin 81 MG tablet Take 2 tablets (162 mg total) by mouth daily. 06/25/15   Kathie Dike, MD  budesonide-formoterol (SYMBICORT) 160-4.5 MCG/ACT inhaler Inhale 2 puffs into the lungs daily.    [provider]  butalbital-acetaminophen-caffeine (FIORICET) [phone] MG tablet  05/22/19   [provider]  furosemide (LASIX) 20 MG tablet Take 1 tablet (20 mg total) by mouth daily. 07/10/19   Herminio Commons, MD  hydrochlorothiazide (HYDRODIURIL) 25 MG tablet Take 1 tablet (25 mg total) by mouth daily. 07/10/19   Herminio Commons, MD  isosorbide mononitrate (IMDUR) 30 MG 24 hr tablet TAKE (1) TABLET BY MOUTH ONCE DAILY. 07/10/19   Herminio Commons, MD  metoprolol tartrate (LOPRESSOR) 50 MG tablet Take 1.5 tablets (75 mg total) by mouth 2 (two) times daily. 07/09/19 10/07/19  Herminio Commons, MD  OXYGEN Inhale  into the lungs.    [provider]  pantoprazole (PROTONIX) 40 MG tablet Take 1 tablet (40 mg total) by mouth daily. 06/25/15   Kathie Dike, MD  potassium chloride (KLOR-CON) 10 MEQ tablet Take 1 tablet (10 mEq total) by mouth 2 (two) times daily. 07/10/19   Herminio Commons, MD  predniSONE (DELTASONE) 10 MG tablet Take 1 tablet (10 mg total) by mouth daily with breakfast. 01/07/18   Isaac Bliss, Rayford Halsted, MD  promethazine (PHENERGAN) 25 MG tablet Take 25 mg by mouth every 6 (six) hours as needed for nausea or vomiting.    [provider]  vitamin B-12 (CYANOCOBALAMIN) 1000 MCG tablet Take 1 tablet (1,000 mcg total) by mouth daily. 06/25/15   Kathie Dike, MD   Allergies  Allergen Reactions  . Vancomycin Cross Reactors Anaphylaxis   Review of Systems  Constitutional: Positive for activity change.  Respiratory: Positive for shortness of breath.   Gastrointestinal: Positive for nausea.  Psychiatric/Behavioral: The patient is nervous/anxious.    Physical Exam Vitals signs and nursing note reviewed.  Constitutional:      Appearance: She is ill-appearing.  HENT:     Head: Normocephalic and atraumatic.  Cardiovascular:     Rate and Rhythm: Rhythm irregularly irregular.     Comments: Afib. HR 70's Pulmonary:     Comments: Dyspnea at rest. Ativan given & patient tolerating BiPAP during visit.  Skin:    General: Skin is warm and dry.  Neurological:     Mental Status: She is alert, oriented to person, place, and time and easily aroused.     Comments: drowsy  Psychiatric:  Mood and Affect: Mood is anxious.     Vital Signs: BP 110/61   Pulse 85   Temp 98.4 F (36.9 C) (Axillary)   Resp (!) 28   Ht 4' 9.99" (1.473 m)   Wt (!) 142.6 kg   SpO2 (!) 83%   BMI 65.72 kg/m  Pain Scale: 0-10   Pain Score: 2    SpO2: SpO2: (!) 83 % O2 Device:SpO2: (!) 83 % O2 Flow Rate: .O2 Flow Rate (L/min): 15 L/min  IO: Intake/output summary:    Intake/Output Summary (Last 24 hours) at 08/22/2019 1810 Last data filed at 08/22/2019 1602 Gross per 24 hour  Intake 494.78 ml  Output 1300 ml  Net -805.22 ml    LBM: Last BM Date: 08/22/19 Baseline Weight: Weight: (!) 148.4 kg Most recent weight: Weight: (!) 142.6 kg     Palliative Assessment/Data: PPS 30%     Time In: 1500 Time Out: 1640 Time Total: 100 min Greater than 50%  of this time was spent counseling and coordinating care related to the above assessment and plan.  Signed by:  Ihor Dow, DNP, FNP-C Palliative Medicine Team  Phone: [phone] Fax: [phone]   Please contact Palliative Medicine Team phone at [phone] for questions and concerns.  For individual provider: See Shea Evans

## 2019-08-22 NOTE — Progress Notes (Signed)
Physical Therapy Treatment Patient Details Name: Sabrina Curry MRN: [id number] DOB: Sep 22, 1950 Today's Date: 08/22/2019    History of Present Illness Sabrina Curry is a 68 y.o. female with medical history significant of asthma, COPD, GERD, lymphedema, anxiety, depression, obstructive sleep apnea who presented to the ER with worsening shortness of breath.  Patient has had relatively sudden onset shortness of breath over the last 2 days.  Usually on 2 L of home oxygen.  Noted to be satting in the 70s on oxygen at home and had to be placed on 15 L nonrebreather.  Denies any history of chest pain.  Has had worsening lower extremity edema over the last several weeks.  Also reports having abdominal wall edema.  Usually ambulates with a walker at home but has had significant decline in activity.  Has had orthopnea as well.  Does have COPD and obstructive sleep apnea and is supposed to be on CPAP at home but is unable to use it due to claustrophobia and adamantly refuses it in the ER as well.  She is also a DNR and does not wish any aggressive interventions.    PT Comments    Patient demonstrates less pain and improvement for moving BLE during supine to sitting, tolerated 4 trials of sit to stands and taking steps at bedside with frequent rest breaks and SpO2 between 85-89% while on 15 LPM high flow O2, good return for completing BLE ROM/strengthening exercises while seated at bedside with verbal cues and demonstration.  It appears that patient breaths better seated with SpO2 increasing to 90-92% while seated at bedside and tolerated staying up in chair with her daughter present in room - RN aware.  Patient will benefit from continued physical therapy in hospital and recommended venue below to increase strength, balance, endurance for safe ADLs and gait.   Follow Up Recommendations  SNF;Supervision for mobility/OOB;Supervision - Intermittent     Equipment Recommendations  None recommended by PT     Recommendations for Other Services       Precautions / Restrictions Precautions Precautions: Fall Restrictions Weight Bearing Restrictions: No    Mobility  Bed Mobility Overal bed mobility: Needs Assistance Bed Mobility: Supine to Sit     Supine to sit: Min assist     General bed mobility comments: demonstrates improvement for moving BLE during supine to sitting  Transfers Overall transfer level: Needs assistance Equipment used: Rolling walker (2 wheeled) Transfers: Sit to/from [facility] Sit to Stand: Min assist Stand pivot transfers: Min assist       General transfer comment: slow labored movement  Ambulation/Gait Ambulation/Gait assistance: Mod assist Gait Distance (Feet): 5 Feet Assistive device: Rolling walker (2 wheeled) Gait Pattern/deviations: Decreased step length - right;Decreased step length - left;Decreased stride length Gait velocity: slow   General Gait Details: limited to 5-6 slow labored side steps at bedside due to fatigue/BLE weakness with SpO2 maintaining between 85-87% while on 15 LPM high flow O2   Stairs             Wheelchair Mobility    Modified Rankin (Stroke Patients Only)       Balance Overall balance assessment: Needs assistance Sitting-balance support: No upper extremity supported Sitting balance-Leahy Scale: Good Sitting balance - Comments: seated at EOB   Standing balance support: During functional activity;Bilateral upper extremity supported Standing balance-Leahy Scale: Fair Standing balance comment: using RW  Cognition Arousal/Alertness: Awake/alert Behavior During Therapy: WFL for tasks assessed/performed Overall Cognitive Status: Within Functional Limits for tasks assessed                                        Exercises General Exercises - Lower Extremity Long Arc Quad: Seated;AROM;Strengthening;Both;10 reps Hip Flexion/Marching:  Seated;AROM;Strengthening;Both;10 reps Toe Raises: Seated;AROM;Strengthening;Both;15 reps Heel Raises: Seated;AROM;Strengthening;Both;15 reps    General Comments        Pertinent Vitals/Pain Pain Assessment: Faces Faces Pain Scale: Hurts little more Pain Location: BLE possibly due to swelling Pain Descriptors / Indicators: Aching;Sore    Home Living                      Prior Function            PT Goals (current goals can now be found in the care plan section) Acute Rehab PT Goals Patient Stated Goal: return home with family to assist PT Goal Formulation: With patient Time For Goal Achievement: 09/04/19 Potential to Achieve Goals: Good Progress towards PT goals: Progressing toward goals    Frequency    Min 3X/week      PT Plan Current plan remains appropriate    Co-evaluation              AM-PAC PT "6 Clicks" Mobility   Outcome Measure  Help needed turning from your back to your side while in a flat bed without using bedrails?: A Little Help needed moving from lying on your back to sitting on the side of a flat bed without using bedrails?: A Little Help needed moving to and from a bed to a chair (including a wheelchair)?: A Lot Help needed standing up from a chair using your arms (e.g., wheelchair or bedside chair)?: A Little Help needed to walk in hospital room?: A Lot Help needed climbing 3-5 steps with a railing? : Total 6 Click Score: 14    End of Session Equipment Utilized During Treatment: Oxygen Activity Tolerance: Patient tolerated treatment well;Patient limited by fatigue Patient left: in chair;with call bell/phone within reach Nurse Communication: Mobility status PT Visit Diagnosis: Unsteadiness on feet (R26.81);Other abnormalities of gait and mobility (R26.89);Muscle weakness (generalized) (M62.81)     Time: 2671-2458 PT Time Calculation (min) (ACUTE ONLY): 32 min  Charges:  $Therapeutic Exercise: 8-22 mins $Therapeutic  Activity: 8-22 mins                     1:59 PM, 08/22/19 Lonell Grandchild, MPT Physical Therapist with [facility] [phone] office [phone] mobile phone

## 2019-08-22 NOTE — Progress Notes (Signed)
Pt placed on Bipap at this time to help with the SPO2 and CO2. She was given ativan so she could tolerate wearing the mask. Her SPO2 was only in the mid 90s with 12/6 and 100%.

## 2019-08-22 NOTE — Care Management Important Message (Signed)
Important Message  Patient Details  Name: Sabrina Curry MRN: [id number] Date of Birth: 1951-03-30   Medicare Important Message Given:  Yes     Tommy Medal 08/22/2019, 2:39 PM

## 2019-08-22 NOTE — Progress Notes (Signed)
OT Cancellation Note  Patient Details Name: Sabrina Curry MRN: [id number] DOB: [birth year]   Cancelled Treatment:    Reason Eval/Treat Not Completed: Patient at procedure or test/ unavailable. Pt beginning breakfast this am upon OT arrival. Discussed PLOF for ADL completion with pt who reports daughter is her caregiver and assists with all ADLs before going to work daily. Pt waits on daughter's arrival home to complete any other tasks. Pt is unsure of rehab, is deferring to her daughter for decision. Will check back on pt as schedule permits, if pt unable to be seen before discharge defer OT evaluation to SNF or [facility] OT services.   Guadelupe Sabin, OTR/L  [phone] 08/22/2019, 8:20 AM

## 2019-08-22 NOTE — Progress Notes (Signed)
Progress Note  Patient Name: Sabrina Curry Date of Encounter: 08/22/2019  Primary Cardiologist: Kate Sable, MD   Subjective   Ongoing SOB  Inpatient Medications    Scheduled Meds: . aspirin EC  162 mg Oral Daily  . Chlorhexidine Gluconate Cloth  6 each Topical Daily  . diltiazem  30 mg Oral Q6H  . fluticasone furoate-vilanterol  1 puff Inhalation Daily  . furosemide  40 mg Intravenous Q12H  . influenza vaccine adjuvanted  0.5 mL Intramuscular Tomorrow-1000  . isosorbide mononitrate  30 mg Oral Daily  . methylPREDNISolone (SOLU-MEDROL) injection  40 mg Intravenous Q12H  . metoprolol tartrate  75 mg Oral BID  . mupirocin ointment  1 application Nasal BID  . nystatin   Topical BID  . pantoprazole (PROTONIX) IV  40 mg Intravenous Q24H  . sodium chloride flush  3 mL Intravenous Q12H  . vitamin B-12  1,000 mcg Oral Daily   Continuous Infusions: . sodium chloride    . diltiazem (CARDIZEM) infusion Stopped (08/21/19 1021)  . heparin 1,200 Units/hr (08/22/19 0752)   PRN Meds: sodium chloride, acetaminophen **OR** acetaminophen, bisacodyl, iohexol, levalbuterol, LORazepam, ondansetron **OR** ondansetron (ZOFRAN) IV, polyethylene glycol, sodium chloride flush, traMADol   Vital Signs    Vitals:   08/22/19 0500 08/22/19 0635 08/22/19 0700 08/22/19 0750  BP:  (!) 150/113 126/88   Pulse:   88 94  Resp:   19 (!) 23  Temp:    98.1 F (36.7 C)  TempSrc:    Oral  SpO2:   (!) 86% (!) 87%  Weight: (!) 142.6 kg     Height:        Intake/Output Summary (Last 24 hours) at 08/22/2019 0815 Last data filed at 08/22/2019 0752 Gross per 24 hour  Intake 497.81 ml  Output 2700 ml  Net -2202.19 ml   Last 3 Weights 08/22/2019 08/21/2019 09/18/2019  Weight (lbs) 314 lb 6 oz 319 lb 0.1 oz 327 lb 2.6 oz  Weight (kg) 142.6 kg 144.7 kg 148.4 kg      Telemetry    Rate controlled afib - Personally Reviewed  ECG    n/a- Personally Reviewed  Physical Exam   GEN: No acute distress.    Neck: elevated JVD Cardiac: irreg, n/r/g,no jvd Respiratory: Clear to auscultation bilaterally. GI: Soft, nontender, non-distended  MS: 1+ bilateral LE edema; No deformity. Neuro:  Nonfocal  Psych: Normal affect   Labs    High Sensitivity Troponin:   Recent Labs  Lab 08/19/2019 1907 08/26/2019 2119 08/21/19 0649 08/21/19 0958 08/21/19 1347  TROPONINIHS 6 8 9 9 10       Chemistry Recent Labs  Lab 08/19/2019 1907 08/21/19 0649 08/22/19 0526  NA 144 144 143  K 5.2* 4.4 4.3  CL 99 94* 89*  CO2 35* 40* 41*  GLUCOSE 115* 139* 123*  BUN 20 21 25*  CREATININE 0.95 1.09* 1.10*  CALCIUM 9.2 8.9 9.1  GFRNONAA >60 53* 52*  GFRAA >60 >60 >60  ANIONGAP 10 10 13      Hematology Recent Labs  Lab 09/03/2019 1907 08/21/19 0649 08/22/19 0526  WBC 6.5 8.8 7.5  RBC 3.80* 3.52*  3.51* 3.46*  HGB 11.9* 11.1* 10.9*  HCT 43.5 39.2 38.2  MCV 114.5* 111.4* 110.4*  MCH 31.3 31.5 31.5  MCHC 27.4* 28.3* 28.5*  RDW 15.4 15.3 15.2  PLT 118* 124* 119*    BNP Recent Labs  Lab 09/13/2019 1706  BNP 255.0*     DDimer  Recent Labs  Lab 09/07/2019 1716  DDIMER 2.99*     Radiology    Dg Chest Portable 1 View  Result Date: 09/14/2019 CLINICAL DATA:  Shortness of breath EXAM: PORTABLE CHEST 1 VIEW COMPARISON:  01/05/2018 FINDINGS: Chronic elevation of the right diaphragm. Cardiomegaly with mild central congestion. Patchy airspace opacities bilaterally. No pleural effusion or pneumothorax. IMPRESSION: 1. Patchy bilateral airspace opacities, possible multifocal infection, consider atypical or viral process 2. Cardiomegaly with mild central congestion Electronically Signed   By: Donavan Foil M.D.   On: 08/19/2019 17:18    Cardiac Studies     Patient Profile     Sabrina Curry is a 68 y.o. female with a hx of COPD on home O2, morbid obesity who is being seen today for the evaluation of SOB at the request of Dr Manuella Ghazi.  Assessment & Plan    1. Acute on chronic hypoxic/hypercapneic  respiratory failure - marked hypoxia and hypercapnea on presentation. Patient refused bipap, she is also DNR. Has been on NRB - potential component of both COPD exacerbation and fluid overload. With elevated Ddimer in severely obese sedentary patient with marked hypoxia a CT PE was considered but patient was too unstable from respiratory standpoint. WOuld obtian CT PE when able, she is on anticoag in setting of new onset afib at this time with hep gtt.   - COPD management per primary team  2. Acute on chronic diastolic HF - echo LVEF 45-99%, indet diastolic function.Severe LAE - from volume standpoint she is on lasix 48m IV bid. Negative 2.2 L yesterday and 4.1 L since admission. Mild uptrend in Cr and BUN. Remains volume overloaded, continue IV diuretics today.  - of note chronic LE edema, poor gauge for her total fluid status -significant ongoing hypoxia.    3. New diagnosis of afib - currently on lopressor 78mbid for rate control, dilt 3087mvery 6 hours. - - CHADS2Vasc score is (CHF, HTN, age 89,38troke x2, gender) is 6, 79tarted on hep gtt    - started on dilt gtt for rate control, remains on her home lopressor 62m81md.Started oral dilt 30mg59mry 6 hours, dilt gtt has been weaned off - consolidate dilt to 120mg 42my - with ongoing hypxoia would anticipate at least mild physiologlical tachycardia.     - CHADS2Vasc score is (CHF, HTN, age 89, st76ke x2, gender) is 6, sta68ted on hep gtt. Continue IV hep for now,once stable for CT PE and PE is excluded I would transition to oral eliquis at that time.    4. COPD on home O2 - management per primary team  For questions or updates, please contact [facility] consult [email or web address] for contact info under        Signed, BranchCarlyle Dolly12/12/2018, 8:15 AM

## 2019-08-22 NOTE — Progress Notes (Addendum)
ANTICOAGULATION CONSULT NOTE -   Pharmacy Consult for heparin infusion Indication: pulmonary embolus  Allergies  Allergen Reactions  . Vancomycin Cross Reactors Anaphylaxis    Patient Measurements: Height: 4' 9.99" (147.3 cm) Weight: (!) 314 lb 6 oz (142.6 kg) IBW/kg (Calculated) : 40.88 Heparin Dosing Weight: HEPARIN DW (KG): 80.3 HEPARIN DW (KG): 80.3 HEPARIN DW (KG): 80.3  Vital Signs: Temp: 98.4 F (36.9 C) (12/04 0400) Temp Source: Oral (12/04 0400) BP: 150/113 (12/04 0635) Pulse Rate: 77 (12/04 0330)  Labs: Recent Labs    09/12/2019 1907  09/11/2019 2119 08/21/19 0649 08/21/19 0958 08/21/19 1347 08/22/19 0526  HGB 11.9*  --   --  11.1*  --   --  10.9*  HCT 43.5  --   --  39.2  --   --  38.2  PLT 118*  --   --  124*  --   --  119*  APTT  --   --  33  --   --   --   --   HEPARINUNFRC  --    < > <0.10* 0.62  --  0.70 0.88*  CREATININE 0.95  --   --  1.09*  --   --  1.10*  TROPONINIHS 6  --  8 9 9 10   --    < > = values in this interval not displayed.    Estimated Creatinine Clearance: 64.8 mL/min (A) (by C-G formula based on SCr of 1.1 mg/dL (H)).   Medical History: Past Medical History:  Diagnosis Date  . Anxiety   . Arthritis   . Asthma   . Chronic back pain   . COPD (chronic obstructive pulmonary disease) ([place])   . Edema of both legs    chronic  . GERD (gastroesophageal reflux disease)   . H/O dizziness   . H/O shortness of breath   . Headache(784.0)   . Hx of migraines   . Hypertension   . Knee pain, bilateral   . Obstructive sleep apnea    On CPAP by nasal prong  . Palpitations 12/2012   ? SVT  . Seasonal allergies   . Stroke [facility])    last stoke Oct 2016  . Thoracic aortic aneurysm [facility]) 12/2012    12/2012: 4.3 cm fusiform aneurysm-ascending thoracic aorta  . Varicose veins of both legs with edema       Assessment: Pharmacy consulted to dose heparin for this 68 yo female with possible PE. Her D-Dimer is elevated at 2.99ug/mL-FEU.  She  wasn't taking any prior anti-coagulants prior to admission.    Heparin level is therapeutic at 0.88  Goal of Therapy:  Heparin level 0.3-0.7 units/ml Monitor platelets by anticoagulation protocol: Yes   Plan:  Decrease heparin infusion to 1200 units/hr Check anti-Xa level in 6 hours and daily while on heparin. Continue to monitor H&H and platelets.  Margot Ables, PharmD Clinical Pharmacist 08/22/2019 7:43 AM

## 2019-08-22 NOTE — TOC Progression Note (Signed)
Transition of Care [facility]) - Progression Note    Patient Details  Name: Sabrina Curry MRN: [id number] Date of Birth: 13-Jul-1951  Transition of [facility]) CM/SW Contact  Adonai Helzer, Chauncey Reading, RN Phone Number: 08/22/2019, 11:44 AM  Clinical Narrative:   Discussed patient during progression rounds. Palliative consulted for goals of care.   Will wait on SNF referral. PASSR obtained and FL2 done.    Expected Discharge Plan: [place] Barriers to Discharge: Continued Medical Work up, SNF Pending bed offer  Expected Discharge Plan and Services Expected Discharge Plan: [place]   Discharge Planning Services: CM Consult   Living arrangements for the past 2 months: Single Family Home                                       Social Determinants of Health (SDOH) Interventions    Readmission Risk Interventions No flowsheet data found.

## 2019-08-22 NOTE — Progress Notes (Signed)
PROGRESS NOTE    Sabrina Curry  WLN:[id number] DOB: 1950-09-29 DOA: 08/30/2019 PCP: Rosita Fire, MD   Brief Narrative:  Per HPI: Sabrina Curry a 68 y.o.femalewith medical history significant ofasthma, COPD, GERD, lymphedema, anxiety, depression, obstructive sleep apnea who presented to the ER with worsening shortness of breath.Patient has had relatively sudden onset shortness of breath over the last 2 days. Usually on 2 L of home oxygen. Noted to be satting in the 70s on oxygen at home and had to be placed on 15 L nonrebreather. Denies any history of chest pain. Has had worsening lower extremity edema over the last several weeks. Also reports having abdominal wall edema. Usually ambulates with a walker at home but has had significant decline in activity. Has had orthopnea as well. Does have COPD and obstructive sleep apnea and is supposed to be on CPAP at home but is unable to use it due to claustrophobia and adamantly refuses it in the ER as well. She is also a DNR and does not wish any aggressive interventions.  12/3: Patient was admitted with acute on chronic hypoxemic and hypercapnic respiratory failure in severe respiratory distress.  She refuses BiPAP or intubation and is DNR.  She continues to have significant nasal cannula oxygen requirements and is nearly on 10 L.  She was started on Cardizem drip as well as heparin for new onset atrial fibrillation with RVR as well as some IV Lasix for acute CHF exacerbation with preserved EF.  She appears to be diuresing quite well and has almost 1.9 L of net negative fluid balance thus far.  We will continue diuresis with IV Lasix.  [name] cardiology consultation with 2D echocardiogram pending.  She has been started on oral Cardizem on top of her home metoprolol and we will plan to try and wean IV Cardizem as tolerated.  Anticipate CT chest angiogram for PE evaluation when stable enough to do so.  PT/OT evaluation pending and will likely  require PT on discharge.  Follow a.m. labs.  12/4: Patient has been evaluated by PT with recommendations for SNF on discharge.  She has been weaned off of her IV Cardizem with improved heart rate control on oral Cardizem as well as metoprolol.  Cardizem has been consolidated to 120 mg daily by cardiology.  Patient still remains fairly hypoxemic and short of breath and on high flow nasal cannula oxygen.  Total of approximately 4 L net negative fluid balance noted thus far.  2D echocardiogram with LVEF 55-60% and no RV strain noted.  Patient request to have CT angiogram study tomorrow if possible.  She remains on heparin drip.  Palliative care consulted today for goals of care discussion.  Assessment & Plan:   Active Problems:   Morbid obesity ([place])   Hypertension   Obstructive sleep apnea   Thoracic aortic aneurysm (HCC)   COPD exacerbation (HCC)   Acute respiratory failure with hypoxia (HCC)   Acute on chronic respiratory failure with hypoxia (HCC)   Acute on chronic combined respiratory failure -Patient with elevated D-dimer and noted to be obese and sedentary -Recommended CT angiogram today and patient requests that we hold off until tomorrow -Maintain on heparin drip for now -Patient does not appear to be weaning quite well off high flow nasal cannula, palliative care consultation as she is DNR and refuses to wear BiPAP-need to address goals of care  Acute on chronic diastolic CHF exacerbation -2D echocardiogram with LVEF 55-60% and indeterminate diastolic function -Continue Lasix 40 mg IV  twice daily as patient has been -4.1 L since admission -Monitor a.m. labs  New onset atrial fibrillation with RVR-currently controlled -Patient is currently on Lopressor 75 mg twice daily as well as diltiazem 30 mg every 6 hours which will be consolidated to 120 mg daily per cardiology -CHA2DS2-VASc score is 6 and patient will need to remain on oral anticoagulation on time of discharge, continue  heparin drip for now  COPD with chronic hypoxemia -Continue on Solu-Medrol as ordered -Breathing treatments for shortness of breath and wheezing  Obstructive sleep apnea -Refuses CPAP or BiPAP  Chronic macrocytic anemia -Appears stable on repeat CBC -Noted to have iron deficiency anemia and received Feraheme on 12/3  Mild thrombocytopenia-stable -Continue to monitor on heparin drip  Anxiety -Continue Ativan as needed  GERD -Continue IV PPI daily while on Solu-Medrol  Chronic physical deconditioning/sedentary -PT has recommended SNF on discharge  DVT prophylaxis: Heparin drip Code Status: DNR Family Communication: None at bedside Disposition Plan: Plan to transfer to telemetry if stable.  CT PE study in a.m. per patient request.  Continue heparin drip for now.  Palliative care consultation.   Consultants:   Cardiology  Palliative care  Procedures:   None  Antimicrobials:  Anti-infectives (From admission, onward)   Start     Dose/Rate Route Frequency Ordered Stop   09/14/2019 2200  doxycycline (VIBRA-TABS) tablet 100 mg     100 mg Oral  Once 09/08/2019 2153 09/07/2019 2333       Subjective: Patient seen and evaluated today with ongoing shortness of breath and hypoxemia noted on high flow nasal cannula oxygen.  No acute concerns or events noted overnight.  She has been weaned off Cardizem drip with stable heart rate control noted this morning.  Objective: Vitals:   08/22/19 0750 08/22/19 0900 08/22/19 0926 08/22/19 0957  BP:  115/72 115/72   Pulse: 94 78  96  Resp: (!) 23 (!) 29    Temp: 98.1 F (36.7 C)     TempSrc: Oral     SpO2: (!) 87% (!) 86%  (!) 86%  Weight:      Height:        Intake/Output Summary (Last 24 hours) at 08/22/2019 1005 Last data filed at 08/22/2019 5456 Gross per 24 hour  Intake 500.81 ml  Output 2700 ml  Net -2199.19 ml   Filed Weights   09/16/2019 2242 08/21/19 0500 08/22/19 0500  Weight: (!) 148.4 kg (!) 144.7 kg (!) 142.6 kg     Examination:  General exam: Appears calm and comfortable, obese Respiratory system: Poor breath sounds noted bilaterally, currently on high flow nasal cannula oxygen Cardiovascular system: S1 & S2 heard, irregular. No JVD, murmurs, rubs, gallops or clicks. No pedal edema. Gastrointestinal system: Abdomen is nondistended, soft and nontender. No organomegaly or masses felt. Normal bowel sounds heard. Central nervous system: Alert and oriented. No focal neurological deficits. Extremities: Chronic lower extremity edema, wrapped Skin: No rashes, lesions or ulcers Psychiatry: Appears somewhat anxious    Data Reviewed: I have personally reviewed following labs and imaging studies  CBC: Recent Labs  Lab 08/31/2019 1907 08/21/19 0649 08/22/19 0526  WBC 6.5 8.8 7.5  NEUTROABS 4.8  --   --   HGB 11.9* 11.1* 10.9*  HCT 43.5 39.2 38.2  MCV 114.5* 111.4* 110.4*  PLT 118* 124* 256*   Basic Metabolic Panel: Recent Labs  Lab 08/23/2019 1907 08/21/19 0649 08/22/19 0526  NA 144 144 143  K 5.2* 4.4 4.3  CL 99 94*  89*  CO2 35* 40* 41*  GLUCOSE 115* 139* 123*  BUN 20 21 25*  CREATININE 0.95 1.09* 1.10*  CALCIUM 9.2 8.9 9.1  MG  --   --  1.9   GFR: Estimated Creatinine Clearance: 64.8 mL/min (A) (by C-G formula based on SCr of 1.1 mg/dL (H)). Liver Function Tests: No results for input(s): AST, ALT, ALKPHOS, BILITOT, PROT, ALBUMIN in the last 168 hours. No results for input(s): LIPASE, AMYLASE in the last 168 hours. No results for input(s): AMMONIA in the last 168 hours. Coagulation Profile: No results for input(s): INR, PROTIME in the last 168 hours. Cardiac Enzymes: No results for input(s): CKTOTAL, CKMB, CKMBINDEX, TROPONINI in the last 168 hours. BNP (last 3 results) No results for input(s): PROBNP in the last 8760 hours. HbA1C: No results for input(s): HGBA1C in the last 72 hours. CBG: No results for input(s): GLUCAP in the last 168 hours. Lipid Profile: No results for  input(s): CHOL, HDL, LDLCALC, TRIG, CHOLHDL, LDLDIRECT in the last 72 hours. Thyroid Function Tests: Recent Labs    08/19/2019 1907  TSH 1.299   Anemia Panel: Recent Labs    08/21/19 0649  VITAMINB12 217  FOLATE 23.2  FERRITIN 31  TIBC 405  IRON 27*  RETICCTPCT 2.9   Sepsis Labs: Recent Labs  Lab 09/14/2019 1907 09/01/2019 2119  LATICACIDVEN 1.3 0.9    Recent Results (from the past 240 hour(s))  Blood culture (routine x 2)     Status: None (Preliminary result)   Collection Time: 08/19/2019  5:37 PM   Specimen: BLOOD RIGHT HAND  Result Value Ref Range Status   Specimen Description BLOOD RIGHT HAND  Final   Special Requests   Final    BOTTLES DRAWN AEROBIC ONLY Blood Culture adequate volume   Culture   Final    NO GROWTH 2 DAYS Performed at [facility], [address], [place], [place] [address]    Report Status PENDING  Incomplete  Blood culture (routine x 2)     Status: None (Preliminary result)   Collection Time: 08/31/2019  5:42 PM   Specimen: BLOOD RIGHT HAND  Result Value Ref Range Status   Specimen Description BLOOD RIGHT HAND  Final   Special Requests   Final    BOTTLES DRAWN AEROBIC ONLY Blood Culture adequate volume   Culture   Final    NO GROWTH 2 DAYS Performed at [facility], [address] [place], [place] [address]    Report Status PENDING  Incomplete  SARS Coronavirus 2 by RT PCR (hospital order, performed in [place] hospital lab) Nasopharyngeal Nasopharyngeal Swab     Status: None   Collection Time: 09/17/2019  5:58 PM   Specimen: Nasopharyngeal Swab  Result Value Ref Range Status   SARS Coronavirus 2 NEGATIVE NEGATIVE Final    Comment: (NOTE) SARS-CoV-2 target nucleic acids are NOT DETECTED. The SARS-CoV-2 RNA is generally detectable in upper and lower respiratory specimens during the acute phase of infection. The lowest concentration of SARS-CoV-2 viral copies this assay can detect is 250 copies / mL. A negative result does not preclude  SARS-CoV-2 infection and should not be used as the sole basis for treatment or other patient management decisions.  A negative result may occur with improper specimen collection / handling, submission of specimen other than nasopharyngeal swab, presence of viral mutation(s) within the areas targeted by this assay, and inadequate number of viral copies (<250 copies / mL). A negative result must be combined with clinical observations,  patient history, and epidemiological information. Fact Sheet for Patients:   StrictlyIdeas.no Fact Sheet for Healthcare Providers: BankingDealers.co.za This test is not yet approved or cleared  by the [place] FDA and has been authorized for detection and/or diagnosis of SARS-CoV-2 by FDA under an Emergency Use Authorization (EUA).  This EUA will remain in effect (meaning this test can be used) for the duration of the COVID-19 declaration under Section 564(b)(1) of the Act, 21 U.S.C. section 360bbb-3(b)(1), unless the authorization is terminated or revoked sooner. Performed at [facility], [address] [place], [place] [address]   MRSA PCR Screening     Status: Abnormal   Collection Time: 08/21/19  1:38 AM   Specimen: Nasal Mucosa; Nasopharyngeal  Result Value Ref Range Status   MRSA by PCR POSITIVE (A) NEGATIVE Final    Comment:        The GeneXpert MRSA Assay (FDA approved for NASAL specimens only), is one component of a comprehensive MRSA colonization surveillance program. It is not intended to diagnose MRSA infection nor to guide or monitor treatment for MRSA infections. RESULT CALLED TO, READ BACK BY AND VERIFIED WITH: LASHLEY S. AT 0816AM ON [id number] BY THOMPSON S. Performed at [facility], [address], [place], [place] [address]          Radiology Studies: Dg Chest Portable 1 View  Result Date: 09/06/2019 CLINICAL DATA:  Shortness of breath EXAM: PORTABLE CHEST 1 VIEW  COMPARISON:  01/05/2018 FINDINGS: Chronic elevation of the right diaphragm. Cardiomegaly with mild central congestion. Patchy airspace opacities bilaterally. No pleural effusion or pneumothorax. IMPRESSION: 1. Patchy bilateral airspace opacities, possible multifocal infection, consider atypical or viral process 2. Cardiomegaly with mild central congestion Electronically Signed   By: Donavan Foil M.D.   On: 09/17/2019 17:18        Scheduled Meds:  aspirin EC  162 mg Oral Daily   Chlorhexidine Gluconate Cloth  6 each Topical Daily   diltiazem  120 mg Oral Daily   fluticasone furoate-vilanterol  1 puff Inhalation Daily   furosemide  40 mg Intravenous Q12H   isosorbide mononitrate  30 mg Oral Daily   mouth rinse  15 mL Mouth Rinse BID   methylPREDNISolone (SOLU-MEDROL) injection  40 mg Intravenous Q12H   metoprolol tartrate  75 mg Oral BID   mupirocin ointment  1 application Nasal BID   nystatin   Topical BID   pantoprazole (PROTONIX) IV  40 mg Intravenous Q24H   sodium chloride flush  3 mL Intravenous Q12H   vitamin B-12  1,000 mcg Oral Daily   Continuous Infusions:  sodium chloride     heparin 1,200 Units/hr (08/22/19 0752)     LOS: 1 day    Time spent: 30 minutes    Pershing Skidmore Darleen Crocker, DO [facility] Pager [phone]  If 7PM-7AM, please contact night-coverage [email or web address] Password [email or web address] 08/22/2019, 10:05 AM

## 2019-08-22 NOTE — Progress Notes (Signed)
Pt requested to be helped back to bed. While assisting pt to bed O2 saturations noted to drop down to low 70's. Pt stated some difficulty breathing but stated she felt fine. Informed pt that her O2 saturations are worrisome. Pt states again that she is not wanting to be on bipap. Asked pt if she would be willing to try a nasal cpap.  Pt and daughter requested to see what the nasal cpap looked like. Anise Salvo, RT and she gave further counseling. Pt currently on 15 L HFNC and saturating at 85%. Informed by Anderson Malta, RT that pt had decided she did not want to try nasal cpap. Anderson Malta, RT will attempt Heated HFNC. Jennifer notified Dr. Manuella Ghazi of plan.

## 2019-08-23 ENCOUNTER — Inpatient Hospital Stay (HOSPITAL_COMMUNITY): Payer: 59

## 2019-08-23 DIAGNOSIS — F411 Generalized anxiety disorder: Secondary | ICD-10-CM

## 2019-08-23 LAB — BLOOD GAS, ARTERIAL
Acid-Base Excess: 23.7 mmol/L — ABNORMAL HIGH (ref 0.0–2.0)
Bicarbonate: 44.8 mmol/L — ABNORMAL HIGH (ref 20.0–28.0)
FIO2: 64
O2 Saturation: 81.3 %
Patient temperature: 36.7
pCO2 arterial: 107 mmHg (ref 32.0–48.0)
pH, Arterial: 7.3 — ABNORMAL LOW (ref 7.350–7.450)
pO2, Arterial: 55.2 mmHg — ABNORMAL LOW (ref 83.0–108.0)

## 2019-08-23 LAB — CBC
HCT: 44.4 % (ref 36.0–46.0)
Hemoglobin: 12.3 g/dL (ref 12.0–15.0)
MCH: 31.4 pg (ref 26.0–34.0)
MCHC: 27.7 g/dL — ABNORMAL LOW (ref 30.0–36.0)
MCV: 113.3 fL — ABNORMAL HIGH (ref 80.0–100.0)
Platelets: 118 10*3/uL — ABNORMAL LOW (ref 150–400)
RBC: 3.92 MIL/uL (ref 3.87–5.11)
RDW: 15.2 % (ref 11.5–15.5)
WBC: 7.1 10*3/uL (ref 4.0–10.5)
nRBC: 0 % (ref 0.0–0.2)

## 2019-08-23 LAB — BASIC METABOLIC PANEL
Anion gap: 7 (ref 5–15)
BUN: 28 mg/dL — ABNORMAL HIGH (ref 8–23)
CO2: 49 mmol/L — ABNORMAL HIGH (ref 22–32)
Calcium: 9.1 mg/dL (ref 8.9–10.3)
Chloride: 85 mmol/L — ABNORMAL LOW (ref 98–111)
Creatinine, Ser: 1.15 mg/dL — ABNORMAL HIGH (ref 0.44–1.00)
GFR calc Af Amer: 57 mL/min — ABNORMAL LOW (ref >60)
GFR calc non Af Amer: 50 mL/min — ABNORMAL LOW (ref >60)
Glucose, Bld: 121 mg/dL — ABNORMAL HIGH (ref 70–99)
Potassium: 4.6 mmol/L (ref 3.5–5.1)
Sodium: 141 mmol/L (ref 135–145)

## 2019-08-23 LAB — MAGNESIUM: Magnesium: 1.9 mg/dL (ref 1.7–2.4)

## 2019-08-23 LAB — HEPARIN LEVEL (UNFRACTIONATED): Heparin Unfractionated: 0.53 IU/mL (ref 0.30–0.70)

## 2019-08-23 LAB — PROCALCITONIN: Procalcitonin: <0.1 ng/mL

## 2019-08-23 MED ORDER — BUDESONIDE 0.25 MG/2ML IN SUSP
0.2500 mg | Freq: Two times a day (BID) | RESPIRATORY_TRACT | Status: DC
  Administered 2019-08-23 – 2019-08-25 (×4): 0.25 mg via RESPIRATORY_TRACT
  Filled 2019-08-23 (×4): qty 2

## 2019-08-23 MED ORDER — APIXABAN 2.5 MG PO TABS: 2.5000 mg | ORAL_TABLET | Freq: Two times a day (BID) | ORAL | Status: DC

## 2019-08-23 MED ORDER — METHYLPREDNISOLONE SODIUM SUCC 125 MG IJ SOLR
80.0000 mg | Freq: Two times a day (BID) | INTRAMUSCULAR | Status: DC
  Administered 2019-08-23 – 2019-08-24 (×2): 80 mg via INTRAVENOUS
  Filled 2019-08-23 (×2): qty 2

## 2019-08-23 MED ORDER — IBUPROFEN 800 MG PO TABS
800.0000 mg | ORAL_TABLET | Freq: Four times a day (QID) | ORAL | Status: DC | PRN
  Administered 2019-08-23 – 2019-08-24 (×3): 800 mg via ORAL
  Filled 2019-08-23 (×3): qty 1

## 2019-08-23 MED ORDER — APIXABAN 5 MG PO TABS
5.0000 mg | ORAL_TABLET | Freq: Two times a day (BID) | ORAL | Status: DC
  Administered 2019-08-23 – 2019-08-25 (×5): 5 mg via ORAL
  Filled 2019-08-23 (×5): qty 1

## 2019-08-23 NOTE — Progress Notes (Signed)
Patient transported to CT by transport tech.

## 2019-08-23 NOTE — Progress Notes (Signed)
Patient returned to room and in stable condition. Reported to this RN that patient desat into the high 70s with activity and lying flat. Patient's oxygen sats increased back into the 90s upon returning to the room.  Celestia Khat, RN

## 2019-08-23 NOTE — Progress Notes (Signed)
ANTICOAGULATION CONSULT NOTE -   Pharmacy Consult for heparin infusion Indication: pulmonary embolus  Allergies  Allergen Reactions  . Vancomycin Cross Reactors Anaphylaxis    Patient Measurements: Height: 4' 9.99" (147.3 cm) Weight: (!) 307 lb 8.7 oz (139.5 kg) IBW/kg (Calculated) : 40.88 Heparin Dosing Weight: HEPARIN DW (KG): 80.3 HEPARIN DW (KG): 80.3 HEPARIN DW (KG): 80.3  Vital Signs: Temp: 98.1 F (36.7 C) (12/05 1121) Temp Source: Oral (12/05 1121) BP: 119/86 (12/05 1108) Pulse Rate: 75 (12/05 1121)  Labs: Recent Labs    08/28/2019 2119 08/21/19 0649 08/21/19 0958 08/21/19 1347 08/22/19 0526 08/22/19 1418 08/22/19 2216 08/23/19 0756  HGB  --  11.1*  --   --  10.9*  --   --  12.3  HCT  --  39.2  --   --  38.2  --   --  44.4  PLT  --  124*  --   --  119*  --   --  118*  APTT 33  --   --   --   --   --   --   --   HEPARINUNFRC <0.10* 0.62  --  0.70 0.88* 0.67 0.84* 0.53  CREATININE  --  1.09*  --   --  1.10*  --   --  1.15*  TROPONINIHS 8 9 9 10   --   --   --   --     Estimated Creatinine Clearance: 61 mL/min (A) (by C-G formula based on SCr of 1.15 mg/dL (H)).   Assessment: Pharmacy consulted to dose heparin for this 68 yo female with possible PE. Her D-Dimer is elevated at 2.99ug/mL-FEU.  She wasn't taking any prior anti-coagulants prior to admission.    Heparin level: 0.53, therapeutic    Goal of Therapy:  Heparin level 0.3-0.7 units/ml Monitor platelets by anticoagulation protocol: Yes   Plan:  Continue heparin infusion @ 1100 units/hr Check anti-Xa level in daily while on heparin. Continue to monitor H&H and platelets.  Thomasenia Sales, PharmD, MBA, BCGP Clinical Pharmacist  08/23/2019 11:39 AM

## 2019-08-23 NOTE — Progress Notes (Signed)
Patient report given to accepting RN. Patient made aware of transfer and agreeable. All questions addressed and answered. Patient to be transported by staff via bed.  Celestia Khat, RN

## 2019-08-23 NOTE — Progress Notes (Signed)
CRITICAL VALUE ALERT  Critical Value:  CO2 107  Date & Time Notied:  08/23/2019 1015  Provider Notified: Manuella Ghazi, MD  Orders Received/Actions taken: no new orders

## 2019-08-23 NOTE — Progress Notes (Signed)
Pt has been resting most of shift. This RN has updated her daughter several times and helped patient FaceTime chat with her daughter and grandsons. Pt did ask for another dose of PRN ativan as she states that her bipap mask is "aggravating." Will continue to assess patient.

## 2019-08-23 NOTE — Progress Notes (Signed)
ANTICOAGULATION CONSULT NOTE -   Pharmacy Consult for heparin infusion Indication: pulmonary embolus  Allergies  Allergen Reactions  . Vancomycin Cross Reactors Anaphylaxis    Patient Measurements: Height: 4' 9.99" (147.3 cm) Weight: (!) 314 lb 6 oz (142.6 kg) IBW/kg (Calculated) : 40.88 Heparin Dosing Weight: HEPARIN DW (KG): 80.3 HEPARIN DW (KG): 80.3 HEPARIN DW (KG): 80.3  Vital Signs: Temp: 97.5 F (36.4 C) (12/05 0000) Temp Source: Axillary (12/05 0000) BP: 101/59 (12/05 0000) Pulse Rate: 79 (12/05 0000)  Labs: Recent Labs    09/03/2019 1907  09/02/2019 2119 08/21/19 0649 08/21/19 0958 08/21/19 1347 08/22/19 0526 08/22/19 1418 08/22/19 2216  HGB 11.9*  --   --  11.1*  --   --  10.9*  --   --   HCT 43.5  --   --  39.2  --   --  38.2  --   --   PLT 118*  --   --  124*  --   --  119*  --   --   APTT  --   --  33  --   --   --   --   --   --   HEPARINUNFRC  --    < > <0.10* 0.62  --  0.70 0.88* 0.67 0.84*  CREATININE 0.95  --   --  1.09*  --   --  1.10*  --   --   TROPONINIHS 6  --  8 9 9 10   --   --   --    < > = values in this interval not displayed.    Estimated Creatinine Clearance: 64.8 mL/min (A) (by C-G formula based on SCr of 1.1 mg/dL (H)).   Assessment: Pharmacy consulted to dose heparin for this 68 yo female with possible PE. Her D-Dimer is elevated at 2.99ug/mL-FEU.  She wasn't taking any prior anti-coagulants prior to admission.    08/23/19 0000 UPDATE Heparin level: 0.84 IU/mL is slightly above therapeutic goal range Per RN, no s/s of bleeding or issues with infusion   Goal of Therapy:  Heparin level 0.3-0.7 units/ml Monitor platelets by anticoagulation protocol: Yes   Plan:  Reduce heparin infusion rate to  1100 units/hr Check anti-Xa level in 6-8 hours and daily while on heparin. Continue to monitor H&H and platelets.  Despina Pole, Pharm. D. Clinical Pharmacist 08/23/2019 12:13 AM

## 2019-08-23 NOTE — Progress Notes (Signed)
PROGRESS NOTE    Sabrina Curry  TMH:[id number] DOB: 20-Aug-1951 DOA: 09/12/2019 PCP: Rosita Fire, MD   Brief Narrative:  Per HPI: Elyn Krogh a 68 y.o.femalewith medical history significant ofasthma, COPD, GERD, lymphedema, anxiety, depression, obstructive sleep apnea who presented to the ER with worsening shortness of breath.Patient has had relatively sudden onset shortness of breath over the last 2 days. Usually on 2 L of home oxygen. Noted to be satting in the 70s on oxygen at home and had to be placed on 15 L nonrebreather. Denies any history of chest pain. Has had worsening lower extremity edema over the last several weeks. Also reports having abdominal wall edema. Usually ambulates with a walker at home but has had significant decline in activity. Has had orthopnea as well. Does have COPD and obstructive sleep apnea and is supposed to be on CPAP at home but is unable to use it due to claustrophobia and adamantly refuses it in the ER as well. She is also a DNR and does not wish any aggressive interventions.  12/3:Patient was admitted with acute on chronic hypoxemic and hypercapnic respiratory failure in severe respiratory distress. She refuses BiPAP or intubation and is DNR. She continues to have significant nasal cannula oxygen requirements and is nearly on 10 L. She was started on Cardizem drip as well as heparin for new onset atrial fibrillation with RVR as well as some IV Lasix for acute CHF exacerbation with preserved EF. She appears to be diuresing quite well and has almost 1.9 L of net negative fluid balance thus far. We will continue diuresis with IV Lasix. [facility] cardiology consultation with 2D echocardiogram pending. She has been started on oral Cardizem on top of her home metoprolol and we will plan to try and wean IV Cardizem as tolerated. Anticipate CT chest angiogram for PE evaluation when stable enough to do so. PT/OT evaluation pending and will likely  require PT on discharge. Follow a.m. labs.  12/4: Patient has been evaluated by PT with recommendations for SNF on discharge.  She has been weaned off of her IV Cardizem with improved heart rate control on oral Cardizem as well as metoprolol.  Cardizem has been consolidated to 120 mg daily by cardiology.  Patient still remains fairly hypoxemic and short of breath and on high flow nasal cannula oxygen.  Total of approximately 4 L net negative fluid balance noted thus far.  2D echocardiogram with LVEF 55-60% and no RV strain noted.  Patient request to have CT angiogram study tomorrow if possible.  She remains on heparin drip.  Palliative care consulted today for goals of care discussion.  12/5: Patient has had CT angiogram today with findings of atypical/viral pneumonia.  Covid testing has been negative on admission.  She continues to have significant hypoxemia and will plan to increase IV Solu-Medrol today and check respiratory viral panel as well as influenza testing.  Procalcitonin low with no leukocytosis noted.  Will avoid IV antibiotics.  Assessment & Plan:   Active Problems:   Morbid obesity ([place])   Hypertension   Obstructive sleep apnea   Thoracic aortic aneurysm ([facility])   COPD exacerbation ([facility])   Acute respiratory failure with hypoxia ([facility])   Acute on chronic respiratory failure with hypoxia (HCC)   Palliative care by specialist   Goals of care, counseling/discussion   Acute respiratory failure with hypoxia and hypercapnia ([place])   Atrial fibrillation (HCC)   Elevated d-dimer   Anxiety state   Nausea   Acute on chronic  combined respiratory failure-persistent -Patient with elevated D-dimer and noted to be obese and sedentary -CT angiogram today with no findings of PE, but rather atypical pneumonia -Covid testing negative and will check respiratory viral panel as well as influenza testing -Heparin drip to be discontinued and switched to Eliquis -Patient may progress to comfort  measures only with possible discharge to hospice home in the next 1-2 days if she continues to have worsening symptoms  Acute on chronic diastolic CHF exacerbation -2D echocardiogram with LVEF 55-60% and indeterminate diastolic function -Continue Lasix 40 mg IV twice daily as patient has been -4.7 L since admission -Monitor a.m. labs, creatinine is mildly increased -Recheck BMP in a.m. and discontinue IV Lasix if further worsening creatinine level noted  New onset atrial fibrillation with RVR-currently controlled -Patient is currently on Lopressor 75 mg twice daily as well as diltiazem 30 mg every 6 hours which will be consolidated to 120 mg daily per cardiology -CHA2DS2-VASc score is 6 and patient will remain on anticoagulation now with Eliquis and will discontinue IV heparin drip  COPD with chronic hypoxemia -Continue on Solu-Medrol as ordered, but increase to 80 mg twice daily -Breathing treatments for shortness of breath and wheezing  Obstructive sleep apnea -Refuses CPAP or BiPAP  Chronic macrocytic anemia -Appears stable on repeat CBC -Noted to have iron deficiency anemia and received Feraheme on 12/3  Mild thrombocytopenia-stable -Continue to monitor on Eliquis  Anxiety -Continue Ativan as needed  GERD -Continue IV PPI daily while on Solu-Medrol  Chronic physical deconditioning/sedentary -PT has recommended SNF on discharge, but will likely go to hospice home  DVT prophylaxis: Heparin drip changed to Eliquis Code Status: DNR Family Communication: None at bedside Disposition Plan: Plan to transfer to telemetry if stable.    Discontinue heparin drip and start Eliquis.  Appreciate palliative care evaluation with plans for likely hospice home on discharge.   Consultants:   Cardiology  Palliative care  Procedures:   None  Antimicrobials:  Anti-infectives (From admission, onward)   Start     Dose/Rate Route Frequency Ordered Stop   09/17/2019 2200   doxycycline (VIBRA-TABS) tablet 100 mg     100 mg Oral  Once 09/08/2019 2153 09/17/2019 2333       Subjective: Patient seen and evaluated today with no new acute complaints or concerns. No acute concerns or events noted overnight.  She continues to remain on high flow nasal cannula oxygen and has some anxiety surrounding use of BiPAP and even getting CT study.  Objective: Vitals:   08/23/19 1030 08/23/19 1108 08/23/19 1112 08/23/19 1121  BP: 101/70 119/86    Pulse: 78 78 83 75  Resp: (!) 22 18 (!) 22 20  Temp:    98.1 F (36.7 C)  TempSrc:    Oral  SpO2: (!) 89% (!) 87% 95% 91%  Weight:      Height:        Intake/Output Summary (Last 24 hours) at 08/23/2019 1154 Last data filed at 08/23/2019 0600 Gross per 24 hour  Intake 258.48 ml  Output 900 ml  Net -641.52 ml   Filed Weights   08/21/19 0500 08/22/19 0500 08/23/19 0214  Weight: (!) 144.7 kg (!) 142.6 kg (!) 139.5 kg    Examination:  General exam: Appears calm and comfortable, obese Respiratory system: Clear to auscultation. Respiratory effort normal.  On high flow nasal cannula oxygen. Cardiovascular system: S1 & S2 heard, RRR. No JVD, murmurs, rubs, gallops or clicks. No pedal edema. Gastrointestinal system: Abdomen  is nondistended, soft and nontender. No organomegaly or masses felt. Normal bowel sounds heard. Central nervous system: Alert and oriented. No focal neurological deficits. Extremities: Symmetric 5 x 5 power. Skin: No rashes, lesions or ulcers Psychiatry: Appears anxious    Data Reviewed: I have personally reviewed following labs and imaging studies  CBC: Recent Labs  Lab 09/01/2019 1907 08/21/19 0649 08/22/19 0526 08/23/19 0756  WBC 6.5 8.8 7.5 7.1  NEUTROABS 4.8  --   --   --   HGB 11.9* 11.1* 10.9* 12.3  HCT 43.5 39.2 38.2 44.4  MCV 114.5* 111.4* 110.4* 113.3*  PLT 118* 124* 119* 510*   Basic Metabolic Panel: Recent Labs  Lab 09/01/2019 1907 08/21/19 0649 08/22/19 0526 08/23/19 0756  NA  144 144 143 141  K 5.2* 4.4 4.3 4.6  CL 99 94* 89* 85*  CO2 35* 40* 41* 49*  GLUCOSE 115* 139* 123* 121*  BUN 20 21 25* 28*  CREATININE 0.95 1.09* 1.10* 1.15*  CALCIUM 9.2 8.9 9.1 9.1  MG  --   --  1.9 1.9   GFR: Estimated Creatinine Clearance: 61 mL/min (A) (by C-G formula based on SCr of 1.15 mg/dL (H)). Liver Function Tests: No results for input(s): AST, ALT, ALKPHOS, BILITOT, PROT, ALBUMIN in the last 168 hours. No results for input(s): LIPASE, AMYLASE in the last 168 hours. No results for input(s): AMMONIA in the last 168 hours. Coagulation Profile: No results for input(s): INR, PROTIME in the last 168 hours. Cardiac Enzymes: No results for input(s): CKTOTAL, CKMB, CKMBINDEX, TROPONINI in the last 168 hours. BNP (last 3 results) No results for input(s): PROBNP in the last 8760 hours. HbA1C: No results for input(s): HGBA1C in the last 72 hours. CBG: No results for input(s): GLUCAP in the last 168 hours. Lipid Profile: No results for input(s): CHOL, HDL, LDLCALC, TRIG, CHOLHDL, LDLDIRECT in the last 72 hours. Thyroid Function Tests: Recent Labs    09/04/2019 1907  TSH 1.299   Anemia Panel: Recent Labs    08/21/19 0649  VITAMINB12 217  FOLATE 23.2  FERRITIN 31  TIBC 405  IRON 27*  RETICCTPCT 2.9   Sepsis Labs: Recent Labs  Lab 08/22/2019 1907 09/13/2019 2119 08/23/19 0756  PROCALCITON  --   --  <0.10  LATICACIDVEN 1.3 0.9  --     Recent Results (from the past 240 hour(s))  Blood culture (routine x 2)     Status: None (Preliminary result)   Collection Time: 09/12/2019  5:37 PM   Specimen: BLOOD RIGHT HAND  Result Value Ref Range Status   Specimen Description BLOOD RIGHT HAND  Final   Special Requests   Final    BOTTLES DRAWN AEROBIC ONLY Blood Culture adequate volume   Culture   Final    NO GROWTH 3 DAYS Performed at [facility], [address] [place], [place] [address]    Report Status PENDING  Incomplete  Blood culture (routine x 2)     Status: None  (Preliminary result)   Collection Time: 08/23/2019  5:42 PM   Specimen: BLOOD RIGHT HAND  Result Value Ref Range Status   Specimen Description BLOOD RIGHT HAND  Final   Special Requests   Final    BOTTLES DRAWN AEROBIC ONLY Blood Culture adequate volume   Culture   Final    NO GROWTH 3 DAYS Performed at [facility], [address], [place], [place] [address]    Report Status PENDING  Incomplete  SARS Coronavirus 2 by RT PCR (  hospital order, performed in [facility] hospital lab) Nasopharyngeal Nasopharyngeal Swab     Status: None   Collection Time: 09/14/2019  5:58 PM   Specimen: Nasopharyngeal Swab  Result Value Ref Range Status   SARS Coronavirus 2 NEGATIVE NEGATIVE Final    Comment: (NOTE) SARS-CoV-2 target nucleic acids are NOT DETECTED. The SARS-CoV-2 RNA is generally detectable in upper and lower respiratory specimens during the acute phase of infection. The lowest concentration of SARS-CoV-2 viral copies this assay can detect is 250 copies / mL. A negative result does not preclude SARS-CoV-2 infection and should not be used as the sole basis for treatment or other patient management decisions.  A negative result may occur with improper specimen collection / handling, submission of specimen other than nasopharyngeal swab, presence of viral mutation(s) within the areas targeted by this assay, and inadequate number of viral copies (<250 copies / mL). A negative result must be combined with clinical observations, patient history, and epidemiological information. Fact Sheet for Patients:   StrictlyIdeas.no Fact Sheet for Healthcare Providers: BankingDealers.co.za This test is not yet approved or cleared  by the [place] FDA and has been authorized for detection and/or diagnosis of SARS-CoV-2 by FDA under an Emergency Use Authorization (EUA).  This EUA will remain in effect (meaning this test can be used) for the duration of  the COVID-19 declaration under Section 564(b)(1) of the Act, 21 U.S.C. section 360bbb-3(b)(1), unless the authorization is terminated or revoked sooner. Performed at [facility], [address], [place], [place] [address]   MRSA PCR Screening     Status: Abnormal   Collection Time: 08/21/19  1:38 AM   Specimen: Nasal Mucosa; Nasopharyngeal  Result Value Ref Range Status   MRSA by PCR POSITIVE (A) NEGATIVE Final    Comment:        The GeneXpert MRSA Assay (FDA approved for NASAL specimens only), is one component of a comprehensive MRSA colonization surveillance program. It is not intended to diagnose MRSA infection nor to guide or monitor treatment for MRSA infections. RESULT CALLED TO, READ BACK BY AND VERIFIED WITH: LASHLEY S. AT 0816AM ON [id number] BY THOMPSON S. Performed at [facility], [address], [place], [place] [address]          Radiology Studies: Ct Angio Chest Pe W Or Wo Contrast  Result Date: 08/23/2019 CLINICAL DATA:  Elevated D-dimer. Shortness of breath. History of thoracic aortic aneurysm. Evaluate for pulmonary embolism. EXAM: CT ANGIOGRAPHY CHEST WITH CONTRAST TECHNIQUE: Multidetector CT imaging of the chest was performed using the standard protocol during bolus administration of intravenous contrast. Multiplanar CT image reconstructions and MIPs were obtained to evaluate the vascular anatomy. CONTRAST:  13m OMNIPAQUE IOHEXOL 350 MG/ML SOLN COMPARISON:  Chest radiograph 08/22/2019; chest CT-11/20/2016; 03/17/2015 FINDINGS: Examination is degraded due to patient body habitus and associated artifact. Vascular Findings: There is adequate opacification of the pulmonary arterial system with the main pulmonary artery measuring 275 Hounsfield units. There are no discrete filling defects within the pulmonary arterial tree to suggest pulmonary embolism. Marked enlargement of the caliber of the main pulmonary artery measuring 44 mm in diameter (image 32, series 4).  Cardiomegaly. No pericardial effusion. Stable fusiform aneurysmal dilatation of the ascending thoracic aorta measuring 44 mm in diameter, similar to the 02/2015 examination. The thoracic aorta tapers to a normal caliber at the level of the aortic arch. No evidence of thoracic aortic dissection or periaortic stranding on this nongated examination. Conventional configuration of the aortic arch. Minimal amount  of atherosclerotic plaque within a normal caliber descending thoracic aorta. Review of the MIP images confirms the above findings. ---------------------------------------------------------------------------------- Nonvascular Findings: Mediastinum/Lymph Nodes: No bulky mediastinal, hilar or axillary lymph adenopathy. Lungs/Pleura: Consolidative opacities and associated volume loss and partial atelectasis of the right lower and middle lobes with associated air bronchograms. Bilateral, though right upper lobe predominant, nodular airspace opacities also with scattered associated air bronchograms (image 28, series 6). Small right-sided pleural effusion with fluid tracking within the right major fissure. Upper abdomen: Limited early arterial phase evaluation upper abdomen suggests hepatomegaly with small amount of perihepatic fluid (image 61, series 7). Additionally, there is a minimal amount of ill-defined mesenteric stranding within the root of the abdominal mesentery potentially secondary to third spacing. Musculoskeletal: No acute or aggressive osseous abnormalities. Stigmata of DISH within the thoracic spine. IMPRESSION: 1. Bilateral nodular airspace opacities with consolidative opacities involving the right middle and lower lobes with associated air bronchograms worrisome for multifocal infection, including atypical etiologies (such as COVID-19 infection). Clinical correlation is advised. 2. No evidence of pulmonary embolism. 3. Similar findings of cardiomegaly marked enlargement caliber the main artery,  nonspecific though could be seen in the setting pulmonary arterial hypertension. 4. Stable uncomplicated fusiform aneurysmal dilatation ascending thoracic aorta measuring 44 mm in diameter. Recommend annual imaging followup by CTA or MRA. This recommendation follows 2010 ACCF/AHA/AATS/ACR/ASA/SCA/SCAI/SIR/STS/SVM Guidelines for the Diagnosis and Management of Patients with Thoracic Aortic Disease. Circulation. 2010; 121: K553-Z482. Aortic aneurysm NOS (ICD10-I71.9) 5.  Aortic Atherosclerosis (ICD10-I70.0). 6. Hepatomegaly with trace amount perihepatic ascites. Correlation with LFTs is advised. Electronically Signed   By: Sandi Mariscal M.D.   On: 08/23/2019 11:40   Dg Chest Port 1 View  Result Date: 08/22/2019 CLINICAL DATA:  Hypoxemia EXAM: PORTABLE CHEST 1 VIEW COMPARISON:  09/09/2019 chest radiograph. FINDINGS: Stable cardiomediastinal silhouette with top-normal heart size. No pneumothorax. No pleural effusion. Extensive patchy nodular opacities in the mid to lower lungs bilaterally, similar to slightly worsened. IMPRESSION: Extensive patchy opacities in the mid to lower lungs bilaterally, similar to slightly worsened, potentially multifocal pneumonia, although underlying pulmonary nodules cannot be excluded. Consider chest CT with IV contrast for further evaluation versus continued chest radiograph follow-up. Electronically Signed   By: Ilona Sorrel M.D.   On: 08/22/2019 17:23        Scheduled Meds:  apixaban  5 mg Oral BID   aspirin EC  162 mg Oral Daily   Chlorhexidine Gluconate Cloth  6 each Topical Daily   diltiazem  120 mg Oral Daily   fluticasone furoate-vilanterol  1 puff Inhalation Daily   furosemide  40 mg Intravenous Q12H   isosorbide mononitrate  30 mg Oral Daily   mouth rinse  15 mL Mouth Rinse BID   methylPREDNISolone (SOLU-MEDROL) injection  40 mg Intravenous Q12H   metoprolol tartrate  75 mg Oral BID   mupirocin ointment  1 application Nasal BID   nystatin   Topical  BID   pantoprazole (PROTONIX) IV  40 mg Intravenous Q24H   sodium chloride flush  3 mL Intravenous Q12H   vitamin B-12  1,000 mcg Oral Daily   Continuous Infusions:  sodium chloride       LOS: 2 days    Time spent: 30 minutes    Vicktoria Muckey Darleen Crocker, DO [facility] Pager [phone]  If 7PM-7AM, please contact night-coverage [email or web address] Password [email or web address] 08/23/2019, 11:54 AM

## 2019-08-24 LAB — BASIC METABOLIC PANEL
Anion gap: 15 (ref 5–15)
BUN: 36 mg/dL — ABNORMAL HIGH (ref 8–23)
CO2: 46 mmol/L — ABNORMAL HIGH (ref 22–32)
Calcium: 9.2 mg/dL (ref 8.9–10.3)
Chloride: 80 mmol/L — ABNORMAL LOW (ref 98–111)
Creatinine, Ser: 1.2 mg/dL — ABNORMAL HIGH (ref 0.44–1.00)
GFR calc Af Amer: 55 mL/min — ABNORMAL LOW (ref >60)
GFR calc non Af Amer: 47 mL/min — ABNORMAL LOW (ref >60)
Glucose, Bld: 123 mg/dL — ABNORMAL HIGH (ref 70–99)
Potassium: 4 mmol/L (ref 3.5–5.1)
Sodium: 141 mmol/L (ref 135–145)

## 2019-08-24 LAB — CBC
HCT: 39.1 % (ref 36.0–46.0)
Hemoglobin: 11.1 g/dL — ABNORMAL LOW (ref 12.0–15.0)
MCH: 31.3 pg (ref 26.0–34.0)
MCHC: 28.4 g/dL — ABNORMAL LOW (ref 30.0–36.0)
MCV: 110.1 fL — ABNORMAL HIGH (ref 80.0–100.0)
Platelets: 117 10*3/uL — ABNORMAL LOW (ref 150–400)
RBC: 3.55 MIL/uL — ABNORMAL LOW (ref 3.87–5.11)
RDW: 14.9 % (ref 11.5–15.5)
WBC: 5.3 10*3/uL (ref 4.0–10.5)
nRBC: 0 % (ref 0.0–0.2)

## 2019-08-24 MED ORDER — METHYLPREDNISOLONE SODIUM SUCC 40 MG IJ SOLR
40.0000 mg | Freq: Two times a day (BID) | INTRAMUSCULAR | Status: DC
  Administered 2019-08-24 – 2019-08-25 (×2): 40 mg via INTRAVENOUS
  Filled 2019-08-24 (×2): qty 1

## 2019-08-24 MED ORDER — ALUM & MAG HYDROXIDE-SIMETH 200-200-20 MG/5ML PO SUSP
30.0000 mL | ORAL | Status: DC | PRN
  Administered 2019-08-24: 30 mL via ORAL
  Filled 2019-08-24: qty 30

## 2019-08-24 NOTE — Progress Notes (Signed)
PROGRESS NOTE    Sabrina Curry  TMA:[id number] DOB: 1951/02/25 DOA: 08/31/2019 PCP: Rosita Fire, MD   Brief Narrative:  Per HPI: Sabrina Curry a 68 y.o.femalewith medical history significant ofasthma, COPD, GERD, lymphedema, anxiety, depression, obstructive sleep apnea who presented to the ER with worsening shortness of breath.Patient has had relatively sudden onset shortness of breath over the last 2 days. Usually on 2 L of home oxygen. Noted to be satting in the 70s on oxygen at home and had to be placed on 15 L nonrebreather. Denies any history of chest pain. Has had worsening lower extremity edema over the last several weeks. Also reports having abdominal wall edema. Usually ambulates with a walker at home but has had significant decline in activity. Has had orthopnea as well. Does have COPD and obstructive sleep apnea and is supposed to be on CPAP at home but is unable to use it due to claustrophobia and adamantly refuses it in the ER as well. She is also a DNR and does not wish any aggressive interventions.  12/3:Patient was admitted with acute on chronic hypoxemic and hypercapnic respiratory failure in severe respiratory distress. She refuses BiPAP or intubation and is DNR. She continues to have significant nasal cannula oxygen requirements and is nearly on 10 L. She was started on Cardizem drip as well as heparin for new onset atrial fibrillation with RVR as well as some IV Lasix for acute CHF exacerbation with preserved EF. She appears to be diuresing quite well and has almost 1.9 L of net negative fluid balance thus far. We will continue diuresis with IV Lasix. [facility] cardiology consultation with 2D echocardiogram pending. She has been started on oral Cardizem on top of her home metoprolol and we will plan to try and wean IV Cardizem as tolerated. Anticipate CT chest angiogram for PE evaluation when stable enough to do so. PT/OT evaluation pending and will likely  require PT on discharge. Follow a.m. labs.  12/4:Patient has been evaluated by PT with recommendations for SNF on discharge. She has been weaned off of her IV Cardizem with improved heart rate control on oral Cardizem as well as metoprolol. Cardizem has been consolidated to 120 mg daily by cardiology. Patient still remains fairly hypoxemic and short of breath and on high flow nasal cannula oxygen. Total of approximately 4 L net negative fluid balance noted thus far. 2D echocardiogram with LVEF 55-60% and no RV strain noted. Patient request to have CT angiogram study tomorrow if possible. She remains on heparin drip. Palliative care consulted today for goals of care discussion.  12/5: Patient has had CT angiogram today with findings of atypical/viral pneumonia.  Covid testing has been negative on admission.  She continues to have significant hypoxemia and will plan to increase IV Solu-Medrol today and check respiratory viral panel as well as influenza testing.  Procalcitonin low with no leukocytosis noted.  Will avoid IV antibiotics.  12/6: Patient continues to have high oxygen requirements and continues to have hypercapnia and now, what appears to be some encephalopathy related to this.  Family members are at bedside and understand that she would likely transition to comfort measures soon.  Palliative care returns tomorrow and will anticipate discharge to either hospice facility versus hospice home at that time.  Assessment & Plan:   Active Problems:   Morbid obesity ([place])   Hypertension   Obstructive sleep apnea   Thoracic aortic aneurysm ([facility])   COPD exacerbation ([place])   Acute respiratory failure with hypoxia ([place])  Acute on chronic respiratory failure with hypoxia ([facility])   Palliative care by specialist   Goals of care, counseling/discussion   Acute respiratory failure with hypoxia and hypercapnia ([facility])   Atrial fibrillation ([facility])   Elevated d-dimer   Anxiety state    Nausea   Acute on chronic combined respiratory failure-persistent -Patient with elevated D-dimer and noted to be obese and sedentary -CT angiogram today with no findings of PE, but rather atypical pneumonia -Covid testing negative and will check respiratory viral panel as well as influenza testing -Heparin drip to be discontinued and switched to Eliquis -Patient may progress to comfort measures only with possible discharge to hospice home in the next 1-2 days if she continues to have worsening symptoms  Acute on chronic diastolic CHF exacerbation -2D echocardiogram with LVEF 55-60% and indeterminate diastolic function -Patient has been -6.5 L since admission, but now with increasing creatinine levels will discontinue further IV Lasix -No need for repeat BMP in a.m.  New onset atrial fibrillation with RVR-currently controlled -Patient is currently on Lopressor 75 mg twice daily as well as diltiazem 30 mg every 6 hours which will be consolidated to 120 mg daily per cardiology -CHA2DS2-VASc score is 6 and patient will remain on anticoagulation now with Eliquis and will discontinue IV heparin drip  COPD with chronic hypoxemia -Continue on Solu-Medrol as ordered -Breathing treatments for shortness of breath and wheezing  Obstructive sleep apnea -Refuses CPAP or BiPAP  Chronic macrocytic anemia -Appears stable on repeat CBC -Noted to have iron deficiency anemia and received Feraheme on 12/3  Mild thrombocytopenia-stable -Continue to monitor on Eliquis  Anxiety -Continue Ativan as needed  GERD -Continue IV PPI daily while on Solu-Medrol  Chronic physical deconditioning/sedentary -PT has recommended SNF on discharge, but will likely go to hospice home  DVT prophylaxis:Heparin drip changed to Eliquis Code Status:DNR Family Communication:Daughter at bedside Disposition Plan:Plan to discharge to hospice facility versus home with hospice by a.m. after further palliative  care input.   Consultants:  Cardiology  Palliative care  Procedures:  None  Antimicrobials:  Anti-infectives (From admission, onward)   Start     Dose/Rate Route Frequency Ordered Stop   09/15/2019 2200  doxycycline (VIBRA-TABS) tablet 100 mg     100 mg Oral  Once 09/03/2019 2153 08/30/2019 2333       Subjective: Patient seen and evaluated today with ongoing shortness of breath as well as some confusion that is now noted.  She continues to remain hypoxemic despite high flow nasal cannula and continues to insist on refusing noninvasive ventilation.  Objective: Vitals:   08/23/19 1942 08/23/19 2040 08/24/19 0415 08/24/19 0743  BP:  128/84 113/70   Pulse: 74 85 88   Resp: 18 19    Temp:  98.4 F (36.9 C) 98.7 F (37.1 C)   TempSrc:  Axillary Axillary   SpO2: (!) 80% (!) 83% (!) 83% (!) 80%  Weight:   (!) 138.4 kg   Height:        Intake/Output Summary (Last 24 hours) at 08/24/2019 1223 Last data filed at 08/24/2019 0421 Gross per 24 hour  Intake 50 ml  Output 1800 ml  Net -1750 ml   Filed Weights   08/22/19 0500 08/23/19 0214 08/24/19 0415  Weight: (!) 142.6 kg (!) 139.5 kg (!) 138.4 kg    Examination:  General exam: Appears calm and comfortable, obese Respiratory system: Clear to auscultation. Respiratory effort normal.  Currently on high flow nasal cannula. Cardiovascular system: S1 & S2  heard, RRR. No JVD, murmurs, rubs, gallops or clicks. No pedal edema. Gastrointestinal system: Abdomen is nondistended, soft and nontender. No organomegaly or masses felt. Normal bowel sounds heard. Central nervous system: Alert, but confused Extremities: Chronic lower extremity edema. Skin: No rashes, lesions or ulcers Psychiatry: Mildly anxious    Data Reviewed: I have personally reviewed following labs and imaging studies  CBC: Recent Labs  Lab 09/08/2019 1907 08/21/19 0649 08/22/19 0526 08/23/19 0756 08/24/19 0600  WBC 6.5 8.8 7.5 7.1 5.3  NEUTROABS 4.8  --    --   --   --   HGB 11.9* 11.1* 10.9* 12.3 11.1*  HCT 43.5 39.2 38.2 44.4 39.1  MCV 114.5* 111.4* 110.4* 113.3* 110.1*  PLT 118* 124* 119* 118* 481*   Basic Metabolic Panel: Recent Labs  Lab 08/23/2019 1907 08/21/19 0649 08/22/19 0526 08/23/19 0756 08/24/19 0600  NA 144 144 143 141 141  K 5.2* 4.4 4.3 4.6 4.0  CL 99 94* 89* 85* 80*  CO2 35* 40* 41* 49* 46*  GLUCOSE 115* 139* 123* 121* 123*  BUN 20 21 25* 28* 36*  CREATININE 0.95 1.09* 1.10* 1.15* 1.20*  CALCIUM 9.2 8.9 9.1 9.1 9.2  MG  --   --  1.9 1.9  --    GFR: Estimated Creatinine Clearance: 58.2 mL/min (A) (by C-G formula based on SCr of 1.2 mg/dL (H)). Liver Function Tests: No results for input(s): AST, ALT, ALKPHOS, BILITOT, PROT, ALBUMIN in the last 168 hours. No results for input(s): LIPASE, AMYLASE in the last 168 hours. No results for input(s): AMMONIA in the last 168 hours. Coagulation Profile: No results for input(s): INR, PROTIME in the last 168 hours. Cardiac Enzymes: No results for input(s): CKTOTAL, CKMB, CKMBINDEX, TROPONINI in the last 168 hours. BNP (last 3 results) No results for input(s): PROBNP in the last 8760 hours. HbA1C: No results for input(s): HGBA1C in the last 72 hours. CBG: No results for input(s): GLUCAP in the last 168 hours. Lipid Profile: No results for input(s): CHOL, HDL, LDLCALC, TRIG, CHOLHDL, LDLDIRECT in the last 72 hours. Thyroid Function Tests: No results for input(s): TSH, T4TOTAL, FREET4, T3FREE, THYROIDAB in the last 72 hours. Anemia Panel: No results for input(s): VITAMINB12, FOLATE, FERRITIN, TIBC, IRON, RETICCTPCT in the last 72 hours. Sepsis Labs: Recent Labs  Lab 08/19/2019 1907 08/19/2019 2119 08/23/19 0756  PROCALCITON  --   --  <0.10  LATICACIDVEN 1.3 0.9  --     Recent Results (from the past 240 hour(s))  Blood culture (routine x 2)     Status: None (Preliminary result)   Collection Time: 09/11/2019  5:37 PM   Specimen: BLOOD RIGHT HAND  Result Value Ref Range  Status   Specimen Description BLOOD RIGHT HAND  Final   Special Requests   Final    BOTTLES DRAWN AEROBIC ONLY Blood Culture adequate volume   Culture   Final    NO GROWTH 4 DAYS Performed at [facility], [address] [place], [place] [address]    Report Status PENDING  Incomplete  Blood culture (routine x 2)     Status: None (Preliminary result)   Collection Time: 08/19/2019  5:42 PM   Specimen: BLOOD RIGHT HAND  Result Value Ref Range Status   Specimen Description BLOOD RIGHT HAND  Final   Special Requests   Final    BOTTLES DRAWN AEROBIC ONLY Blood Culture adequate volume   Culture   Final    NO GROWTH 4 DAYS Performed at [facility]  [facility], [address], [place], [place] [address]    Report Status PENDING  Incomplete  SARS Coronavirus 2 by RT PCR (hospital order, performed in [facility] hospital lab) Nasopharyngeal Nasopharyngeal Swab     Status: None   Collection Time: 09/03/2019  5:58 PM   Specimen: Nasopharyngeal Swab  Result Value Ref Range Status   SARS Coronavirus 2 NEGATIVE NEGATIVE Final    Comment: (NOTE) SARS-CoV-2 target nucleic acids are NOT DETECTED. The SARS-CoV-2 RNA is generally detectable in upper and lower respiratory specimens during the acute phase of infection. The lowest concentration of SARS-CoV-2 viral copies this assay can detect is 250 copies / mL. A negative result does not preclude SARS-CoV-2 infection and should not be used as the sole basis for treatment or other patient management decisions.  A negative result may occur with improper specimen collection / handling, submission of specimen other than nasopharyngeal swab, presence of viral mutation(s) within the areas targeted by this assay, and inadequate number of viral copies (<250 copies / mL). A negative result must be combined with clinical observations, patient history, and epidemiological information. Fact Sheet for Patients:   StrictlyIdeas.no Fact Sheet for  Healthcare Providers: BankingDealers.co.za This test is not yet approved or cleared  by the [place] FDA and has been authorized for detection and/or diagnosis of SARS-CoV-2 by FDA under an Emergency Use Authorization (EUA).  This EUA will remain in effect (meaning this test can be used) for the duration of the COVID-19 declaration under Section 564(b)(1) of the Act, 21 U.S.C. section 360bbb-3(b)(1), unless the authorization is terminated or revoked sooner. Performed at [facility], [address], [place], [place] [address]   MRSA PCR Screening     Status: Abnormal   Collection Time: 08/21/19  1:38 AM   Specimen: Nasal Mucosa; Nasopharyngeal  Result Value Ref Range Status   MRSA by PCR POSITIVE (A) NEGATIVE Final    Comment:        The GeneXpert MRSA Assay (FDA approved for NASAL specimens only), is one component of a comprehensive MRSA colonization surveillance program. It is not intended to diagnose MRSA infection nor to guide or monitor treatment for MRSA infections. RESULT CALLED TO, READ BACK BY AND VERIFIED WITH: LASHLEY S. AT 0816AM ON [id number] BY THOMPSON S. Performed at [facility], [address], [place],  [address]          Radiology Studies: Ct Angio Chest Pe W Or Wo Contrast  Result Date: 08/23/2019 CLINICAL DATA:  Elevated D-dimer. Shortness of breath. History of thoracic aortic aneurysm. Evaluate for pulmonary embolism. EXAM: CT ANGIOGRAPHY CHEST WITH CONTRAST TECHNIQUE: Multidetector CT imaging of the chest was performed using the standard protocol during bolus administration of intravenous contrast. Multiplanar CT image reconstructions and MIPs were obtained to evaluate the vascular anatomy. CONTRAST:  166m OMNIPAQUE IOHEXOL 350 MG/ML SOLN COMPARISON:  Chest radiograph 08/22/2019; chest CT-11/20/2016; 03/17/2015 FINDINGS: Examination is degraded due to patient body habitus and associated artifact. Vascular Findings: There is  adequate opacification of the pulmonary arterial system with the main pulmonary artery measuring 275 Hounsfield units. There are no discrete filling defects within the pulmonary arterial tree to suggest pulmonary embolism. Marked enlargement of the caliber of the main pulmonary artery measuring 44 mm in diameter (image 32, series 4). Cardiomegaly. No pericardial effusion. Stable fusiform aneurysmal dilatation of the ascending thoracic aorta measuring 44 mm in diameter, similar to the 02/2015 examination. The thoracic aorta tapers to a normal caliber at the level of the  aortic arch. No evidence of thoracic aortic dissection or periaortic stranding on this nongated examination. Conventional configuration of the aortic arch. Minimal amount of atherosclerotic plaque within a normal caliber descending thoracic aorta. Review of the MIP images confirms the above findings. ---------------------------------------------------------------------------------- Nonvascular Findings: Mediastinum/Lymph Nodes: No bulky mediastinal, hilar or axillary lymph adenopathy. Lungs/Pleura: Consolidative opacities and associated volume loss and partial atelectasis of the right lower and middle lobes with associated air bronchograms. Bilateral, though right upper lobe predominant, nodular airspace opacities also with scattered associated air bronchograms (image 28, series 6). Small right-sided pleural effusion with fluid tracking within the right major fissure. Upper abdomen: Limited early arterial phase evaluation upper abdomen suggests hepatomegaly with small amount of perihepatic fluid (image 61, series 7). Additionally, there is a minimal amount of ill-defined mesenteric stranding within the root of the abdominal mesentery potentially secondary to third spacing. Musculoskeletal: No acute or aggressive osseous abnormalities. Stigmata of DISH within the thoracic spine. IMPRESSION: 1. Bilateral nodular airspace opacities with consolidative  opacities involving the right middle and lower lobes with associated air bronchograms worrisome for multifocal infection, including atypical etiologies (such as COVID-19 infection). Clinical correlation is advised. 2. No evidence of pulmonary embolism. 3. Similar findings of cardiomegaly marked enlargement caliber the main artery, nonspecific though could be seen in the setting pulmonary arterial hypertension. 4. Stable uncomplicated fusiform aneurysmal dilatation ascending thoracic aorta measuring 44 mm in diameter. Recommend annual imaging followup by CTA or MRA. This recommendation follows 2010 ACCF/AHA/AATS/ACR/ASA/SCA/SCAI/SIR/STS/SVM Guidelines for the Diagnosis and Management of Patients with Thoracic Aortic Disease. Circulation. 2010; 121: O378-H885. Aortic aneurysm NOS (ICD10-I71.9) 5.  Aortic Atherosclerosis (ICD10-I70.0). 6. Hepatomegaly with trace amount perihepatic ascites. Correlation with LFTs is advised. Electronically Signed   By: Sandi Mariscal M.D.   On: 08/23/2019 11:40   Dg Chest Port 1 View  Result Date: 08/22/2019 CLINICAL DATA:  Hypoxemia EXAM: PORTABLE CHEST 1 VIEW COMPARISON:  09/09/2019 chest radiograph. FINDINGS: Stable cardiomediastinal silhouette with top-normal heart size. No pneumothorax. No pleural effusion. Extensive patchy nodular opacities in the mid to lower lungs bilaterally, similar to slightly worsened. IMPRESSION: Extensive patchy opacities in the mid to lower lungs bilaterally, similar to slightly worsened, potentially multifocal pneumonia, although underlying pulmonary nodules cannot be excluded. Consider chest CT with IV contrast for further evaluation versus continued chest radiograph follow-up. Electronically Signed   By: Ilona Sorrel M.D.   On: 08/22/2019 17:23        Scheduled Meds:  apixaban  5 mg Oral BID   aspirin EC  162 mg Oral Daily   budesonide (PULMICORT) nebulizer solution  0.25 mg Nebulization BID   Chlorhexidine Gluconate Cloth  6 each  Topical Daily   diltiazem  120 mg Oral Daily   fluticasone furoate-vilanterol  1 puff Inhalation Daily   isosorbide mononitrate  30 mg Oral Daily   mouth rinse  15 mL Mouth Rinse BID   methylPREDNISolone (SOLU-MEDROL) injection  40 mg Intravenous Q12H   metoprolol tartrate  75 mg Oral BID   mupirocin ointment  1 application Nasal BID   nystatin   Topical BID   pantoprazole (PROTONIX) IV  40 mg Intravenous Q24H   sodium chloride flush  3 mL Intravenous Q12H   vitamin B-12  1,000 mcg Oral Daily   Continuous Infusions:  sodium chloride       LOS: 3 days    Time spent: 30 minutes    Tasnia Spegal Darleen Crocker, DO [facility] Pager [phone]  If 7PM-7AM, please contact night-coverage [email or web address]  Password [email or web address] 08/24/2019, 12:23 PM

## 2019-08-25 DIAGNOSIS — J189 Pneumonia, unspecified organism: Secondary | ICD-10-CM

## 2019-08-25 LAB — CBC
HCT: 38.8 % (ref 36.0–46.0)
Hemoglobin: 11.4 g/dL — ABNORMAL LOW (ref 12.0–15.0)
MCH: 31.3 pg (ref 26.0–34.0)
MCHC: 29.4 g/dL — ABNORMAL LOW (ref 30.0–36.0)
MCV: 106.6 fL — ABNORMAL HIGH (ref 80.0–100.0)
Platelets: 130 10*3/uL — ABNORMAL LOW (ref 150–400)
RBC: 3.64 MIL/uL — ABNORMAL LOW (ref 3.87–5.11)
RDW: 14.6 % (ref 11.5–15.5)
WBC: 6.4 10*3/uL (ref 4.0–10.5)
nRBC: 0 % (ref 0.0–0.2)

## 2019-08-25 LAB — RESPIRATORY PANEL BY PCR

## 2019-08-25 LAB — CULTURE, BLOOD (ROUTINE X 2)
Culture: NO GROWTH
Culture: NO GROWTH
Special Requests: ADEQUATE
Special Requests: ADEQUATE

## 2019-08-25 LAB — INFLUENZA PANEL BY PCR (TYPE A & B)
Influenza A By PCR: NEGATIVE
Influenza B By PCR: NEGATIVE

## 2019-08-25 MED ORDER — LORAZEPAM 0.5 MG PO TABS
0.5000 mg | ORAL_TABLET | ORAL | Status: DC | PRN
  Administered 2019-08-25: 0.5 mg via ORAL
  Filled 2019-08-25: qty 1

## 2019-08-25 MED ORDER — PROMETHAZINE HCL 25 MG/ML IJ SOLN
12.5000 mg | Freq: Four times a day (QID) | INTRAMUSCULAR | Status: DC | PRN
  Administered 2019-08-26 – 2019-08-27 (×2): 12.5 mg via INTRAVENOUS
  Filled 2019-08-25 (×2): qty 1

## 2019-08-25 MED ORDER — SALINE SPRAY 0.65 % NA SOLN
1.0000 | NASAL | Status: DC | PRN
  Filled 2019-08-25: qty 44

## 2019-08-25 MED ORDER — MORPHINE SULFATE (CONCENTRATE) 10 MG/0.5ML PO SOLN
5.0000 mg | ORAL | Status: DC | PRN
  Administered 2019-08-25: 10 mg via SUBLINGUAL
  Administered 2019-08-26: 5 mg via SUBLINGUAL
  Filled 2019-08-25 (×2): qty 0.5

## 2019-08-25 MED ORDER — MORPHINE SULFATE (PF) 2 MG/ML IV SOLN
1.0000 mg | INTRAVENOUS | Status: DC | PRN
  Administered 2019-08-25: 2 mg via INTRAVENOUS
  Filled 2019-08-25: qty 1

## 2019-08-25 MED ORDER — CHLORHEXIDINE GLUCONATE 0.12 % MT SOLN
15.0000 mL | Freq: Two times a day (BID) | OROMUCOSAL | Status: DC
  Administered 2019-08-26 – 2019-08-27 (×2): 15 mL via OROMUCOSAL
  Filled 2019-08-25: qty 15

## 2019-08-25 NOTE — Progress Notes (Signed)
PROGRESS NOTE    Sabrina Curry  FKC:[id number] DOB: 10-23-1950 DOA: 08/28/2019 PCP: Rosita Fire, MD   Brief Narrative:  Per HPI: Sabrina Curry a 68 y.o.femalewith medical history significant ofasthma, COPD, GERD, lymphedema, anxiety, depression, obstructive sleep apnea who presented to the ER with worsening shortness of breath.Patient has had relatively sudden onset shortness of breath over the last 2 days. Usually on 2 L of home oxygen. Noted to be satting in the 70s on oxygen at home and had to be placed on 15 L nonrebreather. Denies any history of chest pain. Has had worsening lower extremity edema over the last several weeks. Also reports having abdominal wall edema. Usually ambulates with a walker at home but has had significant decline in activity. Has had orthopnea as well. Does have COPD and obstructive sleep apnea and is supposed to be on CPAP at home but is unable to use it due to claustrophobia and adamantly refuses it in the ER as well. She is also a DNR and does not wish any aggressive interventions.  12/3:Patient was admitted with acute on chronic hypoxemic and hypercapnic respiratory failure in severe respiratory distress. She refuses BiPAP or intubation and is DNR. She continues to have significant nasal cannula oxygen requirements and is nearly on 10 L. She was started on Cardizem drip as well as heparin for new onset atrial fibrillation with RVR as well as some IV Lasix for acute CHF exacerbation with preserved EF. She appears to be diuresing quite well and has almost 1.9 L of net negative fluid balance thus far. We will continue diuresis with IV Lasix. [place] cardiology consultation with 2D echocardiogram pending. She has been started on oral Cardizem on top of her home metoprolol and we will plan to try and wean IV Cardizem as tolerated. Anticipate CT chest angiogram for PE evaluation when stable enough to do so. PT/OT evaluation pending and will likely  require PT on discharge. Follow a.m. labs.  12/4:Patient has been evaluated by PT with recommendations for SNF on discharge. She has been weaned off of her IV Cardizem with improved heart rate control on oral Cardizem as well as metoprolol. Cardizem has been consolidated to 120 mg daily by cardiology. Patient still remains fairly hypoxemic and short of breath and on high flow nasal cannula oxygen. Total of approximately 4 L net negative fluid balance noted thus far. 2D echocardiogram with LVEF 55-60% and no RV strain noted. Patient request to have CT angiogram study tomorrow if possible. She remains on heparin drip. Palliative care consulted today for goals of care discussion.  12/5:Patient has had CT angiogram today with findings of atypical/viral pneumonia. Covid testing has been negative on admission. She continues to have significant hypoxemia and will plan to increase IV Solu-Medrol today and check respiratory viral panel as well as influenza testing. Procalcitonin low with no leukocytosis noted. Will avoid IV antibiotics.  12/6: Patient continues to have high oxygen requirements and continues to have hypercapnia and now, what appears to be some encephalopathy related to this.  Family members are at bedside and understand that she would likely transition to comfort measures soon.  Palliative care returns tomorrow and will anticipate discharge to either hospice facility versus hospice home at that time.  12/7: Patient continues with high oxygen requirements as well as some confusion.  Seen by palliative care today and transitioned to comfort care.  Plans for discharge to hospice facility once bed has been arranged.  Assessment & Plan:   Active Problems:  Morbid obesity ([place])   Hypertension   Obstructive sleep apnea   Thoracic aortic aneurysm (HCC)   COPD exacerbation (HCC)   Acute respiratory failure with hypoxia (HCC)   Acute on chronic respiratory failure with hypoxia  (HCC)   Palliative care by specialist   Goals of care, counseling/discussion   Acute respiratory failure with hypoxia and hypercapnia (HCC)   Atrial fibrillation (HCC)   Elevated d-dimer   Anxiety state   Nausea   Acute on chronic combined respiratory failure-persistent -Patient with elevated D-dimer and noted to be obese and sedentary -CT angiogram today with no findings of PE, but rather atypical pneumonia -Covid testing negative and will check respiratory viral panel as well as influenza testing -Heparin drip to be discontinued and switched to Eliquis -Patient may progress to comfort measures only with possible discharge to hospice home in the next 1-2 days if she continues to have worsening symptoms  Acute on chronic diastolic CHF exacerbation -2D echocardiogram with LVEF 55-60% and indeterminate diastolic function -Patient has been -6.5L since admission, but now with increasing creatinine levels will discontinue further IV Lasix -No need for repeat BMP in a.m.  New onset atrial fibrillation with RVR-currently controlled -Patient is currently on Lopressor 75 mg twice daily as well as diltiazem 30 mg every 6 hours which will be consolidated to 120 mg daily per cardiology -CHA2DS2-VASc score is 6 and patientwill remain on anticoagulation now with Eliquis and will discontinue IV heparin drip  COPD with chronic hypoxemia -Continue on Solu-Medrol as ordered -Breathing treatments for shortness of breath and wheezing  Obstructive sleep apnea -Refuses CPAP or BiPAP  Chronic macrocytic anemia -Appears stable on repeat CBC -Noted to have iron deficiency anemia and received Feraheme on 12/3  Mild thrombocytopenia-stable -Continue to monitor onEliquis  Anxiety -Continue Ativan as needed  GERD -Continue IV PPI daily while on Solu-Medrol  Chronic physical deconditioning/sedentary  Patient now on comfort measures care.  Seeking hospice facility placement.   DVT  prophylaxis:Eliquis Code Status:DNR Family Communication:Daughter at bedside on 12/7 Disposition Plan:Plan to discharge to hospice facility once bed has been arranged.   Consultants:  Cardiology  Palliative care  Procedures:  None  Antimicrobials:  Anti-infectives (From admission, onward)   Start     Dose/Rate Route Frequency Ordered Stop   08/21/2019 2200  doxycycline (VIBRA-TABS) tablet 100 mg     100 mg Oral  Once 09/13/2019 2153 09/01/2019 2333       Subjective: Patient seen and evaluated today and requires high flow nasal cannula oxygen and appears confused.  Denies any specific complaints.  Objective: Vitals:   08/24/19 1945 08/24/19 2018 08/25/19 0642 08/25/19 0804  BP: 120/76  135/78   Pulse: 64  94   Resp: (!) 21 18 18    Temp: 98.3 F (36.8 C)  98 F (36.7 C)   TempSrc: Oral  Oral   SpO2: (!) 85% (!) 85% (!) 85% (!) 88%  Weight:      Height:        Intake/Output Summary (Last 24 hours) at 08/25/2019 1427 Last data filed at 08/25/2019 0200 Gross per 24 hour  Intake 729 ml  Output 1800 ml  Net -1071 ml   Filed Weights   08/22/19 0500 08/23/19 0214 08/24/19 0415  Weight: (!) 142.6 kg (!) 139.5 kg (!) 138.4 kg    Examination:  General exam: Appears calm and comfortable, obese Respiratory system: Clear to auscultation. Respiratory effort normal.  Currently on high flow nasal cannula oxygen. Cardiovascular system:  S1 & S2 heard, RRR. No JVD, murmurs, rubs, gallops or clicks.  Significant, chronic bilateral edema Gastrointestinal system: Abdomen is nondistended, soft and nontender. No organomegaly or masses felt. Normal bowel sounds heard. Central nervous system: Awake. Extremities: Significant bilateral lower extremity edema noted. Skin: No rashes, lesions or ulcers Psychiatry: Difficult to assess.    Data Reviewed: I have personally reviewed following labs and imaging studies  CBC: Recent Labs  Lab 09/05/2019 1907 08/21/19 0649  08/22/19 0526 08/23/19 0756 08/24/19 0600 08/25/19 0440  WBC 6.5 8.8 7.5 7.1 5.3 6.4  NEUTROABS 4.8  --   --   --   --   --   HGB 11.9* 11.1* 10.9* 12.3 11.1* 11.4*  HCT 43.5 39.2 38.2 44.4 39.1 38.8  MCV 114.5* 111.4* 110.4* 113.3* 110.1* 106.6*  PLT 118* 124* 119* 118* 117* 017*   Basic Metabolic Panel: Recent Labs  Lab 09/17/2019 1907 08/21/19 0649 08/22/19 0526 08/23/19 0756 08/24/19 0600  NA 144 144 143 141 141  K 5.2* 4.4 4.3 4.6 4.0  CL 99 94* 89* 85* 80*  CO2 35* 40* 41* 49* 46*  GLUCOSE 115* 139* 123* 121* 123*  BUN 20 21 25* 28* 36*  CREATININE 0.95 1.09* 1.10* 1.15* 1.20*  CALCIUM 9.2 8.9 9.1 9.1 9.2  MG  --   --  1.9 1.9  --    GFR: Estimated Creatinine Clearance: 58.2 mL/min (A) (by C-G formula based on SCr of 1.2 mg/dL (H)). Liver Function Tests: No results for input(s): AST, ALT, ALKPHOS, BILITOT, PROT, ALBUMIN in the last 168 hours. No results for input(s): LIPASE, AMYLASE in the last 168 hours. No results for input(s): AMMONIA in the last 168 hours. Coagulation Profile: No results for input(s): INR, PROTIME in the last 168 hours. Cardiac Enzymes: No results for input(s): CKTOTAL, CKMB, CKMBINDEX, TROPONINI in the last 168 hours. BNP (last 3 results) No results for input(s): PROBNP in the last 8760 hours. HbA1C: No results for input(s): HGBA1C in the last 72 hours. CBG: No results for input(s): GLUCAP in the last 168 hours. Lipid Profile: No results for input(s): CHOL, HDL, LDLCALC, TRIG, CHOLHDL, LDLDIRECT in the last 72 hours. Thyroid Function Tests: No results for input(s): TSH, T4TOTAL, FREET4, T3FREE, THYROIDAB in the last 72 hours. Anemia Panel: No results for input(s): VITAMINB12, FOLATE, FERRITIN, TIBC, IRON, RETICCTPCT in the last 72 hours. Sepsis Labs: Recent Labs  Lab 08/22/2019 1907 09/17/2019 2119 08/23/19 0756  PROCALCITON  --   --  <0.10  LATICACIDVEN 1.3 0.9  --     Recent Results (from the past 240 hour(s))  Blood culture  (routine x 2)     Status: None   Collection Time: 08/23/2019  5:37 PM   Specimen: BLOOD RIGHT HAND  Result Value Ref Range Status   Specimen Description BLOOD RIGHT HAND  Final   Special Requests   Final    BOTTLES DRAWN AEROBIC ONLY Blood Culture adequate volume   Culture   Final    NO GROWTH 5 DAYS Performed at [facility], [address] [place], [place] [address]    Report Status 08/25/2019 FINAL  Final  Blood culture (routine x 2)     Status: None   Collection Time: 09/14/2019  5:42 PM   Specimen: BLOOD RIGHT HAND  Result Value Ref Range Status   Specimen Description BLOOD RIGHT HAND  Final   Special Requests   Final    BOTTLES DRAWN AEROBIC ONLY Blood Culture adequate volume   Culture  Final    NO GROWTH 5 DAYS Performed at [facility], [address] [place], [place] [address]    Report Status 08/25/2019 FINAL  Final  SARS Coronavirus 2 by RT PCR (hospital order, performed in [facility] hospital lab) Nasopharyngeal Nasopharyngeal Swab     Status: None   Collection Time: 08/26/2019  5:58 PM   Specimen: Nasopharyngeal Swab  Result Value Ref Range Status   SARS Coronavirus 2 NEGATIVE NEGATIVE Final    Comment: (NOTE) SARS-CoV-2 target nucleic acids are NOT DETECTED. The SARS-CoV-2 RNA is generally detectable in upper and lower respiratory specimens during the acute phase of infection. The lowest concentration of SARS-CoV-2 viral copies this assay can detect is 250 copies / mL. A negative result does not preclude SARS-CoV-2 infection and should not be used as the sole basis for treatment or other patient management decisions.  A negative result may occur with improper specimen collection / handling, submission of specimen other than nasopharyngeal swab, presence of viral mutation(s) within the areas targeted by this assay, and inadequate number of viral copies (<250 copies / mL). A negative result must be combined with clinical observations, patient history, and  epidemiological information. Fact Sheet for Patients:   StrictlyIdeas.no Fact Sheet for Healthcare Providers: BankingDealers.co.za This test is not yet approved or cleared  by the [place] FDA and has been authorized for detection and/or diagnosis of SARS-CoV-2 by FDA under an Emergency Use Authorization (EUA).  This EUA will remain in effect (meaning this test can be used) for the duration of the COVID-19 declaration under Section 564(b)(1) of the Act, 21 U.S.C. section 360bbb-3(b)(1), unless the authorization is terminated or revoked sooner. Performed at [facility], [address], [place], [place] [address]   MRSA PCR Screening     Status: Abnormal   Collection Time: 08/21/19  1:38 AM   Specimen: Nasal Mucosa; Nasopharyngeal  Result Value Ref Range Status   MRSA by PCR POSITIVE (A) NEGATIVE Final    Comment:        The GeneXpert MRSA Assay (FDA approved for NASAL specimens only), is one component of a comprehensive MRSA colonization surveillance program. It is not intended to diagnose MRSA infection nor to guide or monitor treatment for MRSA infections. RESULT CALLED TO, READ BACK BY AND VERIFIED WITH: LASHLEY S. AT 0816AM ON [id number] BY THOMPSON S. Performed at [facility], [address] [place], [place] [address]   Respiratory Panel by PCR     Status: None   Collection Time: 08/23/19 11:48 AM   Specimen: Nasopharyngeal Swab; Respiratory  Result Value Ref Range Status   Adenovirus NOT DETECTED NOT DETECTED Final   Coronavirus 229E NOT DETECTED NOT DETECTED Final    Comment: (NOTE) The Coronavirus on the Respiratory Panel, DOES NOT test for the novel  Coronavirus (2019 nCoV)    Coronavirus HKU1 NOT DETECTED NOT DETECTED Final   Coronavirus NL63 NOT DETECTED NOT DETECTED Final   Coronavirus OC43 NOT DETECTED NOT DETECTED Final   Metapneumovirus NOT DETECTED NOT DETECTED Final   Rhinovirus / Enterovirus NOT DETECTED  NOT DETECTED Final   Influenza A NOT DETECTED NOT DETECTED Final   Influenza B NOT DETECTED NOT DETECTED Final   Parainfluenza Virus 1 NOT DETECTED NOT DETECTED Final   Parainfluenza Virus 2 NOT DETECTED NOT DETECTED Final   Parainfluenza Virus 3 NOT DETECTED NOT DETECTED Final   Parainfluenza Virus 4 NOT DETECTED NOT DETECTED Final   Respiratory Syncytial Virus NOT DETECTED NOT DETECTED Final  Bordetella pertussis NOT DETECTED NOT DETECTED Final   Chlamydophila pneumoniae NOT DETECTED NOT DETECTED Final   Mycoplasma pneumoniae NOT DETECTED NOT DETECTED Final    Comment: Performed at [facility], [place] [address] [place], [place] [address]         Radiology Studies: No results found.      Scheduled Meds:  [START ON 08/26/2019] chlorhexidine  15 mL Mouth Rinse BID   diltiazem  120 mg Oral Daily   fluticasone furoate-vilanterol  1 puff Inhalation Daily   isosorbide mononitrate  30 mg Oral Daily   mouth rinse  15 mL Mouth Rinse BID   metoprolol tartrate  75 mg Oral BID   mupirocin ointment  1 application Nasal BID   nystatin   Topical BID   sodium chloride flush  3 mL Intravenous Q12H   Continuous Infusions:  sodium chloride       LOS: 4 days    Time spent: 30 minutes    Rilda Bulls Darleen Crocker, DO [facility] Pager [phone]  If 7PM-7AM, please contact night-coverage [email or web address] Password [email or web address] 08/25/2019, 2:27 PM

## 2019-08-25 NOTE — TOC Progression Note (Signed)
Transition of Care [facility]) - Progression Note    Patient Details  Name: Sabrina Curry MRN: [id number] Date of Birth: 1951/06/05  Transition of Care [facility]) CM/SW Contact  Paulina Muchmore, Chauncey Reading, RN Phone Number: 08/25/2019, 12:37 PM  Clinical Narrative:   Referred to [facility] for residential hospice. Patient currently on 15 liters of oxygen. Will work on weaning O2 down for transfer to facility.    Expected Discharge Plan: [place] Barriers to Discharge: Continued Medical Work up, SNF Pending bed offer  Expected Discharge Plan and Services Expected Discharge Plan: [place]   Discharge Planning Services: CM Consult   Living arrangements for the past 2 months: Single Family Home                                       Social Determinants of Health (SDOH) Interventions    Readmission Risk Interventions No flowsheet data found.

## 2019-08-25 NOTE — Progress Notes (Signed)
OT Cancellation Note  Patient Details Name: Sabrina Curry MRN: [id number] DOB: 07-01-1951   Cancelled Treatment:    Reason Eval/Treat Not Completed: OT screened, no needs identified, will sign off. Due to patient's worsening condition, MD note states that patient will likely be transitioning to comfort measures soon. Palliative consult has been placed. Patient will likely be discharge to hospice home versus SNF as PT has recommended. No further OT needs are seen at this time. Please re-order OT if any needs arise.Thank you for the referral.    Ailene Ravel, OTR/L,CBIS  [phone]  08/25/2019, 8:21 AM

## 2019-08-25 NOTE — Care Management Important Message (Signed)
Important Message  Patient Details  Name: Sabrina Curry MRN: [id number] Date of Birth: 12-02-1950   Medicare Important Message Given:  Yes     Tommy Medal 08/25/2019, 1:17 PM

## 2019-08-25 NOTE — Progress Notes (Signed)
Daily Progress Note   Patient Name: Sabrina Curry       Date: 08/25/2019 DOB: 1951/03/22  Age: 68 y.o. MRN#: [id number] Attending Physician: Rodena Goldmann, DO Primary Care Physician: Rosita Fire, MD Admit Date: 09/04/2019  Reason for Consultation/Follow-up: Establishing goals of care  Subjective: Patient awake, alert, and able to participate in discussion but intermittently confused. Mildly shortness of breath at rest, remains on 15L [place]. Poor appetite.   GOC:  Time spent with patient and two daughter (Sabrina Curry and Sabrina Curry) at bedside. Discussed events leading up to admission and course of hospitalization including diagnoses, interventions, plan of care. Discussed chronic, irreversible nature of her lung disease (COPD/OSA/likely OHS) and that with ongoing refusal of BiPAP mask, will indicate worsening clinical condition and prognosis. Discussed high risk for recurrent CO2 rentention.  Discussed discharge options including SNF rehab, which patient will be isolated from family and also unable to perform physical therapy requirements SNF has to offer. Also discussed hospice options, including residential and home hospice. Frankly and compassionately explained prognosis of likely weeks with chronic respiratory failure, recurrent hypercarbia, and again refusal of BiPAP and requiring 15L oxygen. Discussed comfort focused pathway and emphasis on symptom management. Patient and family prefer hospice facility placement, feeling they will better care for her and manage symptoms at EOL.   Patient understands poor prognosis and is "ready." She continues to refuse BiPAP understanding this will shorten her life. She initially was fearful at the thought of prn morphine, stating this killed her husband. Family  explains that her husband did not die from morphine, he died from a heart attack and morphine helped him from suffering at the end. Patient is agreeable to try prn morphine for dyspnea/air hunger. Ativan helping with anxiety.    **Daughters request to further discuss plan of care in family waiting area. They share that yesterday, [place] was confused, drowsy, and talking to deceased relatives. They feel she is nearing EOL and may transition quickly when she sees her grandchildren. She is only accepting bites of food. She has been severely depressed since the death of her husband last summer.   Further discussed comfort medications, discontinuation of medications not aimed at comfort, and hospice facility transfer once bed available.   Therapeutic listening and emotional/spiritual support provided. PMT contact information given. Discussed plan to try prn  roxanol and attempt to wean oxygen, since she likely will not be able to transfer to hospice facility on 15L.   Length of Stay: 4  Current Medications: Scheduled Meds:  . apixaban  5 mg Oral BID  . aspirin EC  162 mg Oral Daily  . budesonide (PULMICORT) nebulizer solution  0.25 mg Nebulization BID  . [START ON 08/26/2019] chlorhexidine  15 mL Mouth Rinse BID  . Chlorhexidine Gluconate Cloth  6 each Topical Daily  . diltiazem  120 mg Oral Daily  . fluticasone furoate-vilanterol  1 puff Inhalation Daily  . isosorbide mononitrate  30 mg Oral Daily  . mouth rinse  15 mL Mouth Rinse BID  . methylPREDNISolone (SOLU-MEDROL) injection  40 mg Intravenous Q12H  . metoprolol tartrate  75 mg Oral BID  . mupirocin ointment  1 application Nasal BID  . nystatin   Topical BID  . pantoprazole (PROTONIX) IV  40 mg Intravenous Q24H  . sodium chloride flush  3 mL Intravenous Q12H  . vitamin B-12  1,000 mcg Oral Daily    Continuous Infusions: . sodium chloride      PRN Meds: sodium chloride, acetaminophen **OR** acetaminophen, alum & mag  hydroxide-simeth, bisacodyl, ibuprofen, levalbuterol, LORazepam, ondansetron **OR** ondansetron (ZOFRAN) IV, polyethylene glycol, sodium chloride flush, traMADol  Physical Exam Vitals signs and nursing note reviewed.  Constitutional:      Appearance: She is ill-appearing.  Pulmonary:     Effort: No tachypnea, accessory muscle usage or respiratory distress.     Comments: 15L Skin:    General: Skin is warm and dry.  Neurological:     Mental Status: She is alert, oriented to person, place, and time and easily aroused.     Comments: Intermittent confusion/lethargy            Vital Signs: BP 135/78   Pulse 94   Temp 98 F (36.7 C) (Oral)   Resp 18   Ht 4' 9.99" (1.473 m)   Wt (!) 138.4 kg   SpO2 (!) 88%   BMI 63.79 kg/m  SpO2: SpO2: (!) 88 % O2 Device: O2 Device: High Flow Nasal Cannula O2 Flow Rate: O2 Flow Rate (L/min): 15 L/min  Intake/output summary:   Intake/Output Summary (Last 24 hours) at 08/25/2019 8315 Last data filed at 08/25/2019 0200 Gross per 24 hour  Intake 969 ml  Output 1800 ml  Net -831 ml   LBM: Last BM Date: 08/24/19 Baseline Weight: Weight: (!) 148.4 kg Most recent weight: Weight: (!) 138.4 kg       Palliative Assessment/Data: PPS 20%      Patient Active Problem List   Diagnosis Date Noted  . Palliative care by specialist   . Goals of care, counseling/discussion   . Acute respiratory failure with hypoxia and hypercapnia (HCC)   . Atrial fibrillation ([place])   . Elevated d-dimer   . Anxiety state   . Nausea   . Acute on chronic respiratory failure with hypoxia ([place]) 08/21/2019  . 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 Intractable epigastric abdominal pain 01/04/2018  . COPD with acute exacerbation ([place]) 11/20/2017  . Morbid obesity with BMI of 50.0-59.9, adult ([place]) 11/20/2017  . Chest pain at rest 11/19/2017  . Varicose veins of bilateral lower extremities with other complications 17/61/6073  . Swelling of limb 10/10/2016   . Spider veins of both lower extremities 10/10/2016  . COPD exacerbation ([place]) 02/06/2016  . Acute respiratory failure with hypoxia ([place]) 02/06/2016  . Hypoxia 02/06/2016  .  COPD (chronic obstructive pulmonary disease) ([place])   . Chronic back pain   . Chronic obstructive pulmonary disease ([place])   . Chest pain 06/24/2015  . TIA (transient ischemic attack) 06/24/2015  . UTI (lower urinary tract infection) 06/24/2015  . Difficulty speaking   . Slurred speech   . Skin eruption 01/30/2013  . Obstructive sleep apnea   . Thoracic aortic aneurysm ([place]) 12/17/2012  . Paroxysmal supraventricular tachycardia-nonsustained 12/08/2012  . Morbid obesity ([place]) 12/08/2012  . Asthma, chronic 12/08/2012  . Hypertension 12/08/2012    Palliative Care Assessment & Plan   Patient Profile: 68 y.o. female  with past medical history of COPD on home oxygen 2L [place], OSA noncompliant with CPAP, asthma, GERD, lymphedema, anxiety, depression admitted on 09/02/2019 with shortness of breath, sats 70's at home on oxygen. Hospital admission for acute on chronic combined respiratory failure with elevated d-dimer, obese with sedentary lifestyle. Pt requesting hold off on CT angiogram until tomorrow. Oxygen saturations remain in 80's on HFNC, patient refusing BiPAP. Also with CHF exacerbation receiving IV lasix and afib RVR. Cardiology following. Palliative medicine consultation for goals of care.   Assessment: Acute on chronic respiratory failure COPD with chronic hypoxemia OSA Atypical pneumonia Acute on chronic diastolic CHF exacerbation New onset atrial fibrillation RVR Anxiety Physical deconditioning/sedentary lifestyle  Recommendations/Plan:  Transition to comfort focused care. Patient/family requesting hospice facility placement, understanding interventions not aimed at comfort will be discontinued when transferred. Patient is "ready" and accepting EOL is near.  Symptom management meds on MAR. RN to give prn  Roxanol and attempt weaning down from 15L.  Comfort feeds per patient/family request.  Discontinue BiPAP attempts.   SW consulted for hospice facility placement.  Allow family to visit as respiratory status remains tenuous. Patient at risk for decline at any time.    Code Status: DNR/DNI   Code Status Orders  (From admission, onward)         Start     Ordered   08/21/19 0042  Do not attempt resuscitation (DNR)  Continuous    Question Answer Comment  In the event of cardiac or respiratory ARREST Do not call a "code blue"   In the event of cardiac or respiratory ARREST Do not perform Intubation, CPR, defibrillation or ACLS   In the event of cardiac or respiratory ARREST Use medication by any route, position, wound care, and other measures to relive pain and suffering. May use oxygen, suction and manual treatment of airway obstruction as needed for comfort.      08/21/19 0041        Code Status History    Date Active Date Inactive Code Status Order ID Comments User Context   08/26/2019 1947 08/21/2019 0041 DNR [id number]  Ezequiel Essex, MD ED   01/04/2018 1022 01/07/2018 1435 DNR [id number]  Murlean Iba, MD Inpatient   11/20/2017 1617 11/20/2017 2041 DNR [id number]  Louellen Molder, MD Inpatient   11/19/2017 1210 11/20/2017 1617 Full Code [id number]  Louellen Molder, MD Inpatient   11/20/2016 1827 11/22/2016 1404 DNR [id number]  Oswald Hillock, MD ED   02/06/2016 2122 02/09/2016 1806 Full Code [id number]  Phillips Grout, MD Inpatient   06/24/2015 0216 06/25/2015 2120 Full Code [id number]  Theressa Millard, MD ED   06/24/2015 0209 06/24/2015 0216 Full Code [id number]  Theressa Millard, MD ED   12/08/2012 0024 12/09/2012 2131 Full Code [id number]  Karlyn Agee, MD Inpatient   Advance Care Planning Activity       Prognosis:  Poor prognosis likely weeks with chronic respiratory failure secondary to COPD/OSA/OHS. Patient continues to refuse BiPAP with recurrent hypercarbia. Declining  functional/cognitive/nutritional status.   Discharge Planning:  Hospice facility  Care plan was discussed with patient, daughters Sabrina Curry and Sabrina Curry), RN, Dr Manuella Ghazi  Thank you for allowing the Palliative Medicine Team to assist in the care of this patient.   Time In: 1045 Time Out: 1215 Total Time 90 Prolonged Time Billed  yes      Greater than 50%  of this time was spent counseling and coordinating care related to the above assessment and plan.  Ihor Dow, DNP, FNP-C Palliative Medicine Team  Phone: [phone] Fax: [phone]  Please contact Palliative Medicine Team phone at [phone] for questions and concerns.

## 2019-08-26 DIAGNOSIS — R0600 Dyspnea, unspecified: Secondary | ICD-10-CM

## 2019-08-26 LAB — SARS CORONAVIRUS 2 (TAT 6-24 HRS): SARS Coronavirus 2: NEGATIVE

## 2019-08-26 MED ORDER — MORPHINE SULFATE (PF) 2 MG/ML IV SOLN: 1.0000 mg | INTRAVENOUS | Status: DC | PRN

## 2019-08-26 MED ORDER — MORPHINE SULFATE (CONCENTRATE) 10 MG/0.5ML PO SOLN
10.0000 mg | ORAL | Status: DC | PRN
  Administered 2019-08-27: 10 mg via SUBLINGUAL
  Filled 2019-08-26: qty 0.5

## 2019-08-26 MED ORDER — MORPHINE SULFATE (CONCENTRATE) 10 MG/0.5ML PO SOLN
5.0000 mg | Freq: Four times a day (QID) | ORAL | Status: DC
  Administered 2019-08-26 – 2019-08-27 (×4): 5 mg via SUBLINGUAL
  Filled 2019-08-26 (×4): qty 0.5

## 2019-08-26 MED ORDER — MORPHINE SULFATE (PF) 2 MG/ML IV SOLN
1.0000 mg | INTRAVENOUS | Status: DC | PRN
  Administered 2019-08-26: 2 mg via INTRAVENOUS
  Administered 2019-08-27: 1 mg via INTRAVENOUS
  Filled 2019-08-26 (×2): qty 1

## 2019-08-26 MED ORDER — LORAZEPAM 2 MG/ML IJ SOLN
0.5000 mg | INTRAMUSCULAR | Status: DC | PRN
  Administered 2019-08-27: 0.5 mg via INTRAVENOUS
  Filled 2019-08-26: qty 1

## 2019-08-26 NOTE — Progress Notes (Addendum)
Daily Progress Note   Patient Name: Sabrina Curry       Date: 08/26/2019 DOB: 1951/02/15  Age: 68 y.o. MRN#: [id number] Attending Physician: Rodena Goldmann, DO Primary Care Physician: Rosita Fire, MD Admit Date: 08/31/2019  Reason for Consultation/Follow-up: Establishing goals of care  Subjective: 1031: Patient awake, alert. C/o of shortness of breath. Just given prn Roxanol by RN. Remains on 15L and felt short of breath yesterday when RN attempted to wean.   No family at bedside. Spoke with daughter, [name] via telephone to update on plan of care. Pending covid test. Also, hospice facility unable to accept patient until tolerating 10L [place]. Recommending scheduling liquid morphine to better manage symptoms of dyspnea with underlying chronic respiratory failure. Patient and daughter agree with this plan. Also discussed q2h breakthrough morphine as needed. Answered questions and concerns. Daughter has PMT contact information.   ADDENDUM 1515: F/u with daughter, [name] at bedside. Patient appears to be actively dying. Lethargic with sats 69% on 15L [place]. She appears comfortable and daughter agrees that she looks comfortable. Frankly and compassionately explained to [name] that her mother is too unstable for transfer to hospice facility and will pass away in the hospital. [name] understands and reiterates her wishes for her mother to be comfortable and not to suffer at the end. Discussed plan for ongoing morphine as needed to ensure comfort. [name] requests RN start weaning oxygen down when she arrives back to the hospital. [name] does not want this process prolonged for her mother. [name] shares that her mother said she was "tired" earlier and feels she is ready and at peace with EOL. Spiritual/emotional  support provided to daughter. Discussed with RN at bedside. Updated Dr. Manuella Ghazi via secure chat.  Length of Stay: 5  Current Medications: Scheduled Meds:   chlorhexidine  15 mL Mouth Rinse BID   diltiazem  120 mg Oral Daily   isosorbide mononitrate  30 mg Oral Daily   mouth rinse  15 mL Mouth Rinse BID   metoprolol tartrate  75 mg Oral BID   morphine CONCENTRATE  5 mg Sublingual Q6H   nystatin   Topical BID   sodium chloride flush  3 mL Intravenous Q12H    Continuous Infusions:  sodium chloride      PRN Meds: sodium chloride, acetaminophen **OR** acetaminophen, alum &  mag hydroxide-simeth, bisacodyl, ibuprofen, levalbuterol, LORazepam, morphine injection, morphine CONCENTRATE, polyethylene glycol, promethazine, sodium chloride, sodium chloride flush, traMADol  Physical Exam Vitals signs and nursing note reviewed.  Constitutional:      Appearance: She is ill-appearing.  Pulmonary:     Effort: No tachypnea, accessory muscle usage or respiratory distress.     Comments: 15L Skin:    General: Skin is warm and dry.  Neurological:     Mental Status: She is alert, oriented to person, place, and time and easily aroused.     Comments: Intermittent confusion/lethargy            Vital Signs: BP 108/74 (BP Location: Right Arm)    Pulse (!) 58    Temp 97.7 F (36.5 C) (Oral)    Resp 16    Ht 4' 9.99" (1.473 m)    Wt (!) 138.1 kg    SpO2 90%    BMI 63.65 kg/m  SpO2: SpO2: 90 % O2 Device: O2 Device: High Flow Nasal Cannula O2 Flow Rate: O2 Flow Rate (L/min): 15 L/min  Intake/output summary:   Intake/Output Summary (Last 24 hours) at 08/26/2019 1040 Last data filed at 08/26/2019 0900 Gross per 24 hour  Intake 920 ml  Output 1750 ml  Net -830 ml   LBM: Last BM Date: 08/26/19 Baseline Weight: Weight: (!) 148.4 kg Most recent weight: Weight: (!) 138.1 kg       Palliative Assessment/Data: PPS 20%      Patient Active Problem List   Diagnosis Date Noted   Atypical  pneumonia    Palliative care by specialist    Terminal care    Acute respiratory failure with hypoxia and hypercapnia (HCC)    Atrial fibrillation (HCC)    Elevated d-dimer    Anxiety state    Nausea    Acute on chronic respiratory failure with hypoxia ([place]) 08/21/2019   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[place]) 01/05/2018   Intractable epigastric abdominal pain 01/04/2018   COPD with acute exacerbation ([place]) 11/20/2017   Morbid obesity with BMI of 50.0-59.9, adult ([place]) 11/20/2017   Chest pain at rest 11/19/2017   Varicose veins of bilateral lower extremities with other complications 24/58/0998   Swelling of limb 10/10/2016   Spider veins of both lower extremities 10/10/2016   COPD exacerbation ([place]) 02/06/2016   Acute respiratory failure with hypoxia ([place]) 02/06/2016   Hypoxia 02/06/2016   COPD (chronic obstructive pulmonary disease) (HCC)    Chronic back pain    Chronic obstructive pulmonary disease ([place])    Chest pain 06/24/2015   TIA (transient ischemic attack) 06/24/2015   UTI (lower urinary tract infection) 06/24/2015   Difficulty speaking    Slurred speech    Skin eruption 01/30/2013   Obstructive sleep apnea    Thoracic aortic aneurysm ([place]) 12/17/2012   Paroxysmal supraventricular tachycardia-nonsustained 12/08/2012   Morbid obesity ([place]) 12/08/2012   Asthma, chronic 12/08/2012   Hypertension 12/08/2012    Palliative Care Assessment & Plan   Patient Profile: 68 y.o. female  with past medical history of COPD on home oxygen 2L [place], OSA noncompliant with CPAP, asthma, GERD, lymphedema, anxiety, depression admitted on 09/11/2019 with shortness of breath, sats 70's at home on oxygen. Hospital admission for acute on chronic combined respiratory failure with elevated d-dimer, obese with sedentary lifestyle. Pt requesting hold off on CT angiogram until tomorrow. Oxygen saturations remain in 80's on HFNC, patient refusing BiPAP. Also with CHF  exacerbation receiving IV lasix and afib RVR. Cardiology following.  Palliative medicine consultation for goals of care.   Assessment: Acute on chronic respiratory failure COPD with chronic hypoxemia OSA Atypical pneumonia Acute on chronic diastolic CHF exacerbation New onset atrial fibrillation RVR Anxiety Physical deconditioning/sedentary lifestyle  Recommendations/Plan:  Transition to comfort focused care. Patient/family requesting hospice facility placement, understanding interventions not aimed at comfort will be discontinued when transferred. Patient is "ready" and accepting EOL is near.  Symptom management meds on MAR. Scheduled Roxanol 14m q6h. Patient remains short of breath on 15L.   Comfort feeds per patient/family request.  Discontinue BiPAP attempts.   Pending repeat covid test.  SW consulted for hospice facility placement. Pt needs to wean down to 10L before hospice facility will accept.   Allow family to visit as respiratory status remains tenuous. Patient at risk for decline at any time.   ADDENDUM 1515: Patient actively dying. Unstable for hospice transfer. Updated daughter at bedside. Anticipate hospital death. Continue morphine as needed for comfort.   Code Status: DNR/DNI   Code Status Orders  (From admission, onward)         Start     Ordered   08/21/19 0042  Do not attempt resuscitation (DNR)  Continuous    Question Answer Comment  In the event of cardiac or respiratory ARREST Do not call a code blue   In the event of cardiac or respiratory ARREST Do not perform Intubation, CPR, defibrillation or ACLS   In the event of cardiac or respiratory ARREST Use medication by any route, position, wound care, and other measures to relive pain and suffering. May use oxygen, suction and manual treatment of airway obstruction as needed for comfort.      08/21/19 0041        Code Status History    Date Active Date Inactive Code Status Order ID Comments User  Context   09/16/2019 1947 08/21/2019 0041 DNR [id number] REzequiel Essex MD ED   01/04/2018 1022 01/07/2018 1435 DNR [id number] JMurlean Iba MD Inpatient   11/20/2017 1617 11/20/2017 2041 DNR [id number] DLouellen Molder MD Inpatient   11/19/2017 1210 11/20/2017 1617 Full Code [id number] DLouellen Molder MD Inpatient   11/20/2016 1827 11/22/2016 1404 DNR [id number] LOswald Hillock MD ED   02/06/2016 2122 02/09/2016 1806 Full Code [id number] DPhillips Grout MD Inpatient   06/24/2015 0216 06/25/2015 2120 Full Code [id number] JTheressa Millard MD ED   06/24/2015 0209 06/24/2015 0216 Full Code [id number] JTheressa Millard MD ED   12/08/2012 0024 12/09/2012 2131 Full Code [id number] CKarlyn Agee MD Inpatient   Advance Care Planning Activity       Prognosis:  Poor prognosis likely hours with chronic respiratory failure secondary to COPD/OSA/OHS. Patient continues to refuse BiPAP with recurrent hypercarbia. Declining functional/cognitive/nutritional status.   Discharge Planning:  Anticipated Hospital Death  Care plan was discussed with patient, daughter ([name], RN, Dr SManuella Ghazi Thank you for allowing the Palliative Medicine Team to assist in the care of this patient.   Time In: 10923 13007 Time Out: 16226 3335Total Time 50 Prolonged Time Billed  no      Greater than 50%  of this time was spent counseling and coordinating care related to the above assessment and plan.  MIhor Dow DNP, FNP-C Palliative Medicine Team  Phone: [phone]: [phone] Please contact Palliative Medicine Team phone at [phone] questions and concerns.

## 2019-08-26 NOTE — TOC Progression Note (Addendum)
Transition of Care [facility]) - Progression Note    Patient Details  Name: Sabrina Curry MRN: [id number] Date of Birth: 1951/06/18  Transition [facility]) CM/SW Contact  Shade Flood, LCSW Phone Number: 08/26/2019, 10:14 AM  Clinical Narrative:     Spoke with Cassandra at [facility]. Pt's O2 requirement has to be at 10L or less to admit to the residence. Pt currently still on 15L HFNC. Updated MD. Will assist with transition of care needs when appropriate.   Expected Discharge Plan: [place] Barriers to Discharge: Continued Medical Work up, SNF Pending bed offer  Expected Discharge Plan and Services Expected Discharge Plan: [place]   Discharge Planning Services: CM Consult   Living arrangements for the past 2 months: Single Family Home                                       Social Determinants of Health (SDOH) Interventions    Readmission Risk Interventions No flowsheet data found.

## 2019-08-26 NOTE — Progress Notes (Signed)
PROGRESS NOTE    Sabrina Curry  XHB:[id number] DOB: 09-Apr-1951 DOA: 08/30/2019 PCP: Rosita Fire, MD   Brief Narrative:  Per HPI: Sabrina Curry a 68 y.o.femalewith medical history significant ofasthma, COPD, GERD, lymphedema, anxiety, depression, obstructive sleep apnea who presented to the ER with worsening shortness of breath.Patient has had relatively sudden onset shortness of breath over the last 2 days. Usually on 2 L of home oxygen. Noted to be satting in the 70s on oxygen at home and had to be placed on 15 L nonrebreather. Denies any history of chest pain. Has had worsening lower extremity edema over the last several weeks. Also reports having abdominal wall edema. Usually ambulates with a walker at home but has had significant decline in activity. Has had orthopnea as well. Does have COPD and obstructive sleep apnea and is supposed to be on CPAP at home but is unable to use it due to claustrophobia and adamantly refuses it in the ER as well. She is also a DNR and does not wish any aggressive interventions.  12/3:Patient was admitted with acute on chronic hypoxemic and hypercapnic respiratory failure in severe respiratory distress. She refuses BiPAP or intubation and is DNR. She continues to have significant nasal cannula oxygen requirements and is nearly on 10 L. She was started on Cardizem drip as well as heparin for new onset atrial fibrillation with RVR as well as some IV Lasix for acute CHF exacerbation with preserved EF. She appears to be diuresing quite well and has almost 1.9 L of net negative fluid balance thus far. We will continue diuresis with IV Lasix. [place] cardiology consultation with 2D echocardiogram pending. She has been started on oral Cardizem on top of her home metoprolol and we will plan to try and wean IV Cardizem as tolerated. Anticipate CT chest angiogram for PE evaluation when stable enough to do so. PT/OT evaluation pending and will likely  require PT on discharge. Follow a.m. labs.  12/4:Patient has been evaluated by PT with recommendations for SNF on discharge. She has been weaned off of her IV Cardizem with improved heart rate control on oral Cardizem as well as metoprolol. Cardizem has been consolidated to 120 mg daily by cardiology. Patient still remains fairly hypoxemic and short of breath and on high flow nasal cannula oxygen. Total of approximately 4 L net negative fluid balance noted thus far. 2D echocardiogram with LVEF 55-60% and no RV strain noted. Patient request to have CT angiogram study tomorrow if possible. She remains on heparin drip. Palliative care consulted today for goals of care discussion.  12/5:Patient has had CT angiogram today with findings of atypical/viral pneumonia. Covid testing has been negative on admission. She continues to have significant hypoxemia and will plan to increase IV Solu-Medrol today and check respiratory viral panel as well as influenza testing. Procalcitonin low with no leukocytosis noted. Will avoid IV antibiotics.  12/6:Patient continues to have high oxygen requirements and continues to have hypercapnia and now, what appears to be some encephalopathy related to this. Family members are at bedside and understand that she would likely transition to comfort measures soon. Palliative care returns tomorrow and will anticipate discharge to either hospice facility versus hospice home at that time.  12/7: Patient continues with high oxygen requirements as well as some confusion.  Seen by palliative care today and transitioned to comfort care.  Plans for discharge to hospice facility once bed has been arranged.  12/8: Patient continues to have high oxygen requirements and is on 15  L high flow nasal cannula oxygen.  Unfortunately, she cannot go to hospice facility with such high oxygen requirements and needs to come down to 10 L prior to discharge.  Palliative care has started  patient on oral morphine to see if dyspnea will improve some and if patient can be weaned further.  Working on disposition as she is currently comfort care.  Assessment & Plan:   Active Problems:   Morbid obesity ([place])   Hypertension   Obstructive sleep apnea   Thoracic aortic aneurysm (HCC)   COPD exacerbation (HCC)   Acute respiratory failure with hypoxia (HCC)   Acute on chronic respiratory failure with hypoxia (HCC)   Palliative care by specialist   Terminal care   Acute respiratory failure with hypoxia and hypercapnia (HCC)   Atrial fibrillation (HCC)   Elevated d-dimer   Anxiety state   Nausea   Atypical pneumonia   Acute on chronic combined respiratory failure-persistent -Patient with elevated D-dimer and noted to be obese and sedentary -CT angiogram today with no findings of PE, but rather atypical pneumonia -Covid testing negative, influenza negative, respiratory viral panel negative -Working on actively weaning oxygen levels -Repeat testing for Covid negative after exposure by family member  Acute on chronic diastolic CHF exacerbation -2D echocardiogram with LVEF 55-60% and indeterminate diastolic function -No further Lasix at this time  New onset atrial fibrillation with RVR-currently controlled -Patient is currently on Lopressor 75 mg twice daily as well as diltiazem 30 mg every 6 hours which will be consolidated to 120 mg daily per cardiology -CHA2DS2-VASc score is 6, but patient now off Eliquis  COPD with chronic hypoxemia -Breathing treatments for shortness of breath and wheezing  Obstructive sleep apnea -Refuses CPAP or BiPAP  Chronic macrocytic anemia -Appears stable on repeat CBC -Noted to have iron deficiency anemia and received Feraheme on 12/3  Mild thrombocytopenia-stable  Anxiety -Continue Ativan as needed  GERD  Chronic physical deconditioning/sedentary  Patient now on comfort measures care.  Seeking hospice facility placement.   Working actively on weaning oxygen levels.   DVT prophylaxis:None Code Status:DNR Family Communication:Daughter at bedside on 12/7 Disposition Plan:Plan todischarge to hospice facility once bed has been arranged and oxygen weaned to appropriate level 10 L or less.   Consultants:  Cardiology  Palliative care  Procedures:  None  Antimicrobials:  Anti-infectives (From admission, onward)   Start     Dose/Rate Route Frequency Ordered Stop   08/21/2019 2200  doxycycline (VIBRA-TABS) tablet 100 mg     100 mg Oral  Once 09/16/2019 2153 08/21/2019 2333       Subjective: Patient seen and evaluated today with no new acute complaints or concerns. No acute concerns or events noted overnight.  She remains on high flow nasal cannula oxygen at 15 L.  Objective: Vitals:   08/25/19 1517 08/25/19 2104 08/26/19 0522 08/26/19 0936  BP:  (!) 139/93 108/74   Pulse:  80 (!) 59 (!) 58  Resp:  18 16   Temp:  98.1 F (36.7 C) 97.7 F (36.5 C)   TempSrc:  Oral Oral   SpO2: (!) 84% 91% 90%   Weight:   (!) 138.1 kg   Height:        Intake/Output Summary (Last 24 hours) at 08/26/2019 1350 Last data filed at 08/26/2019 0900 Gross per 24 hour  Intake 920 ml  Output 1750 ml  Net -830 ml   Filed Weights   08/23/19 0214 08/24/19 0415 08/26/19 0522  Weight: (!) 139.5  kg (!) 138.4 kg (!) 138.1 kg    Examination:  General exam: Appears calm and comfortable, morbidly obese Respiratory system: Clear to auscultation. Respiratory effort normal.  Currently on 15 L nasal cannula oxygen. Cardiovascular system: S1 & S2 heard, RRR. No JVD, murmurs, rubs, gallops or clicks.  Chronic lower extremity edema. Gastrointestinal system: Abdomen is nondistended, soft and nontender. No organomegaly or masses felt. Normal bowel sounds heard. Central nervous system: Alert and awake. Extremities: Significant, chronic bilateral edema. Skin: No rashes, lesions or ulcers Psychiatry: Judgement and insight  appear normal. Mood & affect appropriate.     Data Reviewed: I have personally reviewed following labs and imaging studies  CBC: Recent Labs  Lab 08/22/2019 1907 08/21/19 0649 08/22/19 0526 08/23/19 0756 08/24/19 0600 08/25/19 0440  WBC 6.5 8.8 7.5 7.1 5.3 6.4  NEUTROABS 4.8  --   --   --   --   --   HGB 11.9* 11.1* 10.9* 12.3 11.1* 11.4*  HCT 43.5 39.2 38.2 44.4 39.1 38.8  MCV 114.5* 111.4* 110.4* 113.3* 110.1* 106.6*  PLT 118* 124* 119* 118* 117* 875*   Basic Metabolic Panel: Recent Labs  Lab 09/14/2019 1907 08/21/19 0649 08/22/19 0526 08/23/19 0756 08/24/19 0600  NA 144 144 143 141 141  K 5.2* 4.4 4.3 4.6 4.0  CL 99 94* 89* 85* 80*  CO2 35* 40* 41* 49* 46*  GLUCOSE 115* 139* 123* 121* 123*  BUN 20 21 25* 28* 36*  CREATININE 0.95 1.09* 1.10* 1.15* 1.20*  CALCIUM 9.2 8.9 9.1 9.1 9.2  MG  --   --  1.9 1.9  --    GFR: Estimated Creatinine Clearance: 58.1 mL/min (A) (by C-G formula based on SCr of 1.2 mg/dL (H)). Liver Function Tests: No results for input(s): AST, ALT, ALKPHOS, BILITOT, PROT, ALBUMIN in the last 168 hours. No results for input(s): LIPASE, AMYLASE in the last 168 hours. No results for input(s): AMMONIA in the last 168 hours. Coagulation Profile: No results for input(s): INR, PROTIME in the last 168 hours. Cardiac Enzymes: No results for input(s): CKTOTAL, CKMB, CKMBINDEX, TROPONINI in the last 168 hours. BNP (last 3 results) No results for input(s): PROBNP in the last 8760 hours. HbA1C: No results for input(s): HGBA1C in the last 72 hours. CBG: No results for input(s): GLUCAP in the last 168 hours. Lipid Profile: No results for input(s): CHOL, HDL, LDLCALC, TRIG, CHOLHDL, LDLDIRECT in the last 72 hours. Thyroid Function Tests: No results for input(s): TSH, T4TOTAL, FREET4, T3FREE, THYROIDAB in the last 72 hours. Anemia Panel: No results for input(s): VITAMINB12, FOLATE, FERRITIN, TIBC, IRON, RETICCTPCT in the last 72 hours. Sepsis Labs: Recent  Labs  Lab 09/06/2019 1907 09/15/2019 2119 08/23/19 0756  PROCALCITON  --   --  <0.10  LATICACIDVEN 1.3 0.9  --     Recent Results (from the past 240 hour(s))  Blood culture (routine x 2)     Status: None   Collection Time: 09/11/2019  5:37 PM   Specimen: BLOOD RIGHT HAND  Result Value Ref Range Status   Specimen Description BLOOD RIGHT HAND  Final   Special Requests   Final    BOTTLES DRAWN AEROBIC ONLY Blood Culture adequate volume   Culture   Final    NO GROWTH 5 DAYS Performed at [facility], [address] [place], [place] [address]    Report Status 08/25/2019 FINAL  Final  Blood culture (routine x 2)     Status: None   Collection Time: 09/03/2019  5:42 PM   Specimen: BLOOD RIGHT HAND  Result Value Ref Range Status   Specimen Description BLOOD RIGHT HAND  Final   Special Requests   Final    BOTTLES DRAWN AEROBIC ONLY Blood Culture adequate volume   Culture   Final    NO GROWTH 5 DAYS Performed at [facility], [address] [place], [place] [address]    Report Status 08/25/2019 FINAL  Final  SARS Coronavirus 2 by RT PCR (hospital order, performed in [facility] hospital lab) Nasopharyngeal Nasopharyngeal Swab     Status: None   Collection Time: 09/13/2019  5:58 PM   Specimen: Nasopharyngeal Swab  Result Value Ref Range Status   SARS Coronavirus 2 NEGATIVE NEGATIVE Final    Comment: (NOTE) SARS-CoV-2 target nucleic acids are NOT DETECTED. The SARS-CoV-2 RNA is generally detectable in upper and lower respiratory specimens during the acute phase of infection. The lowest concentration of SARS-CoV-2 viral copies this assay can detect is 250 copies / mL. A negative result does not preclude SARS-CoV-2 infection and should not be used as the sole basis for treatment or other patient management decisions.  A negative result may occur with improper specimen collection / handling, submission of specimen other than nasopharyngeal swab, presence of viral mutation(s) within the areas  targeted by this assay, and inadequate number of viral copies (<250 copies / mL). A negative result must be combined with clinical observations, patient history, and epidemiological information. Fact Sheet for Patients:   StrictlyIdeas.no Fact Sheet for Healthcare Providers: BankingDealers.co.za This test is not yet approved or cleared  by the [place] FDA and has been authorized for detection and/or diagnosis of SARS-CoV-2 by FDA under an Emergency Use Authorization (EUA).  This EUA will remain in effect (meaning this test can be used) for the duration of the COVID-19 declaration under Section 564(b)(1) of the Act, 21 U.S.C. section 360bbb-3(b)(1), unless the authorization is terminated or revoked sooner. Performed at [facility], [address], [place], [place] [address]   MRSA PCR Screening     Status: Abnormal   Collection Time: 08/21/19  1:38 AM   Specimen: Nasal Mucosa; Nasopharyngeal  Result Value Ref Range Status   MRSA by PCR POSITIVE (A) NEGATIVE Final    Comment:        The GeneXpert MRSA Assay (FDA approved for NASAL specimens only), is one component of a comprehensive MRSA colonization surveillance program. It is not intended to diagnose MRSA infection nor to guide or monitor treatment for MRSA infections. RESULT CALLED TO, READ BACK BY AND VERIFIED WITH: LASHLEY S. AT 0816AM ON [id number] BY THOMPSON S. Performed at [facility], [address] [place], [place] [address]   Respiratory Panel by PCR     Status: None   Collection Time: 08/23/19 11:48 AM   Specimen: Nasopharyngeal Swab; Respiratory  Result Value Ref Range Status   Adenovirus NOT DETECTED NOT DETECTED Final   Coronavirus 229E NOT DETECTED NOT DETECTED Final    Comment: (NOTE) The Coronavirus on the Respiratory Panel, DOES NOT test for the novel  Coronavirus (2019 nCoV)    Coronavirus HKU1 NOT DETECTED NOT DETECTED Final   Coronavirus NL63 NOT  DETECTED NOT DETECTED Final   Coronavirus OC43 NOT DETECTED NOT DETECTED Final   Metapneumovirus NOT DETECTED NOT DETECTED Final   Rhinovirus / Enterovirus NOT DETECTED NOT DETECTED Final   Influenza A NOT DETECTED NOT DETECTED Final   Influenza B NOT DETECTED NOT DETECTED Final   Parainfluenza Virus 1  NOT DETECTED NOT DETECTED Final   Parainfluenza Virus 2 NOT DETECTED NOT DETECTED Final   Parainfluenza Virus 3 NOT DETECTED NOT DETECTED Final   Parainfluenza Virus 4 NOT DETECTED NOT DETECTED Final   Respiratory Syncytial Virus NOT DETECTED NOT DETECTED Final   Bordetella pertussis NOT DETECTED NOT DETECTED Final   Chlamydophila pneumoniae NOT DETECTED NOT DETECTED Final   Mycoplasma pneumoniae NOT DETECTED NOT DETECTED Final    Comment: Performed at [facility], [place] [address], [place], [place] [address]  SARS CORONAVIRUS 2 (TAT 6-24 HRS) Nasopharyngeal Nasopharyngeal Swab     Status: None   Collection Time: 08/25/19  3:08 PM   Specimen: Nasopharyngeal Swab  Result Value Ref Range Status   SARS Coronavirus 2 NEGATIVE NEGATIVE Final    Comment: (NOTE) SARS-CoV-2 target nucleic acids are NOT DETECTED. The SARS-CoV-2 RNA is generally detectable in upper and lower respiratory specimens during the acute phase of infection. Negative results do not preclude SARS-CoV-2 infection, do not rule out co-infections with other pathogens, and should not be used as the sole basis for treatment or other patient management decisions. Negative results must be combined with clinical observations, patient history, and epidemiological information. The expected result is Negative. Fact Sheet for Patients: SugarRoll.be Fact Sheet for Healthcare Providers: https://www.woods-mathews.com/ This test is not yet approved or cleared by the [place] FDA and  has been authorized for detection and/or diagnosis of SARS-CoV-2 by FDA under an Emergency Use  Authorization (EUA). This EUA will remain  in effect (meaning this test can be used) for the duration of the COVID-19 declaration under Section 56 4(b)(1) of the Act, 21 U.S.C. section 360bbb-3(b)(1), unless the authorization is terminated or revoked sooner. Performed at [facility], [place] [address], [place], [place] [address]          Radiology Studies: No results found.      Scheduled Meds:  chlorhexidine  15 mL Mouth Rinse BID   diltiazem  120 mg Oral Daily   isosorbide mononitrate  30 mg Oral Daily   mouth rinse  15 mL Mouth Rinse BID   metoprolol tartrate  75 mg Oral BID   morphine CONCENTRATE  5 mg Sublingual Q6H   nystatin   Topical BID   sodium chloride flush  3 mL Intravenous Q12H   Continuous Infusions:  sodium chloride       LOS: 5 days    Time spent: 30 minutes    Tailey Top Darleen Crocker, DO [facility] Pager [phone]  If 7PM-7AM, please contact night-coverage [email or web address] Password [email or web address] 08/26/2019, 1:50 PM

## 2019-08-26 NOTE — Progress Notes (Signed)
Pt noted to sleep most of night, able to awaken but would fall back asleep in a short amount of time. Mouth-breathing and sats ranging from 88%-90%. Does not appear to be in pain, no non-verbal indications noted. Will continue to monitor.

## 2019-08-27 DIAGNOSIS — R092 Respiratory arrest: Secondary | ICD-10-CM

## 2019-08-27 MED ORDER — MORPHINE BOLUS VIA INFUSION
1.0000 mg | INTRAVENOUS | Status: DC | PRN
  Administered 2019-08-27 (×3): 4 mg via INTRAVENOUS
  Administered 2019-08-27: 2 mg via INTRAVENOUS
  Administered 2019-08-27: 4 mg via INTRAVENOUS
  Filled 2019-08-27: qty 4

## 2019-08-27 MED ORDER — LORAZEPAM 2 MG/ML IJ SOLN: 0.5000 mg | INTRAMUSCULAR | Status: DC | PRN

## 2019-08-27 MED ORDER — GLYCOPYRROLATE 0.2 MG/ML IJ SOLN
0.1000 mg | Freq: Once | INTRAMUSCULAR | Status: AC
  Administered 2019-08-27: 0.1 mg via INTRAVENOUS
  Filled 2019-08-27: qty 1

## 2019-08-27 MED ORDER — MORPHINE 100MG IN NS 100ML (1MG/ML) PREMIX INFUSION
3.0000 mg/h | INTRAVENOUS | Status: DC
  Administered 2019-08-27: 1 mg/h via INTRAVENOUS
  Filled 2019-08-27: qty 100

## 2019-08-27 MED ORDER — PANTOPRAZOLE SODIUM 40 MG IV SOLR: 40.0000 mg | Freq: Two times a day (BID) | INTRAVENOUS | Status: DC

## 2019-08-27 MED ORDER — HYDROMORPHONE HCL 1 MG/ML IJ SOLN
0.5000 mg | INTRAMUSCULAR | Status: DC | PRN
  Filled 2019-08-27: qty 0.5

## 2019-08-27 MED ORDER — HALOPERIDOL LACTATE 5 MG/ML IJ SOLN: 2.0000 mg | Freq: Four times a day (QID) | INTRAMUSCULAR | Status: DC | PRN

## 2019-08-27 MED ORDER — LORAZEPAM 2 MG/ML IJ SOLN: 1.0000 mg | INTRAMUSCULAR | Status: DC | PRN

## 2019-08-27 MED ORDER — METOPROLOL TARTRATE 5 MG/5ML IV SOLN
5.0000 mg | Freq: Three times a day (TID) | INTRAVENOUS | Status: DC | PRN
  Administered 2019-08-27: 5 mg via INTRAVENOUS
  Filled 2019-08-27: qty 5

## 2019-08-27 MED ORDER — LORAZEPAM 2 MG/ML IJ SOLN
1.0000 mg | INTRAMUSCULAR | Status: DC | PRN
  Administered 2019-08-27: 2 mg via INTRAVENOUS
  Filled 2019-08-27: qty 1

## 2019-08-27 MED ORDER — SCOPOLAMINE 1 MG/3DAYS TD PT72: 1.0000 | MEDICATED_PATCH | TRANSDERMAL | Status: DC

## 2019-08-27 MED ORDER — HYDROMORPHONE HCL 1 MG/ML IJ SOLN
0.5000 mg | INTRAMUSCULAR | Status: DC | PRN
  Administered 2019-08-27: 0.5 mg via INTRAVENOUS
  Filled 2019-08-27: qty 0.5

## 2019-08-27 MED ORDER — LORAZEPAM 2 MG/ML IJ SOLN: 2.0000 mg | INTRAMUSCULAR | Status: DC | PRN

## 2019-08-27 MED ORDER — GLYCOPYRROLATE 0.2 MG/ML IJ SOLN: 0.3000 mg | INTRAMUSCULAR | Status: DC | PRN

## 2019-09-19 NOTE — Progress Notes (Signed)
Pt moaning softly upon entrance to room. Rubbing abdomen. When asked if she has pain, pt states, "A little bit." Resp 28, slightly labored. SaO2 82% 10 lpm HFNC. Pulse 92/min and irregular. Cap refill < 3sec. Morphine 1 mg IV given for c/o pain. Oral care provided as well.

## 2019-09-19 NOTE — Progress Notes (Signed)
Family members leaving at this time. Patient['s rings were removed by patient's daughter and placed in plastic bag. Daughter took rings with her. Pt's daughter asked if pt would be candidate for any organ or tissue donation. Advised daughter that [facility] would notify us if her mother was suitable for any donation. Daughter confirmed that [facility] was to [facility] called to pick [facility] her mother's body.

## 2019-09-19 NOTE — TOC Progression Note (Signed)
Transition of Care [facility]) - Progression Note    Patient Details  Name: Sabrina Curry MRN: [id number] Date of Birth: 1951-06-30  Transition of Care [facility] CM/SW Contact  Teresa Lemmerman, Chauncey Reading, RN Phone Number: 2019/09/17, 3:42 PM  Clinical Narrative:   Patient now on 10 liters and maintaining sats in the 70's. Verified with [facility] of hospice home that they do have beds available. Jinny Blossom, palliative NP discussing with family.     Expected Discharge Plan: [place] Barriers to Discharge: Continued Medical Work up, SNF Pending bed offer  Expected Discharge Plan and Services Expected Discharge Plan: [place]   Discharge Planning Services: CM Consult   Living arrangements for the past 2 months: Single Family Home                                       Social Determinants of Health (SDOH) Interventions    Readmission Risk Interventions No flowsheet data found.

## 2019-09-19 NOTE — Progress Notes (Signed)
Pt slept throughout night, sats ranging from 84-86% on 15liters high flow. Scheduled morphine given as ordered, no verbal or non-verbal indications of pain noted, appears comfortable, re-adjusted in bed as needed. Able to arouse, pt will ask for water and go back to sleep afterwards. Will continue to monitor.

## 2019-09-19 NOTE — Progress Notes (Signed)
Pt was on Morphine drip prior to death. 34 ml of drip infused, other 66 ml wasted in sink with volume wasted verified by both myself and Elodia Florence, RN.

## 2019-09-19 NOTE — Progress Notes (Signed)
Family members at bedside. Pt awake, alert, oriented to person/place and time. Knows each family member by name. Denies any complaint of pain at this time. Resp even, slightly labored. Skin warm and dry, cap refill <3 sec. Current SaO2 on 10l HFNC is 78%. Morphine drip started per order. Family and patient educated on medication.

## 2019-09-19 NOTE — Progress Notes (Signed)
Earlier pt had 38 - 61 second period of seizure activity. MD notified, Ativan IV given. Pt currently with 30 - 45 second periods of apnea. Unresponsive at present. Respirations ragged sounding, approx 8/min. Pt with occasional moaning noted. Megan, NP from palliative care in earlier to assess pt and to talk with family. Additional orders for robinul and increased frequency for Dilaudid given. Robinul given and respirations less wet sounding, less gurgling. Continues with cyanosis of hands/fingertips and toes. Apical heart rate 105/min and irregular. Morphine drip continues. Family at bedside, comfort and support offered. Questions answered. Will continue to monitor pt's breathing and transition status.

## 2019-09-19 NOTE — Progress Notes (Signed)
Called to room by family. Upon arrival, daughter states, "I don't think she's breathing anymore. Will you check her?" Pt assessed, no heart tones auscultated, no respirations noted, pupils fixed. Lack of heart sounds and absent respirations second verified by Elodia Florence, RN.  Family notified that pt is deceased and sympathy offered to each. Daughter states, "It was so peaceful, she just slipped away."   Admin supervisor Tiffany, RN, charge nurse Evalyn Casco, RN and MD Dr. Billie Ruddy notified of patient's death. [facility] called and notified. First referral number obtained and documented on donor services form.

## 2019-09-19 NOTE — Progress Notes (Addendum)
Daily Progress Note   Patient Name: Sabrina Curry       Date: 04-Sep-2019 DOB: 1951-06-07  Age: 69 y.o. MRN#: [id number] Attending Physician: Enzo Bi, MD Primary Care Physician: Rosita Fire, MD Admit Date: 08/28/2019  Reason for Consultation/Follow-up: Establishing goals of care  Subjective/GOC:  Checked in on patient multiple times this afternoon. Patient intermittently confused. Asking for family. Complaining of abdominal pain and shortness of breath not relieved by scheduled and prn morphine this AM.   Spoke with [name] via telephone this AM and in person this afternoon. [name] agrees with initiation of morphine infusion to better manage her mother's comfort and to ensure she does not suffer. Reiterated focus on comfort level by assessment over worrying about numbers, since these will change as she nears EOL. (Family has wanted to keep her on O2 monitoring this afternoon to see oxygen saturation and pulse rate). [name] and I are surprised Keayra survived the night, as she appeared to be actively dying last night. Encouraged weaning of oxygen when her mother is comfortable, in order to not prolong this process for her.   Symptom management assist throughout the day. Discussed with RN multiple times.   ADDENDUM [id number]: Patient appears to be actively dying. Family witnessed seizure-like activity. Patient moaning intermittently with accessory muscle use. Periods of apnea. Audible secretions. Stayed at bedside for symptom management assist. Emotional/spiritual support offered to family. Therapeutic listening. Family prepared for prognosis of possibly minutes-hours.   Length of Stay: 6  Current Medications: Scheduled Meds:  . chlorhexidine  15 mL Mouth Rinse BID  . mouth rinse  15 mL Mouth  Rinse BID  . morphine CONCENTRATE  5 mg Sublingual Q6H  . nystatin   Topical BID  . sodium chloride flush  3 mL Intravenous Q12H    Continuous Infusions: . sodium chloride      PRN Meds: sodium chloride, acetaminophen **OR** acetaminophen, alum & mag hydroxide-simeth, bisacodyl, levalbuterol, LORazepam, LORazepam, morphine injection, morphine CONCENTRATE, polyethylene glycol, promethazine, sodium chloride, sodium chloride flush  Physical Exam Vitals signs and nursing note reviewed.  Constitutional:      Appearance: She is ill-appearing.  Cardiovascular:     Rate and Rhythm: Rhythm irregularly irregular.     Comments: afib RVR Pulmonary:     Effort: No tachypnea, accessory muscle usage or respiratory distress.  Comments: Dyspnea at rest, 10L  Skin:    General: Skin is warm and dry.  Neurological:     Mental Status: She is easily aroused.     Comments: Intermittent confusion/lethargy  Psychiatric:        Attention and Perception: She is inattentive.        Speech: Speech is delayed.            Vital Signs: BP (!) 147/102 (BP Location: Right Arm)   Pulse 92   Temp 97.7 F (36.5 C) (Oral)   Resp (!) 28   Ht 4' 9.99" (1.473 m)   Wt (!) 138.1 kg   SpO2 (!) 82%   BMI 63.65 kg/m  SpO2: SpO2: (!) 82 % O2 Device: O2 Device: High Flow Nasal Cannula O2 Flow Rate: O2 Flow Rate (L/min): 15 L/min  Intake/output summary:   Intake/Output Summary (Last 24 hours) at 2019-09-22 1034 Last data filed at Sep 22, 2019 0931 Gross per 24 hour  Intake 3 ml  Output 800 ml  Net -797 ml   LBM: Last BM Date: September 22, 2019 Baseline Weight: Weight: (!) 148.4 kg Most recent weight: Weight: (!) 138.1 kg       Palliative Assessment/Data: PPS 20%      Patient Active Problem List   Diagnosis Date Noted  . Dyspnea   . Atypical pneumonia   . Palliative care by specialist   . Terminal care   . Acute respiratory failure with hypoxia and hypercapnia (HCC)   . Atrial fibrillation ([place])   .  Elevated d-dimer   . Anxiety state   . Nausea   . Acute on chronic respiratory failure with hypoxia ([place]) 08/21/2019  . 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 Intractable epigastric abdominal pain 01/04/2018  . COPD with acute exacerbation ([place]) 11/20/2017  . Morbid obesity with BMI of 50.0-59.9, adult ([place]) 11/20/2017  . Chest pain at rest 11/19/2017  . Varicose veins of bilateral lower extremities with other complications 21/97/5883  . Swelling of limb 10/10/2016  . Spider veins of both lower extremities 10/10/2016  . COPD exacerbation ([place]) 02/06/2016  . Acute respiratory failure with hypoxia ([place]) 02/06/2016  . Hypoxia 02/06/2016  . COPD (chronic obstructive pulmonary disease) ([place])   . Chronic back pain   . Chronic obstructive pulmonary disease ([place])   . Chest pain 06/24/2015  . TIA (transient ischemic attack) 06/24/2015  . UTI (lower urinary tract infection) 06/24/2015  . Difficulty speaking   . Slurred speech   . Skin eruption 01/30/2013  . Obstructive sleep apnea   . Thoracic aortic aneurysm ([place]) 12/17/2012  . Paroxysmal supraventricular tachycardia-nonsustained 12/08/2012  . Morbid obesity ([place]) 12/08/2012  . Asthma, chronic 12/08/2012  . Hypertension 12/08/2012    Palliative Care Assessment & Plan   Patient Profile: 69 y.o. female  with past medical history of COPD on home oxygen 2L [place], OSA noncompliant with CPAP, asthma, GERD, lymphedema, anxiety, depression admitted on 08/24/2019 with shortness of breath, sats 70's at home on oxygen. Hospital admission for acute on chronic combined respiratory failure with elevated d-dimer, obese with sedentary lifestyle. Pt requesting hold off on CT angiogram until tomorrow. Oxygen saturations remain in 80's on HFNC, patient refusing BiPAP. Also with CHF exacerbation receiving IV lasix and afib RVR. Cardiology following. Palliative medicine consultation for goals of care.   Assessment: Acute on chronic respiratory  failure COPD with chronic hypoxemia OSA Atypical pneumonia Acute on chronic diastolic CHF exacerbation New onset atrial fibrillation RVR Anxiety Physical deconditioning/sedentary  lifestyle  Recommendations/Plan:  Initiate morphine infusion for comfort 45m/hr this am. Titrated up to 397mhr this afternoon with ongoing discomfort despite morphine boluses.  RN may bolus morphine via infusion [id number] pain/dyspnea/air hunger/tachypnea  Continue prn ativan, robinul, haldol. Lopressor IV prn HR >130.   Appears she is unstable for transfer to hospice facility with oxygen saturations 49% this afternoon. Continue symptom management inpatient.   Code Status: DNR/DNI   Code Status Orders  (From admission, onward)         Start     Ordered   08/21/19 0042  Do not attempt resuscitation (DNR)  Continuous    Question Answer Comment  In the event of cardiac or respiratory ARREST Do not call a "code blue"   In the event of cardiac or respiratory ARREST Do not perform Intubation, CPR, defibrillation or ACLS   In the event of cardiac or respiratory ARREST Use medication by any route, position, wound care, and other measures to relive pain and suffering. May use oxygen, suction and manual treatment of airway obstruction as needed for comfort.      08/21/19 0041        Code Status History    Date Active Date Inactive Code Status Order ID Comments User Context   09/08/2019 1947 08/21/2019 0041 DNR [id number] [facility] ED   01/04/2018 1022 01/07/2018 1435 DNR [id number] Iba Inpatient   11/20/2017 1617 11/20/2017 2041 DNR [id number] Molder Inpatient   11/19/2017 1210 11/20/2017 1617 Full Code [id number] Molder Inpatient   11/20/2016 1827 11/22/2016 1404 DNR [id number] [facility] ED   02/06/2016 2122 02/09/2016 1806 Full Code [id number] Grout Inpatient   06/24/2015 0216 06/25/2015 2120 Full Code [id number] Millard  ED   06/24/2015 0209 06/24/2015 0216 Full Code [id number] Millard ED   12/08/2012 0024 12/09/2012 2131 Full Code [id number] Agee Inpatient   Advance Care Planning Activity       Prognosis:  Poor prognosis likely hours with chronic respiratory failure secondary to COPD/OSA/OHS. Patient continues to refuse BiPAP with recurrent hypercarbia. Declining functional/cognitive/nutritional status.   Discharge Planning:  Anticipated Hospital Death  Care plan was discussed with daughter (AngLevada Dyamily at bedside, RN, Dr Lai,Billie Ruddy CM  Thank you for allowing the Palliative Medicine Team to assist in the care of this patient.   Time In: [id number] 1500- 1645-   Time Out: [id number] 0388al Time 75mi72molonged Time Billed yes      Greater than 50%  of this time was spent counseling and coordinating care related to the above assessment and plan.  MeganIhor Dow, FNP-C Palliative Medicine Team  Phone: [phone] contact Palliative Medicine Team phone at [phone] and concerns.

## 2019-09-19 NOTE — Discharge Summary (Signed)
Death Summary  Sabrina Curry RWE:[id number] DOB: [birth year] DOA: 31-Aug-2019  PCP: Rosita Fire, MD  Admit date: 2019-08-31 Date of Death: September 07, 2019 Time of Death: 18:00   History of present illness:  Sabrina Curry a 69 y.o.femalewith medical history significant ofasthma, COPD on chronic 2L O2, GERD, lymphedema, anxiety, depression, obstructive sleep apnea who presented to the ER with worsening shortness of breath.Patient had relatively sudden onset shortness of breath needing to be placed on 15 L nonrebreather. Also had worsening BLE edema and abdominal wall edema.  Pt had COPD and obstructive sleep apnea and was supposed to be on CPAP at home but unable to use it due to claustrophobia and adamantly refused it during this hospitalization. She was also a DNR and did not wish any aggressive interventions including intubation.  Pt continued to have high oxygen requirement and hypercapnia with resulting encephalopathy.  Palliative care consulted, and discussed extensively with pt and family, and transitioned pt to comfort care.  Original plan was to discharge to hospice, however, pt's O2 requirement was >10L so was not accepted by hospice.  Pt therefore remained in the hospital, with comfort care measures such as IV morphine for pain and air hunger and ativan for anxiety.  All family members arrived in time to say goodbye.  Pt started having periods of apnea and passed away at 18:00.   Final Diagnoses:  # Respiratory failure with hypoxia and hypercapnia    The results of significant diagnostics from this hospitalization (including imaging, microbiology, ancillary and laboratory) are listed below for reference.    Significant Diagnostic Studies: Ct Angio Chest Pe W Or Wo Contrast  Result Date: 08/23/2019 CLINICAL DATA:  Elevated D-dimer. Shortness of breath. History of thoracic aortic aneurysm. Evaluate for pulmonary embolism. EXAM: CT ANGIOGRAPHY CHEST WITH CONTRAST TECHNIQUE:  Multidetector CT imaging of the chest was performed using the standard protocol during bolus administration of intravenous contrast. Multiplanar CT image reconstructions and MIPs were obtained to evaluate the vascular anatomy. CONTRAST:  167m OMNIPAQUE IOHEXOL 350 MG/ML SOLN COMPARISON:  Chest radiograph 08/22/2019; chest CT-11/20/2016; 03/17/2015 FINDINGS: Examination is degraded due to patient body habitus and associated artifact. Vascular Findings: There is adequate opacification of the pulmonary arterial system with the main pulmonary artery measuring 275 Hounsfield units. There are no discrete filling defects within the pulmonary arterial tree to suggest pulmonary embolism. Marked enlargement of the caliber of the main pulmonary artery measuring 44 mm in diameter (image 32, series 4). Cardiomegaly. No pericardial effusion. Stable fusiform aneurysmal dilatation of the ascending thoracic aorta measuring 44 mm in diameter, similar to the 02/2015 examination. The thoracic aorta tapers to a normal caliber at the level of the aortic arch. No evidence of thoracic aortic dissection or periaortic stranding on this nongated examination. Conventional configuration of the aortic arch. Minimal amount of atherosclerotic plaque within a normal caliber descending thoracic aorta. Review of the MIP images confirms the above findings. ---------------------------------------------------------------------------------- Nonvascular Findings: Mediastinum/Lymph Nodes: No bulky mediastinal, hilar or axillary lymph adenopathy. Lungs/Pleura: Consolidative opacities and associated volume loss and partial atelectasis of the right lower and middle lobes with associated air bronchograms. Bilateral, though right upper lobe predominant, nodular airspace opacities also with scattered associated air bronchograms (image 28, series 6). Small right-sided pleural effusion with fluid tracking within the right major fissure. Upper abdomen: Limited  early arterial phase evaluation upper abdomen suggests hepatomegaly with small amount of perihepatic fluid (image 61, series 7). Additionally, there is a minimal amount of ill-defined mesenteric stranding within the root of  the abdominal mesentery potentially secondary to third spacing. Musculoskeletal: No acute or aggressive osseous abnormalities. Stigmata of DISH within the thoracic spine. IMPRESSION: 1. Bilateral nodular airspace opacities with consolidative opacities involving the right middle and lower lobes with associated air bronchograms worrisome for multifocal infection, including atypical etiologies (such as COVID-19 infection). Clinical correlation is advised. 2. No evidence of pulmonary embolism. 3. Similar findings of cardiomegaly marked enlargement caliber the main artery, nonspecific though could be seen in the setting pulmonary arterial hypertension. 4. Stable uncomplicated fusiform aneurysmal dilatation ascending thoracic aorta measuring 44 mm in diameter. Recommend annual imaging followup by CTA or MRA. This recommendation follows 2010 ACCF/AHA/AATS/ACR/ASA/SCA/SCAI/SIR/STS/SVM Guidelines for the Diagnosis and Management of Patients with Thoracic Aortic Disease. Circulation. 2010; 121: U235-T614. Aortic aneurysm NOS (ICD10-I71.9) 5.  Aortic Atherosclerosis (ICD10-I70.0). 6. Hepatomegaly with trace amount perihepatic ascites. Correlation with LFTs is advised. Electronically Signed   By: Sandi Mariscal M.D.   On: 08/23/2019 11:40   Dg Chest Port 1 View  Result Date: 08/22/2019 CLINICAL DATA:  Hypoxemia EXAM: PORTABLE CHEST 1 VIEW COMPARISON:  09/09/2019 chest radiograph. FINDINGS: Stable cardiomediastinal silhouette with top-normal heart size. No pneumothorax. No pleural effusion. Extensive patchy nodular opacities in the mid to lower lungs bilaterally, similar to slightly worsened. IMPRESSION: Extensive patchy opacities in the mid to lower lungs bilaterally, similar to slightly worsened,  potentially multifocal pneumonia, although underlying pulmonary nodules cannot be excluded. Consider chest CT with IV contrast for further evaluation versus continued chest radiograph follow-up. Electronically Signed   By: Ilona Sorrel M.D.   On: 08/22/2019 17:23   Dg Chest Portable 1 View  Result Date: 08/21/2019 CLINICAL DATA:  Shortness of breath EXAM: PORTABLE CHEST 1 VIEW COMPARISON:  01/05/2018 FINDINGS: Chronic elevation of the right diaphragm. Cardiomegaly with mild central congestion. Patchy airspace opacities bilaterally. No pleural effusion or pneumothorax. IMPRESSION: 1. Patchy bilateral airspace opacities, possible multifocal infection, consider atypical or viral process 2. Cardiomegaly with mild central congestion Electronically Signed   By: Donavan Foil M.D.   On: 09/04/2019 17:18    Microbiology: Recent Results (from the past 240 hour(s))  Blood culture (routine x 2)     Status: None   Collection Time: 09/10/2019  5:37 PM   Specimen: BLOOD RIGHT HAND  Result Value Ref Range Status   Specimen Description BLOOD RIGHT HAND  Final   Special Requests   Final    BOTTLES DRAWN AEROBIC ONLY Blood Culture adequate volume   Culture   Final    NO GROWTH 5 DAYS Performed at [facility], [address] [place], [place] [address]    Report Status 08/25/2019 FINAL  Final  Blood culture (routine x 2)     Status: None   Collection Time: 08/30/2019  5:42 PM   Specimen: BLOOD RIGHT HAND  Result Value Ref Range Status   Specimen Description BLOOD RIGHT HAND  Final   Special Requests   Final    BOTTLES DRAWN AEROBIC ONLY Blood Culture adequate volume   Culture   Final    NO GROWTH 5 DAYS Performed at [facility], [address], [place], [place] [address]    Report Status 08/25/2019 FINAL  Final  SARS Coronavirus 2 by RT PCR (hospital order, performed in [facility] hospital lab) Nasopharyngeal Nasopharyngeal Swab     Status: None   Collection Time: 08/29/2019  5:58 PM   Specimen:  Nasopharyngeal Swab  Result Value Ref Range Status   SARS Coronavirus 2 NEGATIVE NEGATIVE Final  Comment: (NOTE) SARS-CoV-2 target nucleic acids are NOT DETECTED. The SARS-CoV-2 RNA is generally detectable in upper and lower respiratory specimens during the acute phase of infection. The lowest concentration of SARS-CoV-2 viral copies this assay can detect is 250 copies / mL. A negative result does not preclude SARS-CoV-2 infection and should not be used as the sole basis for treatment or other patient management decisions.  A negative result may occur with improper specimen collection / handling, submission of specimen other than nasopharyngeal swab, presence of viral mutation(s) within the areas targeted by this assay, and inadequate number of viral copies (<250 copies / mL). A negative result must be combined with clinical observations, patient history, and epidemiological information. Fact Sheet for Patients:   StrictlyIdeas.no Fact Sheet for Healthcare Providers: BankingDealers.co.za This test is not yet approved or cleared  by the [place] FDA and has been authorized for detection and/or diagnosis of SARS-CoV-2 by FDA under an Emergency Use Authorization (EUA).  This EUA will remain in effect (meaning this test can be used) for the duration of the COVID-19 declaration under Section 564(b)(1) of the Act, 21 U.S.C. section 360bbb-3(b)(1), unless the authorization is terminated or revoked sooner. Performed at [facility], [address] [place], [place] [address]   MRSA PCR Screening     Status: Abnormal   Collection Time: 08/21/19  1:38 AM   Specimen: Nasal Mucosa; Nasopharyngeal  Result Value Ref Range Status   MRSA by PCR POSITIVE (A) NEGATIVE Final    Comment:        The GeneXpert MRSA Assay (FDA approved for NASAL specimens only), is one component of a comprehensive MRSA colonization surveillance program. It is not  intended to diagnose MRSA infection nor to guide or monitor treatment for MRSA infections. RESULT CALLED TO, READ BACK BY AND VERIFIED WITH: LASHLEY S. AT 0816AM ON [id number] BY THOMPSON S. Performed at [facility], [address] [place], [place] [address]   Respiratory Panel by PCR     Status: None   Collection Time: 08/23/19 11:48 AM   Specimen: Nasopharyngeal Swab; Respiratory  Result Value Ref Range Status   Adenovirus NOT DETECTED NOT DETECTED Final   Coronavirus 229E NOT DETECTED NOT DETECTED Final    Comment: (NOTE) The Coronavirus on the Respiratory Panel, DOES NOT test for the novel  Coronavirus (2019 nCoV)    Coronavirus HKU1 NOT DETECTED NOT DETECTED Final   Coronavirus NL63 NOT DETECTED NOT DETECTED Final   Coronavirus OC43 NOT DETECTED NOT DETECTED Final   Metapneumovirus NOT DETECTED NOT DETECTED Final   Rhinovirus / Enterovirus NOT DETECTED NOT DETECTED Final   Influenza A NOT DETECTED NOT DETECTED Final   Influenza B NOT DETECTED NOT DETECTED Final   Parainfluenza Virus 1 NOT DETECTED NOT DETECTED Final   Parainfluenza Virus 2 NOT DETECTED NOT DETECTED Final   Parainfluenza Virus 3 NOT DETECTED NOT DETECTED Final   Parainfluenza Virus 4 NOT DETECTED NOT DETECTED Final   Respiratory Syncytial Virus NOT DETECTED NOT DETECTED Final   Bordetella pertussis NOT DETECTED NOT DETECTED Final   Chlamydophila pneumoniae NOT DETECTED NOT DETECTED Final   Mycoplasma pneumoniae NOT DETECTED NOT DETECTED Final    Comment: Performed at [facility], [place] [address], [place], [place] [address]  SARS CORONAVIRUS 2 (TAT 6-24 HRS) Nasopharyngeal Nasopharyngeal Swab     Status: None   Collection Time: 08/25/19  3:08 PM   Specimen: Nasopharyngeal Swab  Result Value Ref Range Status   SARS Coronavirus 2  NEGATIVE NEGATIVE Final    Comment: (NOTE) SARS-CoV-2 target nucleic acids are NOT DETECTED. The SARS-CoV-2 RNA is generally detectable in upper and lower respiratory specimens  during the acute phase of infection. Negative results do not preclude SARS-CoV-2 infection, do not rule out co-infections with other pathogens, and should not be used as the sole basis for treatment or other patient management decisions. Negative results must be combined with clinical observations, patient history, and epidemiological information. The expected result is Negative. Fact Sheet for Patients: SugarRoll.be Fact Sheet for Healthcare Providers: https://www.woods-mathews.com/ This test is not yet approved or cleared by the [place] FDA and  has been authorized for detection and/or diagnosis of SARS-CoV-2 by FDA under an Emergency Use Authorization (EUA). This EUA will remain  in effect (meaning this test can be used) for the duration of the COVID-19 declaration under Section 56 4(b)(1) of the Act, 21 U.S.C. section 360bbb-3(b)(1), unless the authorization is terminated or revoked sooner. Performed at [facility], [place] [address] [place], [place] [address]      Labs: Basic Metabolic Panel: Recent Labs  Lab 08/21/19 [phone] 08/22/19 0526 08/23/19 0756 08/24/19 0600  NA 144 143 141 141  K 4.4 4.3 4.6 4.0  CL 94* 89* 85* 80*  CO2 40* 41* 49* 46*  GLUCOSE 139* 123* 121* 123*  BUN 21 25* 28* 36*  CREATININE 1.09* 1.10* 1.15* 1.20*  CALCIUM 8.9 9.1 9.1 9.2  MG  --  1.9 1.9  --    Liver Function Tests: No results for input(s): AST, ALT, ALKPHOS, BILITOT, PROT, ALBUMIN in the last 168 hours. No results for input(s): LIPASE, AMYLASE in the last 168 hours. No results for input(s): AMMONIA in the last 168 hours. CBC: Recent Labs  Lab 08/21/19 0649 08/22/19 0526 08/23/19 0756 08/24/19 0600 08/25/19 0440  WBC 8.8 7.5 7.1 5.3 6.4  HGB 11.1* 10.9* 12.3 11.1* 11.4*  HCT 39.2 38.2 44.4 39.1 38.8  MCV 111.4* 110.4* 113.3* 110.1* 106.6*  PLT 124* 119* 118* 117* 130*   Cardiac Enzymes: No results for input(s): CKTOTAL,  CKMB, CKMBINDEX, TROPONINI in the last 168 hours. D-Dimer No results for input(s): DDIMER in the last 72 hours. BNP: Invalid input(s): POCBNP CBG: No results for input(s): GLUCAP in the last 168 hours. Anemia work up No results for input(s): VITAMINB12, FOLATE, FERRITIN, TIBC, IRON, RETICCTPCT in the last 72 hours. Urinalysis    Component Value Date/Time   COLORURINE YELLOW 01/04/2018 0209   APPEARANCEUR CLEAR 01/04/2018 0209   LABSPEC 1.012 01/04/2018 0209   PHURINE 7.0 01/04/2018 0209   GLUCOSEU NEGATIVE 01/04/2018 0209   HGBUR NEGATIVE 01/04/2018 0209   BILIRUBINUR NEGATIVE 01/04/2018 0209   KETONESUR NEGATIVE 01/04/2018 0209   PROTEINUR 30 (A) 01/04/2018 0209   UROBILINOGEN 0.2 06/23/2015 2350   NITRITE NEGATIVE 01/04/2018 0209   LEUKOCYTESUR NEGATIVE 01/04/2018 0209   Sepsis Labs Invalid input(s): PROCALCITONIN,  WBC,  LACTICIDVEN   Enzo Bi, MD  [facility] 09-12-19, 7:07 PM  If 7PM-7AM, please contact night-coverage

## 2019-09-19 NOTE — Progress Notes (Signed)
Per family, pt states that she is having "heart pain", daughter has SaO2 probe on patient's finger and states when patient's heartrate goes up to 130 she will complain of "heart pain". Pt given 40m bolus of Morphine per order earlier, which initially seemed to help relieve the pain. Palliative NP Megan notified of pt condition. Additional orders noted for heart rate control prn. Ativan 0.589mIV given just now for patient's anxiety relief as daughter states patient is more restless. Current heartrate 90/min apical, irregular. SaO2 registers as 59%, but patient alert, skin warm and dry, no cyanosis noted and resp non-labored. Advised daughter to update me on pt's condition and response to Ativan in 15 minutes and we will reassess for need for more pain medication. Fan provided at bedside for patient comfort.

## 2019-09-19 NOTE — Progress Notes (Signed)
Family states pt with no real relief from ativan. Pt given 4m Morphine IV for complaint of pain "all over" and increased restless and agitation. Pt states, "I can't breathe, I can't breathe!". Lips, fingertips and toes dusky in color now, cap refill > 3 sec. SaO2 registers at 49%, heart rate 85 - 128 bpm, irregular. Pt staes, "I'm tired, I'm tired of everything". Family attentive at bedside.

## 2019-09-19 DEATH — deceased
